# Patient Record
Sex: Female | Born: 1940 | Race: White | Hispanic: No | Marital: Married | Smoking: Former smoker
Health system: Southern US, Community
[De-identification: ages and names within clinical notes are randomized; demographics above are authoritative.]

## PROBLEM LIST (undated history)

## (undated) DIAGNOSIS — I10 Essential (primary) hypertension: Secondary | ICD-10-CM

## (undated) DIAGNOSIS — E119 Type 2 diabetes mellitus without complications: Secondary | ICD-10-CM

## (undated) DIAGNOSIS — Z8619 Personal history of other infectious and parasitic diseases: Secondary | ICD-10-CM

## (undated) DIAGNOSIS — D649 Anemia, unspecified: Secondary | ICD-10-CM

## (undated) DIAGNOSIS — K635 Polyp of colon: Secondary | ICD-10-CM

## (undated) DIAGNOSIS — F329 Major depressive disorder, single episode, unspecified: Secondary | ICD-10-CM

## (undated) DIAGNOSIS — R911 Solitary pulmonary nodule: Secondary | ICD-10-CM

## (undated) DIAGNOSIS — L570 Actinic keratosis: Secondary | ICD-10-CM

## (undated) DIAGNOSIS — C44621 Squamous cell carcinoma of skin of unspecified upper limb, including shoulder: Secondary | ICD-10-CM

## (undated) DIAGNOSIS — K219 Gastro-esophageal reflux disease without esophagitis: Secondary | ICD-10-CM

## (undated) DIAGNOSIS — K649 Unspecified hemorrhoids: Secondary | ICD-10-CM

## (undated) DIAGNOSIS — J449 Chronic obstructive pulmonary disease, unspecified: Secondary | ICD-10-CM

## (undated) DIAGNOSIS — C44629 Squamous cell carcinoma of skin of left upper limb, including shoulder: Secondary | ICD-10-CM

## (undated) HISTORY — DX: Anemia, unspecified: D64.9

## (undated) HISTORY — DX: Polyp of colon: K63.5

## (undated) HISTORY — DX: Actinic keratosis: L57.0

## (undated) HISTORY — DX: Unspecified hemorrhoids: K64.9

## (undated) HISTORY — DX: Chronic obstructive pulmonary disease, unspecified: J44.9

## (undated) HISTORY — PX: OTHER SURGICAL HISTORY: SHX169

## (undated) HISTORY — DX: Major depressive disorder, single episode, unspecified: F32.9

## (undated) HISTORY — DX: Gastro-esophageal reflux disease without esophagitis: K21.9

## (undated) HISTORY — DX: Personal history of other infectious and parasitic diseases: Z86.19

## (undated) HISTORY — DX: Squamous cell carcinoma of skin of unspecified upper limb, including shoulder: C44.621

## (undated) HISTORY — DX: Squamous cell carcinoma of skin of left upper limb, including shoulder: C44.629

## (undated) HISTORY — DX: Solitary pulmonary nodule: R91.1

---

## 1969-05-18 HISTORY — PX: TUBAL LIGATION: SHX77

## 1983-05-19 HISTORY — PX: ABDOMINAL HYSTERECTOMY: SHX81

## 1992-05-18 HISTORY — PX: GALLBLADDER SURGERY: SHX652

## 1996-05-18 HISTORY — PX: NECK SURGERY: SHX720

## 2007-07-08 DIAGNOSIS — F329 Major depressive disorder, single episode, unspecified: Secondary | ICD-10-CM | POA: Insufficient documentation

## 2007-07-08 DIAGNOSIS — F32A Depression, unspecified: Secondary | ICD-10-CM

## 2007-07-08 DIAGNOSIS — I10 Essential (primary) hypertension: Secondary | ICD-10-CM | POA: Insufficient documentation

## 2007-07-08 DIAGNOSIS — M519 Unspecified thoracic, thoracolumbar and lumbosacral intervertebral disc disorder: Secondary | ICD-10-CM | POA: Insufficient documentation

## 2007-07-08 DIAGNOSIS — F17210 Nicotine dependence, cigarettes, uncomplicated: Secondary | ICD-10-CM | POA: Insufficient documentation

## 2007-07-08 HISTORY — DX: Depression, unspecified: F32.A

## 2007-07-08 HISTORY — DX: Major depressive disorder, single episode, unspecified: F32.9

## 2007-07-14 ENCOUNTER — Ambulatory Visit: Payer: Self-pay | Admitting: Family Medicine

## 2007-11-10 ENCOUNTER — Ambulatory Visit: Payer: Self-pay | Admitting: Family Medicine

## 2008-02-16 HISTORY — PX: CARDIOVASCULAR STRESS TEST: SHX262

## 2008-03-15 ENCOUNTER — Ambulatory Visit: Payer: Self-pay | Admitting: Family Medicine

## 2008-05-18 HISTORY — PX: HAND SURGERY: SHX662

## 2008-07-05 ENCOUNTER — Ambulatory Visit: Payer: Self-pay | Admitting: Family Medicine

## 2008-07-19 ENCOUNTER — Ambulatory Visit: Payer: Self-pay | Admitting: Specialist

## 2009-07-16 HISTORY — PX: ESOPHAGOGASTRODUODENOSCOPY: SHX1529

## 2009-07-18 LAB — HM COLONOSCOPY

## 2009-08-15 ENCOUNTER — Ambulatory Visit: Payer: Self-pay | Admitting: Gastroenterology

## 2010-01-16 HISTORY — PX: DOPPLER ECHOCARDIOGRAPHY: SHX263

## 2010-02-07 ENCOUNTER — Ambulatory Visit: Payer: Self-pay | Admitting: Cardiovascular Disease

## 2010-02-07 ENCOUNTER — Ambulatory Visit: Payer: Self-pay

## 2010-06-16 ENCOUNTER — Ambulatory Visit: Payer: Self-pay | Admitting: Family Medicine

## 2010-07-30 ENCOUNTER — Ambulatory Visit: Payer: Self-pay | Admitting: Family Medicine

## 2010-08-14 ENCOUNTER — Ambulatory Visit: Payer: Self-pay | Admitting: Family Medicine

## 2010-08-17 ENCOUNTER — Ambulatory Visit: Payer: Self-pay | Admitting: Oncology

## 2010-09-09 LAB — CANCER ANTIGEN 19-9: CA 19-9: <1 U/mL (ref 0–35)

## 2010-09-16 ENCOUNTER — Ambulatory Visit: Payer: Self-pay | Admitting: Oncology

## 2010-12-02 ENCOUNTER — Ambulatory Visit: Payer: Self-pay | Admitting: Oncology

## 2010-12-04 ENCOUNTER — Ambulatory Visit: Payer: Self-pay | Admitting: Oncology

## 2010-12-17 ENCOUNTER — Ambulatory Visit: Payer: Self-pay | Admitting: Oncology

## 2011-02-10 ENCOUNTER — Ambulatory Visit: Payer: Self-pay | Admitting: Family Medicine

## 2011-02-10 LAB — HM DEXA SCAN

## 2012-01-01 ENCOUNTER — Ambulatory Visit: Payer: Self-pay | Admitting: Family Medicine

## 2012-01-06 ENCOUNTER — Ambulatory Visit: Payer: Self-pay | Admitting: Family Medicine

## 2012-10-02 ENCOUNTER — Other Ambulatory Visit: Payer: Self-pay | Admitting: Family Medicine

## 2012-10-10 ENCOUNTER — Other Ambulatory Visit: Payer: Self-pay | Admitting: Family Medicine

## 2013-10-27 DIAGNOSIS — E559 Vitamin D deficiency, unspecified: Secondary | ICD-10-CM | POA: Insufficient documentation

## 2014-04-28 ENCOUNTER — Emergency Department (HOSPITAL_COMMUNITY)
Admission: EM | Admit: 2014-04-28 | Discharge: 2014-04-28 | Disposition: A | Discharge status: Home / Self Care | Payer: Medicare Other | Attending: Emergency Medicine | Admitting: Emergency Medicine

## 2014-04-28 ENCOUNTER — Encounter (HOSPITAL_COMMUNITY): Payer: Self-pay

## 2014-04-28 ENCOUNTER — Emergency Department (HOSPITAL_COMMUNITY): Payer: Medicare Other

## 2014-04-28 DIAGNOSIS — M545 Low back pain, unspecified: Secondary | ICD-10-CM

## 2014-04-28 DIAGNOSIS — Y998 Other external cause status: Secondary | ICD-10-CM | POA: Insufficient documentation

## 2014-04-28 DIAGNOSIS — E785 Hyperlipidemia, unspecified: Secondary | ICD-10-CM | POA: Insufficient documentation

## 2014-04-28 DIAGNOSIS — Z720 Tobacco use: Secondary | ICD-10-CM | POA: Diagnosis not present

## 2014-04-28 DIAGNOSIS — W19XXXA Unspecified fall, initial encounter: Secondary | ICD-10-CM

## 2014-04-28 DIAGNOSIS — S3992XA Unspecified injury of lower back, initial encounter: Secondary | ICD-10-CM | POA: Insufficient documentation

## 2014-04-28 DIAGNOSIS — Z79899 Other long term (current) drug therapy: Secondary | ICD-10-CM | POA: Diagnosis not present

## 2014-04-28 DIAGNOSIS — Z7982 Long term (current) use of aspirin: Secondary | ICD-10-CM | POA: Diagnosis not present

## 2014-04-28 DIAGNOSIS — I10 Essential (primary) hypertension: Secondary | ICD-10-CM | POA: Diagnosis not present

## 2014-04-28 DIAGNOSIS — Y9289 Other specified places as the place of occurrence of the external cause: Secondary | ICD-10-CM | POA: Insufficient documentation

## 2014-04-28 DIAGNOSIS — W01198A Fall on same level from slipping, tripping and stumbling with subsequent striking against other object, initial encounter: Secondary | ICD-10-CM | POA: Insufficient documentation

## 2014-04-28 DIAGNOSIS — E119 Type 2 diabetes mellitus without complications: Secondary | ICD-10-CM | POA: Insufficient documentation

## 2014-04-28 DIAGNOSIS — Y9389 Activity, other specified: Secondary | ICD-10-CM | POA: Diagnosis not present

## 2014-04-28 HISTORY — DX: Essential (primary) hypertension: I10

## 2014-04-28 HISTORY — DX: Type 2 diabetes mellitus without complications: E11.9

## 2014-04-28 LAB — URINALYSIS, ROUTINE W REFLEX MICROSCOPIC
BILIRUBIN URINE: NEGATIVE
Glucose, UA: NEGATIVE mg/dL
Hgb urine dipstick: NEGATIVE
Ketones, ur: NEGATIVE mg/dL
LEUKOCYTES UA: NEGATIVE
NITRITE: NEGATIVE
PH: 5.5 (ref 5.0–8.0)
Protein, ur: NEGATIVE mg/dL
SPECIFIC GRAVITY, URINE: 1.02 (ref 1.005–1.030)
UROBILINOGEN UA: 0.2 mg/dL (ref 0.0–1.0)

## 2014-04-28 MED ORDER — HYDROCODONE-ACETAMINOPHEN 5-325 MG PO TABS
1.0000 | ORAL_TABLET | Freq: Once | ORAL | Status: AC
  Administered 2014-04-28: 1 via ORAL
  Filled 2014-04-28: qty 1

## 2014-04-28 MED ORDER — HYDROCODONE-ACETAMINOPHEN 5-325 MG PO TABS: 2.0000 | ORAL_TABLET | ORAL | Status: DC | PRN

## 2014-04-28 NOTE — ED Notes (Signed)
Ambulated patient. Tolerated well. Stated that she felt fine while walking.

## 2014-04-28 NOTE — ED Notes (Signed)
Pt ambulated 20-30 feet. Asymptomatic. Marland Kitchen

## 2014-04-28 NOTE — Discharge Instructions (Signed)
Fall Prevention and Home Safety Your Xray shows a small compression in your back.  Follow up with your doctor. Return to the ED if you develop new or worsening symptoms. Falls cause injuries and can affect all age groups. It is possible to use preventive measures to significantly decrease the likelihood of falls. There are many simple measures which can make your home safer and prevent falls. OUTDOORS  Repair cracks and edges of walkways and driveways.  Remove high doorway thresholds.  Trim shrubbery on the main path into your home.  Have good outside lighting.  Clear walkways of tools, rocks, debris, and clutter.  Check that handrails are not broken and are securely fastened. Both sides of steps should have handrails.  Have leaves, snow, and ice cleared regularly.  Use sand or salt on walkways during winter months.  In the garage, clean up grease or oil spills. BATHROOM  Install night lights.  Install grab bars by the toilet and in the tub and shower.  Use non-skid mats or decals in the tub or shower.  Place a plastic non-slip stool in the shower to sit on, if needed.  Keep floors dry and clean up all water on the floor immediately.  Remove soap buildup in the tub or shower on a regular basis.  Secure bath mats with non-slip, double-sided rug tape.  Remove throw rugs and tripping hazards from the floors. BEDROOMS  Install night lights.  Make sure a bedside light is easy to reach.  Do not use oversized bedding.  Keep a telephone by your bedside.  Have a firm chair with side arms to use for getting dressed.  Remove throw rugs and tripping hazards from the floor. KITCHEN  Keep handles on pots and pans turned toward the center of the stove. Use back burners when possible.  Clean up spills quickly and allow time for drying.  Avoid walking on wet floors.  Avoid hot utensils and knives.  Position shelves so they are not too high or low.  Place commonly used  objects within easy reach.  If necessary, use a sturdy step stool with a grab bar when reaching.  Keep electrical cables out of the way.  Do not use floor polish or wax that makes floors slippery. If you must use wax, use non-skid floor wax.  Remove throw rugs and tripping hazards from the floor. STAIRWAYS  Never leave objects on stairs.  Place handrails on both sides of stairways and use them. Fix any loose handrails. Make sure handrails on both sides of the stairways are as long as the stairs.  Check carpeting to make sure it is firmly attached along stairs. Make repairs to worn or loose carpet promptly.  Avoid placing throw rugs at the top or bottom of stairways, or properly secure the rug with carpet tape to prevent slippage. Get rid of throw rugs, if possible.  Have an electrician put in a light switch at the top and bottom of the stairs. OTHER FALL PREVENTION TIPS  Wear low-heel or rubber-soled shoes that are supportive and fit well. Wear closed toe shoes.  When using a stepladder, make sure it is fully opened and both spreaders are firmly locked. Do not climb a closed stepladder.  Add color or contrast paint or tape to grab bars and handrails in your home. Place contrasting color strips on first and last steps.  Learn and use mobility aids as needed. Install an electrical emergency response system.  Turn on lights to avoid dark  areas. Replace light bulbs that burn out immediately. Get light switches that glow.  Arrange furniture to create clear pathways. Keep furniture in the same place.  Firmly attach carpet with non-skid or double-sided tape.  Eliminate uneven floor surfaces.  Select a carpet pattern that does not visually hide the edge of steps.  Be aware of all pets. OTHER HOME SAFETY TIPS  Set the water temperature for 120 F (48.8 C).  Keep emergency numbers on or near the telephone.  Keep smoke detectors on every level of the home and near sleeping  areas. Document Released: 04/24/2002 Document Revised: 11/03/2011 Document Reviewed: 07/24/2011 [facility] Patient Information 2015 [place], [place]. This information is not intended to replace advice given to you by your health care provider. Make sure you discuss any questions you have with your health care provider.

## 2014-04-28 NOTE — ED Notes (Signed)
Pt was at an [employer], had taken her things out to her car and when she returned, she turned around quick and lost her balance and fell.  Pt c/o pain to left hip and under left posterior ribcage.  Pt denies hitting her head or having loc.

## 2014-04-28 NOTE — ED Provider Notes (Signed)
CSN: [id number]     Arrival date & time 04/28/14  2040 History   First MD Initiated Contact with Patient 04/28/14 2100     Chief Complaint  Patient presents with  . Fall     (Consider location/radiation/quality/duration/timing/severity/associated sxs/prior Treatment) HPI Comments: Patient complains of left sided hip pain after fall. She states she lost her balance and fell as she was trying to pick up a piece of trash. She hit her left hip onto a table and then onto the ground. Denies hitting her head or losing consciousness. Denies any dizziness or syncope. Denies any chest pain or shortness of breath. Complains of pain to left low back and left iliac crest. Pain does not radiate down her leg. No incontinence. No weakness numbness or tingling. No chest pain or abdominal pain. No head or neck pain. She is not taking anything for pain.   The history is provided by the patient and the EMS personnel.    Past Medical History  Diagnosis Date  . Hypertension   . Diabetes mellitus without complication   . Hyperlipidemia    History reviewed. No pertinent past surgical history. No family history on file. History  Substance Use Topics  . Smoking status: Current Every Day Smoker  . Smokeless tobacco: Not on file  . Alcohol Use: No   OB History    No data available     Review of Systems  Constitutional: Negative for fever, activity change and appetite change.  Respiratory: Negative for cough, chest tightness and shortness of breath.   Gastrointestinal: Negative for nausea, vomiting and abdominal pain.  Genitourinary: Negative for dysuria, hematuria, vaginal bleeding and vaginal discharge.  Musculoskeletal: Positive for myalgias, back pain and arthralgias. Negative for neck pain and neck stiffness.  Skin: Negative for wound.  Neurological: Negative for dizziness, weakness and headaches.  A complete 10 system review of systems was obtained and all systems are negative except as noted in  the HPI and PMH.      Allergies  Review of patient's allergies indicates no known allergies.  Home Medications   Prior to Admission medications   Medication Sig Start Date End Date Taking? Authorizing Provider  aspirin EC 81 MG tablet Take 81 mg by mouth daily.   Yes Historical Provider, MD  atenolol (TENORMIN) 50 MG tablet Take 50 mg by mouth 2 (two) times daily. 02/13/14  Yes Historical Provider, MD  atorvastatin (LIPITOR) 10 MG tablet Take 10 mg by mouth at bedtime. 02/26/14  Yes Historical Provider, MD  Cholecalciferol (VITAMIN D) 2000 UNITS tablet Take 2,000 Units by mouth daily.   Yes Historical Provider, MD  Enalapril-Hydrochlorothiazide 5-12.5 MG per tablet Take 1 tablet by mouth daily. 04/20/14  Yes Historical Provider, MD  glipiZIDE (GLUCOTROL XL) 10 MG 24 hr tablet Take 10 mg by mouth daily. 02/26/14  Yes Historical Provider, MD  metFORMIN (GLUCOPHAGE) 1000 MG tablet Take 1,000 mg by mouth 2 (two) times daily. 02/19/14  Yes Historical Provider, MD  omeprazole (PRILOSEC) 20 MG capsule Take 20 mg by mouth 2 (two) times daily. 02/12/14  Yes Historical Provider, MD  oxyCODONE-acetaminophen (PERCOCET/ROXICET) 5-325 MG per tablet Take 1 tablet by mouth 2 (two) times daily as needed. For pain 02/17/14  Yes Historical Provider, MD  PARoxetine (PAXIL) 10 MG tablet Take 10 mg by mouth daily. Takes a total of 30mg  daily 03/22/14  Yes Historical Provider, MD  PARoxetine (PAXIL) 20 MG tablet Take 20 mg by mouth daily. 03/22/14  Yes Historical Provider, MD  PROAIR HFA 108 (90 BASE) MCG/ACT inhaler Inhale 2 puffs into the lungs every 6 (six) hours as needed for wheezing or shortness of breath.  04/04/14  Yes Historical Provider, MD  SYMBICORT 160-4.5 MCG/ACT inhaler Inhale 2 puffs into the lungs 2 (two) times daily. 04/04/14  Yes Historical Provider, MD  HYDROcodone-acetaminophen (NORCO/VICODIN) 5-325 MG per tablet Take 2 tablets by mouth every 4 (four) hours as needed. 04/28/14   Ezequiel Essex, MD    BP 155/79 mmHg  Pulse 97  Temp(Src) 98.7 F (37.1 C) (Oral)  Resp 18  Ht 5\' 7"  (1.702 m)  Wt 183 lb (83.008 kg)  BMI 28.66 kg/m2  SpO2 95% Physical Exam  Constitutional: She is oriented to person, place, and time. She appears well-developed and well-nourished. No distress.  HENT:  Head: Normocephalic and atraumatic.  Mouth/Throat: Oropharynx is clear and moist. No oropharyngeal exudate.  Eyes: Conjunctivae and EOM are normal. Pupils are equal, round, and reactive to light.  Neck: Normal range of motion. Neck supple.  No C spine tenderness  Cardiovascular: Normal rate, regular rhythm, normal heart sounds and intact distal pulses.   No murmur heard. Pulmonary/Chest: Effort normal and breath sounds normal. No respiratory distress.  Abdominal: Soft. There is no tenderness. There is no rebound and no guarding.  Musculoskeletal: Normal range of motion. She exhibits tenderness. She exhibits no edema.  TTP L iliac crest, lumbar spine. No stepoff, Intact distal pulses 5/5 strength in bilateral lower extremities. Ankle plantar and dorsiflexion intact. Great toe extension intact bilaterally. +2 DP and PT pulses. +2 patellar reflexes bilaterally. Normal gait.   Neurological: She is alert and oriented to person, place, and time. No cranial nerve deficit. She exhibits normal muscle tone. Coordination normal.  No ataxia on finger to nose bilaterally. No pronator drift. 5/5 strength throughout. CN 2-12 intact. Negative Romberg. Equal grip strength. Sensation intact. Gait is normal.   Skin: Skin is warm.  Psychiatric: She has a normal mood and affect. Her behavior is normal.  Nursing note and vitals reviewed.   ED Course  Procedures (including critical care time) Labs Review Labs Reviewed  URINALYSIS, ROUTINE W REFLEX MICROSCOPIC - Abnormal; Notable for the following:    APPearance CLOUDY (*)    All other components within normal limits    Imaging Review Dg Chest 2 View  04/28/2014    CLINICAL DATA:  [place] tonight around 8 p.m. while retrieving things from patient's car, low back, LEFT hip and chest pain.  EXAM: CHEST  2 VIEW  COMPARISON:  None.  FINDINGS: The cardiac silhouette is upper limits of normal in size, mediastinal silhouette is unremarkable. No pleural effusions or focal consolidations no pneumothorax. Soft tissue planes and included osseous structures are nonsuspicious. Chronic appearing RIGHT fourth and fifth lateral rib fractures.  IMPRESSION: Borderline cardiomegaly, no acute pulmonary process.   Electronically Signed   By: Elon Alas   On: 04/28/2014 21:54   Dg Lumbar Spine Complete  04/28/2014   CLINICAL DATA:  [place] tonight around 8 p.m. while retrieving things from patient's car, low back, LEFT hip and chest pain.  EXAM: LUMBAR SPINE - COMPLETE 4+ VIEW  COMPARISON:  None.  FINDINGS: Mild wedging of vertebral body L1. The remaining lumbar vertebral bodies appear intact. Scattered Schmorl's nodes. At least moderate L4-5 and L5-S1 degenerative discs. Mild lower lumbar facet arthropathy. No pars interarticularis defects. No destructive bony lesions. Surgical clips in the included right abdomen likely reflect cholecystectomy. Moderate aortoiliac atherosclerosis calcifications.  IMPRESSION: Mild wedging  of vertebral body L1 could reflect an acute injury, no retropulsed bony fragments or malalignment.   Electronically Signed   By: Elon Alas   On: 04/28/2014 21:53   Dg Hip Complete Left  04/28/2014   CLINICAL DATA:  [place] tonight with left hip pain, initial encounter  EXAM: LEFT HIP - COMPLETE 2+ VIEW  COMPARISON:  None.  FINDINGS: Mild degenerative changes of the hip joint are noted. No acute fracture or dislocation is seen. No gross soft tissue abnormality is noted.  IMPRESSION: No acute abnormality seen.   Electronically Signed   By: Inez Catalina M.D.   On: 04/28/2014 21:45     EKG Interpretation   Date/Time:  Saturday April 28 2014 21:15:49  EST Ventricular Rate:  84 PR Interval:  200 QRS Duration: 86 QT Interval:  377 QTC Calculation: 446 R Axis:   52 Text Interpretation:  Sinus rhythm No previous ECGs available Confirmed by  Jonea Bukowski  MD, Yalissa Fink [phone]) on 04/28/2014 9:51:06 PM      MDM   Final diagnoses:  Fall  Midline low back pain without sciatica   Mechanical fall with L sided back and hip pain. Did not hit head or lose consciousness. No chest pain or shortness of breath. No syncope.  Neurologically intact.   Xrays negative for fracture. Except mild L1 wedge deformity, uncertain chroncity. Patient ambulatory.  Tolerating PO.  No dizziness or lightheadedness with standing.   Patient states she has been told she had compression in her back previously. Pain controlled in the ED. Treat pain. Follow-up with PCP. Return precautions discussed.  I personally performed the services described in this documentation, which was scribed in my presence. The recorded information has been reviewed and is accurate.   Ezequiel Essex, MD 04/28/14 [phone]

## 2014-06-28 DIAGNOSIS — E1140 Type 2 diabetes mellitus with diabetic neuropathy, unspecified: Secondary | ICD-10-CM | POA: Diagnosis not present

## 2014-06-28 DIAGNOSIS — Z720 Tobacco use: Secondary | ICD-10-CM | POA: Diagnosis not present

## 2014-06-28 DIAGNOSIS — M519 Unspecified thoracic, thoracolumbar and lumbosacral intervertebral disc disorder: Secondary | ICD-10-CM | POA: Diagnosis not present

## 2014-06-28 DIAGNOSIS — J449 Chronic obstructive pulmonary disease, unspecified: Secondary | ICD-10-CM | POA: Diagnosis not present

## 2014-06-28 DIAGNOSIS — I10 Essential (primary) hypertension: Secondary | ICD-10-CM | POA: Diagnosis not present

## 2014-06-28 LAB — HEMOGLOBIN A1C: HEMOGLOBIN A1C: 6.5 % — AB (ref 4.0–6.0)

## 2014-07-26 ENCOUNTER — Ambulatory Visit: Payer: Self-pay | Admitting: Family Medicine

## 2014-07-26 DIAGNOSIS — R0602 Shortness of breath: Secondary | ICD-10-CM | POA: Diagnosis not present

## 2014-07-26 DIAGNOSIS — R05 Cough: Secondary | ICD-10-CM | POA: Diagnosis not present

## 2014-07-26 DIAGNOSIS — J440 Chronic obstructive pulmonary disease with acute lower respiratory infection: Secondary | ICD-10-CM | POA: Diagnosis not present

## 2014-07-26 DIAGNOSIS — J449 Chronic obstructive pulmonary disease, unspecified: Secondary | ICD-10-CM | POA: Diagnosis not present

## 2014-07-26 DIAGNOSIS — H6591 Unspecified nonsuppurative otitis media, right ear: Secondary | ICD-10-CM | POA: Diagnosis not present

## 2014-08-02 DIAGNOSIS — H6591 Unspecified nonsuppurative otitis media, right ear: Secondary | ICD-10-CM | POA: Diagnosis not present

## 2014-08-02 DIAGNOSIS — D72828 Other elevated white blood cell count: Secondary | ICD-10-CM | POA: Diagnosis not present

## 2014-08-02 DIAGNOSIS — J440 Chronic obstructive pulmonary disease with acute lower respiratory infection: Secondary | ICD-10-CM | POA: Diagnosis not present

## 2014-08-02 DIAGNOSIS — J449 Chronic obstructive pulmonary disease, unspecified: Secondary | ICD-10-CM | POA: Diagnosis not present

## 2014-08-10 DIAGNOSIS — J449 Chronic obstructive pulmonary disease, unspecified: Secondary | ICD-10-CM | POA: Diagnosis not present

## 2014-08-10 DIAGNOSIS — J440 Chronic obstructive pulmonary disease with acute lower respiratory infection: Secondary | ICD-10-CM | POA: Diagnosis not present

## 2014-08-10 DIAGNOSIS — D649 Anemia, unspecified: Secondary | ICD-10-CM | POA: Diagnosis not present

## 2014-08-10 DIAGNOSIS — R634 Abnormal weight loss: Secondary | ICD-10-CM | POA: Diagnosis not present

## 2014-08-10 DIAGNOSIS — D72828 Other elevated white blood cell count: Secondary | ICD-10-CM | POA: Diagnosis not present

## 2014-08-31 DIAGNOSIS — D72828 Other elevated white blood cell count: Secondary | ICD-10-CM | POA: Diagnosis not present

## 2014-08-31 LAB — BASIC METABOLIC PANEL
BUN: 20 mg/dL (ref 4–21)
CREATININE: 1.4 mg/dL — AB (ref 0.5–1.1)
Glucose: 148 mg/dL
POTASSIUM: 4.6 mmol/L (ref 3.4–5.3)
SODIUM: 139 mmol/L (ref 137–147)

## 2014-08-31 LAB — HEPATIC FUNCTION PANEL
ALT: 14 U/L (ref 7–35)
AST: 18 U/L (ref 13–35)

## 2014-10-08 DIAGNOSIS — E1149 Type 2 diabetes mellitus with other diabetic neurological complication: Secondary | ICD-10-CM | POA: Diagnosis not present

## 2014-10-08 DIAGNOSIS — D649 Anemia, unspecified: Secondary | ICD-10-CM | POA: Diagnosis not present

## 2014-10-08 DIAGNOSIS — R634 Abnormal weight loss: Secondary | ICD-10-CM | POA: Diagnosis not present

## 2014-10-08 DIAGNOSIS — M519 Unspecified thoracic, thoracolumbar and lumbosacral intervertebral disc disorder: Secondary | ICD-10-CM | POA: Diagnosis not present

## 2014-10-08 DIAGNOSIS — G4700 Insomnia, unspecified: Secondary | ICD-10-CM | POA: Diagnosis not present

## 2014-10-08 DIAGNOSIS — J449 Chronic obstructive pulmonary disease, unspecified: Secondary | ICD-10-CM | POA: Diagnosis not present

## 2014-10-08 LAB — CBC AND DIFFERENTIAL
HCT: 30 % — AB (ref 36–46)
HEMOGLOBIN: 9.7 g/dL — AB (ref 12.0–16.0)
Platelets: 325 10*3/uL (ref 150–399)
WBC: 13.5 10^3/mL

## 2014-10-25 ENCOUNTER — Other Ambulatory Visit: Payer: Self-pay | Admitting: *Deleted

## 2014-10-26 ENCOUNTER — Other Ambulatory Visit: Payer: Self-pay | Admitting: Family Medicine

## 2014-10-26 DIAGNOSIS — Z1231 Encounter for screening mammogram for malignant neoplasm of breast: Secondary | ICD-10-CM

## 2014-10-30 ENCOUNTER — Ambulatory Visit
Admission: RE | Admit: 2014-10-30 | Discharge: 2014-10-30 | Disposition: A | Discharge status: Home / Self Care | Payer: Medicare Other | Source: Ambulatory Visit | Attending: Family Medicine | Admitting: Family Medicine

## 2014-10-30 ENCOUNTER — Other Ambulatory Visit: Payer: Self-pay | Admitting: Family Medicine

## 2014-10-30 DIAGNOSIS — R921 Mammographic calcification found on diagnostic imaging of breast: Secondary | ICD-10-CM

## 2014-10-30 DIAGNOSIS — R928 Other abnormal and inconclusive findings on diagnostic imaging of breast: Secondary | ICD-10-CM

## 2014-10-30 DIAGNOSIS — Z1231 Encounter for screening mammogram for malignant neoplasm of breast: Secondary | ICD-10-CM | POA: Diagnosis present

## 2014-10-30 DIAGNOSIS — N63 Unspecified lump in unspecified breast: Secondary | ICD-10-CM

## 2014-11-04 ENCOUNTER — Telehealth: Payer: Self-pay | Admitting: Family Medicine

## 2014-11-04 DIAGNOSIS — D519 Vitamin B12 deficiency anemia, unspecified: Secondary | ICD-10-CM | POA: Insufficient documentation

## 2014-11-04 NOTE — Telephone Encounter (Signed)
Please advise patient it is time to recheck CBC and vitamin B12 level. Please advise and print order to pick up at front desk.

## 2014-11-05 ENCOUNTER — Other Ambulatory Visit: Payer: Self-pay | Admitting: Family Medicine

## 2014-11-05 ENCOUNTER — Ambulatory Visit: Admission: RE | Admit: 2014-11-05 | Payer: Medicare Other | Source: Ambulatory Visit

## 2014-11-05 ENCOUNTER — Ambulatory Visit
Admission: RE | Admit: 2014-11-05 | Discharge: 2014-11-05 | Disposition: A | Discharge status: Home / Self Care | Payer: Medicare Other | Source: Ambulatory Visit | Attending: Family Medicine | Admitting: Family Medicine

## 2014-11-05 DIAGNOSIS — R921 Mammographic calcification found on diagnostic imaging of breast: Secondary | ICD-10-CM

## 2014-11-05 DIAGNOSIS — N63 Unspecified lump in unspecified breast: Secondary | ICD-10-CM

## 2014-11-05 DIAGNOSIS — R928 Other abnormal and inconclusive findings on diagnostic imaging of breast: Secondary | ICD-10-CM

## 2014-11-06 ENCOUNTER — Other Ambulatory Visit: Payer: Self-pay | Admitting: Family Medicine

## 2014-11-06 DIAGNOSIS — R921 Mammographic calcification found on diagnostic imaging of breast: Secondary | ICD-10-CM

## 2014-11-06 DIAGNOSIS — R928 Other abnormal and inconclusive findings on diagnostic imaging of breast: Secondary | ICD-10-CM

## 2014-11-06 NOTE — Telephone Encounter (Signed)
Pt advised, and lab sheet is at the front desk.  Thanks,   -Mickel Baas

## 2014-11-08 LAB — CBC WITH DIFFERENTIAL/PLATELET
BASOS: 0 %
Basophils Absolute: 0 10*3/uL (ref 0.0–0.2)
EOS (ABSOLUTE): 0.4 10*3/uL (ref 0.0–0.4)
Eos: 4 %
HEMATOCRIT: 31.2 % — AB (ref 34.0–46.6)
HEMOGLOBIN: 10.5 g/dL — AB (ref 11.1–15.9)
Immature Grans (Abs): 0 10*3/uL (ref 0.0–0.1)
Immature Granulocytes: 0 %
LYMPHS ABS: 4.7 10*3/uL — AB (ref 0.7–3.1)
LYMPHS: 37 %
MCH: 27.4 pg (ref 26.6–33.0)
MCHC: 33.7 g/dL (ref 31.5–35.7)
MCV: 82 fL (ref 79–97)
Monocytes Absolute: 0.8 10*3/uL (ref 0.1–0.9)
Monocytes: 6 %
NEUTROS ABS: 6.5 10*3/uL (ref 1.4–7.0)
Neutrophils: 53 %
Platelets: 295 10*3/uL (ref 150–379)
RBC: 3.83 x10E6/uL (ref 3.77–5.28)
RDW: 14.4 % (ref 12.3–15.4)
WBC: 12.4 10*3/uL — ABNORMAL HIGH (ref 3.4–10.8)

## 2014-11-08 LAB — VITAMIN B12: Vitamin B-12: 1368 pg/mL — ABNORMAL HIGH (ref 211–946)

## 2014-11-12 ENCOUNTER — Ambulatory Visit
Admission: RE | Admit: 2014-11-12 | Discharge: 2014-11-12 | Disposition: A | Discharge status: Home / Self Care | Payer: Medicare Other | Source: Ambulatory Visit | Attending: Family Medicine | Admitting: Family Medicine

## 2014-11-12 ENCOUNTER — Other Ambulatory Visit: Payer: Self-pay | Admitting: Family Medicine

## 2014-11-12 DIAGNOSIS — R928 Other abnormal and inconclusive findings on diagnostic imaging of breast: Secondary | ICD-10-CM

## 2014-11-12 DIAGNOSIS — R921 Mammographic calcification found on diagnostic imaging of breast: Secondary | ICD-10-CM | POA: Insufficient documentation

## 2014-11-12 HISTORY — PX: BREAST BIOPSY: SHX20

## 2014-11-13 LAB — SURGICAL PATHOLOGY

## 2014-11-15 ENCOUNTER — Telehealth: Payer: Self-pay | Admitting: Family Medicine

## 2014-11-15 NOTE — Telephone Encounter (Signed)
Lab results in chart waiting to be reviewed by Dr. Caryn Section.

## 2014-11-15 NOTE — Telephone Encounter (Signed)
Patient advised and verbalized understanding 

## 2014-11-15 NOTE — Telephone Encounter (Signed)
Is slightly anemia. B12 level is very good. Continue current medications.

## 2014-11-15 NOTE — Telephone Encounter (Signed)
Pt called wanting lab results from last week.  Please call [phone]  thanks tp

## 2014-12-14 ENCOUNTER — Other Ambulatory Visit: Payer: Self-pay | Admitting: Family Medicine

## 2014-12-16 ENCOUNTER — Other Ambulatory Visit: Payer: Self-pay | Admitting: Family Medicine

## 2014-12-18 ENCOUNTER — Other Ambulatory Visit: Payer: Self-pay | Admitting: Family Medicine

## 2015-02-07 ENCOUNTER — Other Ambulatory Visit: Payer: Self-pay | Admitting: *Deleted

## 2015-02-07 MED ORDER — METFORMIN HCL 1000 MG PO TABS: 1000.0000 mg | ORAL_TABLET | Freq: Two times a day (BID) | ORAL | Status: DC

## 2015-02-07 MED ORDER — GLIPIZIDE ER 10 MG PO TB24: 10.0000 mg | ORAL_TABLET | Freq: Every day | ORAL | Status: DC

## 2015-02-07 MED ORDER — ATENOLOL 50 MG PO TABS: 50.0000 mg | ORAL_TABLET | Freq: Two times a day (BID) | ORAL | Status: DC

## 2015-02-08 ENCOUNTER — Other Ambulatory Visit: Payer: Self-pay | Admitting: *Deleted

## 2015-02-08 MED ORDER — GLIPIZIDE ER 10 MG PO TB24: 10.0000 mg | ORAL_TABLET | Freq: Every day | ORAL | Status: DC

## 2015-02-08 MED ORDER — ATENOLOL 50 MG PO TABS: 50.0000 mg | ORAL_TABLET | Freq: Two times a day (BID) | ORAL | Status: DC

## 2015-02-08 MED ORDER — METFORMIN HCL 1000 MG PO TABS: 1000.0000 mg | ORAL_TABLET | Freq: Two times a day (BID) | ORAL | Status: DC

## 2015-02-11 ENCOUNTER — Other Ambulatory Visit: Payer: Self-pay | Admitting: Family Medicine

## 2015-02-11 NOTE — Telephone Encounter (Signed)
[facility] faxed refill request for Enalapril-Hydrochlorothiazide 5-12.5 MG per tablet. Thanks TNP

## 2015-02-11 NOTE — Telephone Encounter (Signed)
[facility] also request refill for Paroxetine. Patient has 2 different doses in her chart and both are inactive in [employer]. [facility] did not specify what dose on their request.

## 2015-02-18 ENCOUNTER — Other Ambulatory Visit: Payer: Self-pay | Admitting: Family Medicine

## 2015-02-18 ENCOUNTER — Ambulatory Visit (INDEPENDENT_AMBULATORY_CARE_PROVIDER_SITE_OTHER): Payer: Medicare Other | Admitting: Family Medicine

## 2015-02-18 ENCOUNTER — Encounter: Payer: Self-pay | Admitting: Family Medicine

## 2015-02-18 VITALS — BP 146/66 | HR 74 | Temp 98.5°F | Resp 16 | Ht 67.0 in | Wt 150.0 lb

## 2015-02-18 DIAGNOSIS — R911 Solitary pulmonary nodule: Secondary | ICD-10-CM

## 2015-02-18 DIAGNOSIS — F419 Anxiety disorder, unspecified: Secondary | ICD-10-CM | POA: Insufficient documentation

## 2015-02-18 DIAGNOSIS — K8689 Other specified diseases of pancreas: Secondary | ICD-10-CM | POA: Insufficient documentation

## 2015-02-18 DIAGNOSIS — F32A Depression, unspecified: Secondary | ICD-10-CM

## 2015-02-18 DIAGNOSIS — D519 Vitamin B12 deficiency anemia, unspecified: Secondary | ICD-10-CM

## 2015-02-18 DIAGNOSIS — F329 Major depressive disorder, single episode, unspecified: Secondary | ICD-10-CM

## 2015-02-18 DIAGNOSIS — E1149 Type 2 diabetes mellitus with other diabetic neurological complication: Secondary | ICD-10-CM

## 2015-02-18 DIAGNOSIS — N183 Chronic kidney disease, stage 3 unspecified: Secondary | ICD-10-CM

## 2015-02-18 DIAGNOSIS — E559 Vitamin D deficiency, unspecified: Secondary | ICD-10-CM | POA: Diagnosis not present

## 2015-02-18 DIAGNOSIS — G4700 Insomnia, unspecified: Secondary | ICD-10-CM | POA: Insufficient documentation

## 2015-02-18 DIAGNOSIS — M8580 Other specified disorders of bone density and structure, unspecified site: Secondary | ICD-10-CM

## 2015-02-18 DIAGNOSIS — J309 Allergic rhinitis, unspecified: Secondary | ICD-10-CM | POA: Insufficient documentation

## 2015-02-18 DIAGNOSIS — Z23 Encounter for immunization: Secondary | ICD-10-CM

## 2015-02-18 DIAGNOSIS — Z720 Tobacco use: Secondary | ICD-10-CM

## 2015-02-18 DIAGNOSIS — I77810 Thoracic aortic ectasia: Secondary | ICD-10-CM | POA: Insufficient documentation

## 2015-02-18 DIAGNOSIS — I5189 Other ill-defined heart diseases: Secondary | ICD-10-CM | POA: Insufficient documentation

## 2015-02-18 DIAGNOSIS — D72829 Elevated white blood cell count, unspecified: Secondary | ICD-10-CM | POA: Insufficient documentation

## 2015-02-18 DIAGNOSIS — E2839 Other primary ovarian failure: Secondary | ICD-10-CM

## 2015-02-18 DIAGNOSIS — N1832 Chronic kidney disease, stage 3b: Secondary | ICD-10-CM | POA: Insufficient documentation

## 2015-02-18 DIAGNOSIS — J440 Chronic obstructive pulmonary disease with acute lower respiratory infection: Secondary | ICD-10-CM | POA: Insufficient documentation

## 2015-02-18 DIAGNOSIS — R634 Abnormal weight loss: Secondary | ICD-10-CM | POA: Insufficient documentation

## 2015-02-18 DIAGNOSIS — R609 Edema, unspecified: Secondary | ICD-10-CM | POA: Insufficient documentation

## 2015-02-18 HISTORY — DX: Solitary pulmonary nodule: R91.1

## 2015-02-18 LAB — POCT GLYCOSYLATED HEMOGLOBIN (HGB A1C)
Est. average glucose Bld gHb Est-mCnc: 120
Hemoglobin A1C: 5.8

## 2015-02-18 LAB — POCT UA - MICROALBUMIN: Microalbumin Ur, POC: 20 mg/L

## 2015-02-18 MED ORDER — OXYCODONE-ACETAMINOPHEN 5-325 MG PO TABS: 1.0000 | ORAL_TABLET | Freq: Two times a day (BID) | ORAL | Status: DC | PRN

## 2015-02-18 MED ORDER — METFORMIN HCL 1000 MG PO TABS: 1000.0000 mg | ORAL_TABLET | Freq: Every day | ORAL | Status: DC

## 2015-02-18 NOTE — Telephone Encounter (Signed)
Pt contacted office for refill request on the following medications:  Enalapril-Hydrochlorothiazide 5-12.5 MG.  [facility] mail order. CB#[phone]/MW

## 2015-02-18 NOTE — Patient Instructions (Signed)
Reduce metformin to '1000mg'$  ONCE a day (instead of twice a day)

## 2015-02-18 NOTE — Progress Notes (Signed)
Patient: Erica Rivas Female    DOB: 1941-02-26   74 y.o.   MRN: [id number] Visit Date: 02/18/2015  Today's Provider: Lelon Huh, MD   Chief Complaint  Patient presents with  . Diabetes  . Hypertension  . Anemia    Vitamin B12defiency Anemia  . Hyperlipidemia   Subjective:    HPI  Diabetes Mellitus Type II, Follow-up:   Lab Results  Component Value Date   HGBA1C 6.5* 06/28/2014   Last seen for diabetes 5 months ago.  Management since then includes  none. She reports good compliance with treatment. She is not having side effects.  Current symptoms include none and have been stable. Home blood sugar records: not being checked  Episodes of hypoglycemia? no   Current Insulin Regimen:  none Most Recent Eye Exam:  >1 year Weight trend: decreasing steadily Prior visit with dietician: no Current diet: in general, a "healthy" diet   Current exercise: no regular exercise  ------------------------------------------------------------------------   Hypertension, follow-up:  BP Readings from Last 3 Encounters:  02/18/15 146/66  04/28/14 155/79    She was last seen for hypertension 8 months ago.  BP at that visit was  124/100. Management since that visit includes increasing Enalapril to 2 tablets daily .She reports good compliance with treatment. She is not having side effects.  She is not exercising. She is adherent to low salt diet.   Outside blood pressures are  262'M systolic reading. She is experiencing none.  Patient denies chest pain, chest pressure/discomfort, claudication, dyspnea, exertional chest pressure/discomfort, fatigue, irregular heart beat, near-syncope, orthopnea and palpitations.   Cardiovascular risk factors include advanced age (older than 7 for men, 32 for women), diabetes mellitus, dyslipidemia, hypertension and sedentary lifestyle.  Use of agents associated with hypertension: NSAIDS.    ------------------------------------------------------------------------    Lipid/Cholesterol, Follow-up:   Last seen for this 8 months ago.  Management since that visit includes  none.  Last Lipid Panel: No results found for: CHOL, TRIG, HDL, CHOLHDL, VLDL, LDLCALC, LDLDIRECT  She reports good compliance with treatment. She is not having side effects.   Wt Readings from Last 3 Encounters:  02/18/15 150 lb (68.04 kg)  04/28/14 183 lb (83.008 kg)    ------------------------------------------------------------------------   Vitamin B12 Deficeincy:  Last office visit was 5 months ago and patient was advised to start OTC Vitamin B12 1,043mg daily. Patient reports food compliance with treatment and good tolerance.     No Known Allergies Previous Medications   AMITRIPTYLINE (ELAVIL) 25 MG TABLET    TAKE ONE TABLET BY MOUTH AT BEDTIME   ASPIRIN EC 81 MG TABLET    Take 81 mg by mouth daily.   ASPIRIN-ACETAMINOPHEN-CAFFEINE (EXCEDRIN MIGRAINE) 250-250-65 MG TABLET    Take 1 tablet by mouth daily as needed.   ATENOLOL (TENORMIN) 50 MG TABLET    Take 1 tablet (50 mg total) by mouth 2 (two) times daily.   ATORVASTATIN (LIPITOR) 10 MG TABLET    TAKE ONE TABLET BY MOUTH AT BEDTIME FOR CHOLESTEROL   CHOLECALCIFEROL (VITAMIN D) 2000 UNITS TABLET    Take 1,000 Units by mouth daily.    ENALAPRIL-HYDROCHLOROTHIAZIDE 5-12.5 MG PER TABLET    Take 1 tablet by mouth daily.   FUROSEMIDE (LASIX) 20 MG TABLET    Take 1 tablet by mouth daily as needed. For swelling   GLIPIZIDE (GLUCOTROL XL) 10 MG 24 HR TABLET    Take 1 tablet (10 mg total) by mouth daily.   LORAZEPAM (  ATIVAN) 0.5 MG TABLET    Take 0.5 tablets by mouth at bedtime as needed.   METFORMIN (GLUCOPHAGE) 1000 MG TABLET    Take 1 tablet (1,000 mg total) by mouth 2 (two) times daily.   OMEPRAZOLE (PRILOSEC) 20 MG CAPSULE    Take 20 mg by mouth 2 (two) times daily.   OXYCODONE-ACETAMINOPHEN (PERCOCET/ROXICET) 5-325 MG PER TABLET    Take 1  tablet by mouth 2 (two) times daily as needed. For pain   PROAIR HFA 108 (90 BASE) MCG/ACT INHALER    Inhale 2 puffs into the lungs every 6 (six) hours as needed for wheezing or shortness of breath.    SYMBICORT 160-4.5 MCG/ACT INHALER    INHALE ONE TO TWO PUFFS BY MOUTH TWICE DAILY FOR  COPD   VITAMIN B-12 (CYANOCOBALAMIN) 1000 MCG TABLET    Take 1,000 mcg by mouth daily.    Review of Systems  Constitutional: Negative for fever, chills, appetite change and fatigue.  Respiratory: Positive for shortness of breath. Negative for chest tightness.   Cardiovascular: Positive for leg swelling (occasinally in ankles). Negative for chest pain and palpitations.  Gastrointestinal: Negative for nausea, vomiting and abdominal pain.  Neurological: Negative for dizziness and weakness.    Social History  Substance Use Topics  . Smoking status: Current Every Day Smoker -- 1.00 packs/day for 30 years    Types: Cigarettes  . Smokeless tobacco: Not on file  . Alcohol Use: No   Objective:   BP 146/66 mmHg  Pulse 74  Temp(Src) 98.5 F (36.9 C) (Oral)  Resp 16  Ht '5\' 7"'$  (1.702 m)  Wt 150 lb (68.04 kg)  BMI 23.49 kg/m2  SpO2 100%  Physical Exam  General Appearance:    Alert, cooperative, no distress, obese  Eyes:    PERRL, conjunctiva/corneas clear, EOM's intact       Lungs:     Clear to auscultation bilaterally, respirations unlabored  Heart:    Regular rate and rhythm  Neurologic:   Awake, alert, oriented x 3. No apparent focal neurological           defect.        Results for orders placed or performed in visit on 02/18/15  POCT HgB A1C  Result Value Ref Range   Hemoglobin A1C 5.8    Est. average glucose Bld gHb Est-mCnc 120   POCT UA - Microalbumin  Result Value Ref Range   Microalbumin Ur, POC 20 mg/L   Creatinine, POC n/a mg/dL   Albumin/Creatinine Ratio, Urine, POC n/a        Assessment & Plan:     1. Diabetes mellitus type 2 with neurological manifestations ([place]) Well  controlled.   - POCT HgB A1C - POCT UA - Microalbumin - metFORMIN (GLUCOPHAGE) 1000 MG tablet; Take 1 tablet (1,000 mg total) by mouth daily.  Dispense: 1 tablet; Refill: 1   2. Osteopenia Due for follow up BMD  3. Vitamin B12 deficiency anemia  - CBC - Vitamin B12  4. Vitamin D deficiency  - Vit D  25 hydroxy (rtn osteoporosis monitoring)  5. Current tobacco use Has cut back and working on stopping.   6. Depressive disorder Doing well off of antidepressants.   7. Need for influenza vaccination  - Flu vaccine HIGH DOSE PF  8. Estrogen deficiency  - DG Bone Density; Future  9. Chronic kidney disease (CKD), stage III (moderate) - Renal function panel       Lelon Huh, MD  [facility]  [facility]

## 2015-02-19 ENCOUNTER — Telehealth: Payer: Self-pay | Admitting: Family Medicine

## 2015-02-19 ENCOUNTER — Encounter: Payer: Self-pay | Admitting: Family Medicine

## 2015-02-19 MED ORDER — ENALAPRIL-HYDROCHLOROTHIAZIDE 5-12.5 MG PO TABS: 2.0000 | ORAL_TABLET | Freq: Every day | ORAL | Status: DC

## 2015-02-19 NOTE — Telephone Encounter (Signed)
Pt concern for medication instructions for Enalapril-Hydrochlorothiazide 5-12.5 mg per tablet. Pt stated that her bottle says take two tablets daily and the Medication list from [facility] office visit from yesterday says take one tablet per day.  Pt also stated that she does not recall for any changes on her medication at the ov yesterday. She said that she requested a refill of this medication yesterday, and that she wants to make sure the dose instructions are correct.  Please advise.  Thanks,

## 2015-02-19 NOTE — Telephone Encounter (Signed)
Pt would like a nurse to return her call to make sure she understand the directions for Enalapril-Hydrochlorothiazide 5-12.5 MG per tablet. Thanks TNP

## 2015-02-19 NOTE — Telephone Encounter (Signed)
The correct dose is 2 tablets daily. The computer system had not been corrected yet when the AVS was printed yesterday.   rf has already been sent to her pharmacy.

## 2015-02-19 NOTE — Telephone Encounter (Signed)
Patient notified. Patient expressed understanding.  

## 2015-02-22 ENCOUNTER — Other Ambulatory Visit: Payer: Self-pay | Admitting: Family Medicine

## 2015-02-22 ENCOUNTER — Telehealth: Payer: Self-pay

## 2015-02-22 DIAGNOSIS — D519 Vitamin B12 deficiency anemia, unspecified: Secondary | ICD-10-CM | POA: Diagnosis not present

## 2015-02-22 DIAGNOSIS — E1149 Type 2 diabetes mellitus with other diabetic neurological complication: Secondary | ICD-10-CM | POA: Diagnosis not present

## 2015-02-22 MED ORDER — ENALAPRIL-HYDROCHLOROTHIAZIDE 10-25 MG PO TABS: 1.0000 | ORAL_TABLET | Freq: Every day | ORAL | Status: DC

## 2015-02-22 NOTE — Telephone Encounter (Signed)
Opened by error.

## 2015-02-22 NOTE — Progress Notes (Signed)
LMOVM for pt to return call 

## 2015-02-22 NOTE — Progress Notes (Signed)
Advised pt as directed. Pt verbalized fully understanding.  Thanks,   

## 2015-02-22 NOTE — Progress Notes (Signed)
Please advise patient that insurance would not cover 2 a day of her enalapril 5-12.5, so we changed to dose to 10-25 and she should only take ONE a day when the new rx arrives in the mail   Thanks

## 2015-02-23 LAB — RENAL FUNCTION PANEL
Albumin: 4.3 g/dL (ref 3.5–4.8)
BUN/Creatinine Ratio: 22 (ref 11–26)
BUN: 27 mg/dL (ref 8–27)
CALCIUM: 10.2 mg/dL (ref 8.7–10.3)
CO2: 25 mmol/L (ref 18–29)
CREATININE: 1.21 mg/dL — AB (ref 0.57–1.00)
Chloride: 95 mmol/L — ABNORMAL LOW (ref 97–108)
GFR calc Af Amer: 52 mL/min/{1.73_m2} — ABNORMAL LOW (ref >59)
GFR calc non Af Amer: 45 mL/min/{1.73_m2} — ABNORMAL LOW (ref >59)
Glucose: 108 mg/dL — ABNORMAL HIGH (ref 65–99)
PHOSPHORUS: 5.2 mg/dL — AB (ref 2.5–4.5)
Potassium: 4.5 mmol/L (ref 3.5–5.2)
Sodium: 137 mmol/L (ref 134–144)

## 2015-02-23 LAB — CBC
HEMATOCRIT: 32.2 % — AB (ref 34.0–46.6)
Hemoglobin: 10.5 g/dL — ABNORMAL LOW (ref 11.1–15.9)
MCH: 27.4 pg (ref 26.6–33.0)
MCHC: 32.6 g/dL (ref 31.5–35.7)
MCV: 84 fL (ref 79–97)
Platelets: 269 10*3/uL (ref 150–379)
RBC: 3.83 x10E6/uL (ref 3.77–5.28)
RDW: 15.1 % (ref 12.3–15.4)
WBC: 10.7 10*3/uL (ref 3.4–10.8)

## 2015-02-23 LAB — VITAMIN B12: VITAMIN B 12: 1626 pg/mL — AB (ref 211–946)

## 2015-02-23 LAB — VITAMIN D 25 HYDROXY (VIT D DEFICIENCY, FRACTURES): VIT D 25 HYDROXY: 41 ng/mL (ref 30.0–100.0)

## 2015-02-26 ENCOUNTER — Other Ambulatory Visit: Payer: Self-pay | Admitting: Family Medicine

## 2015-02-26 DIAGNOSIS — I712 Thoracic aortic aneurysm, without rupture, unspecified: Secondary | ICD-10-CM

## 2015-02-26 DIAGNOSIS — I77810 Thoracic aortic ectasia: Secondary | ICD-10-CM | POA: Insufficient documentation

## 2015-02-26 DIAGNOSIS — R911 Solitary pulmonary nodule: Secondary | ICD-10-CM

## 2015-02-26 NOTE — Progress Notes (Unsigned)
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 Thanks.

## 2015-02-27 ENCOUNTER — Telehealth: Payer: Self-pay | Admitting: Family Medicine

## 2015-03-05 ENCOUNTER — Ambulatory Visit
Admission: RE | Admit: 2015-03-05 | Discharge: 2015-03-05 | Disposition: A | Discharge status: Home / Self Care | Payer: Medicare Other | Source: Ambulatory Visit | Attending: Family Medicine | Admitting: Family Medicine

## 2015-03-05 DIAGNOSIS — E2839 Other primary ovarian failure: Secondary | ICD-10-CM | POA: Diagnosis not present

## 2015-03-05 DIAGNOSIS — Z780 Asymptomatic menopausal state: Secondary | ICD-10-CM | POA: Diagnosis not present

## 2015-03-05 DIAGNOSIS — M85851 Other specified disorders of bone density and structure, right thigh: Secondary | ICD-10-CM | POA: Insufficient documentation

## 2015-03-06 ENCOUNTER — Ambulatory Visit
Admission: RE | Admit: 2015-03-06 | Discharge: 2015-03-06 | Disposition: A | Discharge status: Home / Self Care | Payer: Medicare Other | Source: Ambulatory Visit | Attending: Family Medicine | Admitting: Family Medicine

## 2015-03-06 DIAGNOSIS — I712 Thoracic aortic aneurysm, without rupture, unspecified: Secondary | ICD-10-CM

## 2015-03-06 DIAGNOSIS — R911 Solitary pulmonary nodule: Secondary | ICD-10-CM | POA: Insufficient documentation

## 2015-03-06 DIAGNOSIS — I2510 Atherosclerotic heart disease of native coronary artery without angina pectoris: Secondary | ICD-10-CM | POA: Insufficient documentation

## 2015-03-06 MED ORDER — IOHEXOL 300 MG/ML  SOLN
75.0000 mL | Freq: Once | INTRAMUSCULAR | Status: AC | PRN
  Administered 2015-03-06: 75 mL via INTRAVENOUS

## 2015-03-06 NOTE — Telephone Encounter (Signed)
Pt stated that she was returning a call to Dr. Maralyn Sago nurse and she thinks it is about the results from the test she had done yesterday 03/05/15. Pt would like a call back. Thanks TNP

## 2015-03-07 NOTE — Telephone Encounter (Signed)
Patient was notified of results.  

## 2015-03-08 ENCOUNTER — Other Ambulatory Visit: Payer: Self-pay | Admitting: Family Medicine

## 2015-03-08 MED ORDER — ENALAPRIL-HYDROCHLOROTHIAZIDE 10-25 MG PO TABS: 1.0000 | ORAL_TABLET | Freq: Every day | ORAL | Status: DC

## 2015-03-14 ENCOUNTER — Other Ambulatory Visit: Payer: Self-pay | Admitting: *Deleted

## 2015-03-14 MED ORDER — AMITRIPTYLINE HCL 25 MG PO TABS: 25.0000 mg | ORAL_TABLET | Freq: Every day | ORAL | Status: DC

## 2015-03-14 MED ORDER — ATORVASTATIN CALCIUM 10 MG PO TABS: ORAL_TABLET | ORAL | Status: DC

## 2015-03-14 NOTE — Telephone Encounter (Signed)
Patient is switching pharmacies to [facility].

## 2015-03-15 ENCOUNTER — Encounter: Payer: Self-pay | Admitting: Family Medicine

## 2015-03-15 ENCOUNTER — Ambulatory Visit (INDEPENDENT_AMBULATORY_CARE_PROVIDER_SITE_OTHER): Payer: Medicare Other | Admitting: Family Medicine

## 2015-03-15 VITALS — BP 122/70 | HR 66 | Temp 98.2°F | Resp 16 | Ht 65.0 in | Wt 146.6 lb

## 2015-03-15 DIAGNOSIS — J40 Bronchitis, not specified as acute or chronic: Secondary | ICD-10-CM | POA: Diagnosis not present

## 2015-03-15 MED ORDER — AZITHROMYCIN 250 MG PO TABS: ORAL_TABLET | ORAL | Status: AC

## 2015-03-15 MED ORDER — MONTELUKAST SODIUM 10 MG PO TABS: 10.0000 mg | ORAL_TABLET | Freq: Every day | ORAL | Status: DC

## 2015-03-15 NOTE — Progress Notes (Signed)
Patient: Erica Rivas Female    DOB: 12/30/1940   74 y.o.   MRN: [id number] Visit Date: 03/15/2015  Today's Provider: Lelon Huh, MD   Chief Complaint  Patient presents with  . Cough   Subjective:    HPI Patient comes in office today with concerns of cold like symptoms since Tuesday 10/25, patient reports that she has had a productive cough with clear mucous. Associated symptoms include: runny/watery eyes, sneezing, ear pain and congestion. Patient reports taking otc Tylenol Cold and Mucinex.     No Known Allergies Previous Medications   AMITRIPTYLINE (ELAVIL) 25 MG TABLET    Take 1 tablet (25 mg total) by mouth at bedtime.   ASPIRIN EC 81 MG TABLET    Take 81 mg by mouth daily.   ASPIRIN-ACETAMINOPHEN-CAFFEINE (EXCEDRIN MIGRAINE) 250-250-65 MG TABLET    Take 1 tablet by mouth daily as needed.   ATENOLOL (TENORMIN) 50 MG TABLET    Take 1 tablet (50 mg total) by mouth 2 (two) times daily.   ATORVASTATIN (LIPITOR) 10 MG TABLET    TAKE ONE TABLET BY MOUTH AT BEDTIME FOR CHOLESTEROL   CHOLECALCIFEROL (VITAMIN D) 2000 UNITS TABLET    Take 1,000 Units by mouth daily.    ENALAPRIL-HYDROCHLOROTHIAZIDE (VASERETIC) 10-25 MG TABLET    Take 1 tablet by mouth daily.   FUROSEMIDE (LASIX) 20 MG TABLET    Take 1 tablet by mouth daily as needed. For swelling   GLIPIZIDE (GLUCOTROL XL) 10 MG 24 HR TABLET    Take 1 tablet (10 mg total) by mouth daily.   LORAZEPAM (ATIVAN) 0.5 MG TABLET    Take 0.5 tablets by mouth at bedtime as needed.   METFORMIN (GLUCOPHAGE) 1000 MG TABLET    Take 1 tablet (1,000 mg total) by mouth daily.   OMEPRAZOLE (PRILOSEC) 20 MG CAPSULE    Take 20 mg by mouth 2 (two) times daily.   OXYCODONE-ACETAMINOPHEN (PERCOCET/ROXICET) 5-325 MG TABLET    Take 1 tablet by mouth 2 (two) times daily as needed. For pain   PROAIR HFA 108 (90 BASE) MCG/ACT INHALER    Inhale 2 puffs into the lungs every 6 (six) hours as needed for wheezing or shortness of breath.    SYMBICORT 160-4.5  MCG/ACT INHALER    INHALE ONE TO TWO PUFFS BY MOUTH TWICE DAILY FOR  COPD   VITAMIN B-12 (CYANOCOBALAMIN) 1000 MCG TABLET    Take 1,000 mcg by mouth daily.    Review of Systems  Constitutional: Positive for appetite change. Negative for fever, chills, diaphoresis, activity change, fatigue and unexpected weight change.  HENT: Positive for ear pain, postnasal drip, rhinorrhea, sinus pressure and sneezing. Negative for congestion, dental problem, ear discharge, facial swelling, hearing loss, mouth sores, nosebleeds, sore throat, trouble swallowing and voice change.   Eyes: Positive for discharge. Negative for photophobia, pain, redness, itching and visual disturbance.  Respiratory: Positive for cough, shortness of breath and wheezing. Negative for apnea and chest tightness.   Gastrointestinal: Negative.   Endocrine: Negative.   Genitourinary: Negative.   Musculoskeletal: Negative.   Skin: Negative.   Allergic/Immunologic: Negative.   Neurological: Positive for headaches. Negative for dizziness, tremors, seizures, syncope, facial asymmetry, speech difficulty, weakness, light-headedness and numbness.  Psychiatric/Behavioral: Negative.    Patient Active Problem List   Diagnosis Date Noted  . Thoracic aortic aneurysm ([place]) 02/26/2015  . Allergic rhinitis 02/18/2015  . Anxiety 02/18/2015  . Aortic root dilation ([place]) 02/18/2015  . Chronic kidney disease (  CKD), stage III (moderate) 02/18/2015  . Edema 02/18/2015  . Insomnia 02/18/2015  . Lung nodule 02/18/2015  . Mass of pancreas 02/18/2015  . Osteopenia 02/18/2015  . Elevated WBC count 02/18/2015  . Weight loss 02/18/2015  . Diastolic dysfunction 24/01/7352  . Vitamin B12 deficiency anemia 11/04/2014  . Vitamin D deficiency 10/27/2013  . Internal hemorrhoids without complication 29/92/4268  . Chronic airway obstruction ([place]) 07/08/2007  . Current tobacco use 07/08/2007  . 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 Disc disorder of lumbar region 07/08/2007  . Esophageal reflux 07/08/2007  . Family history of breast cancer 07/08/2007  . History of adenomatous polyp of colon 07/08/2007  . History of other malignant neoplasm of skin 07/08/2007  . Benign essential HTN 07/08/2007  . Pure hypercholesterolemia 07/08/2007     Social History  Substance Use Topics  . Smoking status: Current Every Day Smoker -- 1.00 packs/day for 30 years    Types: Cigarettes  . Smokeless tobacco: Not on file  . Alcohol Use: No   Objective:   BP 122/70 mmHg  Pulse 66  Temp(Src) 98.2 F (36.8 C) (Oral)  Resp 16  Ht '5\' 5"'$  (1.651 m)  Wt 146 lb 9.6 oz (66.497 kg)  BMI 24.40 kg/m2  SpO2 97%  Physical Exam  General Appearance:    Alert, cooperative, no distress  HENT:   neck without nodes, pharynx erythematous without exudate, sinuses nontender and nasal mucosa pale and congested  Eyes:    PERRL, conjunctiva/corneas clear, EOM's intact       Lungs:     Diffuse intermittent expiratory wheezes. respirations unlabored  Heart:    Regular rate and rhythm  Neurologic:   Awake, alert, oriented x 3. No apparent focal neurological           defect.           Assessment & Plan:     1. Bronchitis  - montelukast (SINGULAIR) 10 MG tablet; Take 1 tablet (10 mg total) by mouth daily.  Dispense: 30 tablet; Refill: 0 - azithromycin (ZITHROMAX) 250 MG tablet; 2 by mouth today, then 1 daily for 4 days  Dispense: 6 tablet; Refill: 0        Lelon Huh, MD  [facility]

## 2015-03-19 ENCOUNTER — Telehealth: Payer: Self-pay | Admitting: Family Medicine

## 2015-03-19 NOTE — Telephone Encounter (Signed)
Pt states she was in last week and has completely taken all medication that was given.  Pt is still coughing and still has congestion.  [place].  CB#[phone]/MW

## 2015-03-20 MED ORDER — LEVOFLOXACIN 500 MG PO TABS: 500.0000 mg | ORAL_TABLET | Freq: Every day | ORAL | Status: DC

## 2015-03-20 NOTE — Telephone Encounter (Signed)
Rx sent to pharmacy. Pt notified.

## 2015-03-20 NOTE — Telephone Encounter (Signed)
Ok. Change antibiotic to Levaquin '500mg'$  one tablet daily for 7 days.

## 2015-04-15 ENCOUNTER — Other Ambulatory Visit: Payer: Self-pay | Admitting: *Deleted

## 2015-04-15 MED ORDER — OMEPRAZOLE 20 MG PO CPDR: 20.0000 mg | DELAYED_RELEASE_CAPSULE | Freq: Two times a day (BID) | ORAL | Status: DC

## 2015-06-21 ENCOUNTER — Ambulatory Visit (INDEPENDENT_AMBULATORY_CARE_PROVIDER_SITE_OTHER): Payer: Medicare Other | Admitting: Family Medicine

## 2015-06-21 ENCOUNTER — Encounter: Payer: Self-pay | Admitting: Family Medicine

## 2015-06-21 VITALS — BP 122/62 | HR 65 | Temp 97.8°F | Resp 16 | Ht 67.0 in | Wt 149.0 lb

## 2015-06-21 DIAGNOSIS — J309 Allergic rhinitis, unspecified: Secondary | ICD-10-CM | POA: Diagnosis not present

## 2015-06-21 DIAGNOSIS — M519 Unspecified thoracic, thoracolumbar and lumbosacral intervertebral disc disorder: Secondary | ICD-10-CM

## 2015-06-21 DIAGNOSIS — E118 Type 2 diabetes mellitus with unspecified complications: Secondary | ICD-10-CM | POA: Diagnosis not present

## 2015-06-21 DIAGNOSIS — J449 Chronic obstructive pulmonary disease, unspecified: Secondary | ICD-10-CM

## 2015-06-21 LAB — POCT GLYCOSYLATED HEMOGLOBIN (HGB A1C)
Est. average glucose Bld gHb Est-mCnc: 137
HEMOGLOBIN A1C: 6.4

## 2015-06-21 MED ORDER — MONTELUKAST SODIUM 10 MG PO TABS: 10.0000 mg | ORAL_TABLET | Freq: Every day | ORAL | Status: DC

## 2015-06-21 MED ORDER — OXYCODONE-ACETAMINOPHEN 5-325 MG PO TABS: 1.0000 | ORAL_TABLET | Freq: Two times a day (BID) | ORAL | Status: DC | PRN

## 2015-06-21 NOTE — Progress Notes (Signed)
Patient: Erica Rivas Female    DOB: 02-Jan-1941   75 y.o.   MRN: [id number] Visit Date: 06/21/2015  Today's Provider: Lelon Huh, MD   Chief Complaint  Patient presents with  . Diabetes    follow up  . Hypertension    follow up  . Chronic Kidney Disease    follow up   Subjective:    HPI   Diabetes Mellitus Type II, Follow-up:   Lab Results  Component Value Date   HGBA1C 5.8 02/18/2015   HGBA1C 6.5* 06/28/2014    Last seen for diabetes 4 months ago.  Management since then includes no changes. She reports good compliance with treatment. She is not having side effects.  Current symptoms include paresthesia of the feet and have been stable. Home blood sugar records: fasting range: 120's  Episodes of hypoglycemia? yes - rare   Current Insulin Regimen: none Most Recent Eye Exam: 1 year Weight trend: stable Prior visit with dietician: no Current diet: in general, a "healthy" diet   Current exercise: none  Pertinent Labs:    Component Value Date/Time   CREATININE 1.21* 02/22/2015 0835   CREATININE 1.4* 08/31/2014    Wt Readings from Last 3 Encounters:  03/15/15 146 lb 9.6 oz (66.497 kg)  02/18/15 150 lb (68.04 kg)  04/28/14 183 lb (83.008 kg)    ------------------------------------------------------------------------   Hypertension, follow-up:  BP Readings from Last 3 Encounters:  03/15/15 122/70  02/18/15 146/66  04/28/14 155/79    She was last seen for hypertension 4 months ago.  BP at that visit was 146/66. Management since that visit includes changing dose of Enalapril/ HCTZ to 10-'25mg'$  daily due to insurance not covering the prior prescribed dose of 5-12.'5mg'$  two pills daily. She reports good compliance with treatment. She is not having side effects.  She is not exercising. She is adherent to low salt diet.   Outside blood pressures are not being checked. She is experiencing swelling in ankles and feet. Patient denies chest pain, chest  pressure/discomfort, claudication, dyspnea, exertional chest pressure/discomfort, fatigue, irregular heart beat, near-syncope and orthopnea.   Cardiovascular risk factors include advanced age (older than 30 for men, 85 for women), diabetes mellitus and hypertension.  Use of agents associated with hypertension: NSAIDS.     Weight trend: stable Wt Readings from Last 3 Encounters:  03/15/15 146 lb 9.6 oz (66.497 kg)  02/18/15 150 lb (68.04 kg)  04/28/14 183 lb (83.008 kg)    Current diet: in general, a "healthy" diet    ------------------------------------------------------------------------  Follow up CKD:  Last office visit was 4 months ago and no changes were made.    Follow up Vitamin D Deficiency:  Last office visit was 4 months ago and no changes were made. Patient reports good compliance with treatment.    Follow up Vitamin D-12 Deficiency Anemia:  Last office visit was 4 months ago and no changes were made.  Patient reports good compliance with treatment.     No Known Allergies Previous Medications   AMITRIPTYLINE (ELAVIL) 25 MG TABLET    Take 1 tablet (25 mg total) by mouth at bedtime.   ASPIRIN EC 81 MG TABLET    Take 81 mg by mouth daily.   ATENOLOL (TENORMIN) 50 MG TABLET    Take 1 tablet (50 mg total) by mouth 2 (two) times daily.   ATORVASTATIN (LIPITOR) 10 MG TABLET    TAKE ONE TABLET BY MOUTH AT BEDTIME FOR CHOLESTEROL   CHOLECALCIFEROL (  VITAMIN D) 2000 UNITS TABLET    Take 1,000 Units by mouth daily.    ENALAPRIL-HYDROCHLOROTHIAZIDE (VASERETIC) 10-25 MG TABLET    Take 1 tablet by mouth daily.   FUROSEMIDE (LASIX) 20 MG TABLET    Take 1 tablet by mouth daily as needed. For swelling   GLIPIZIDE (GLUCOTROL XL) 10 MG 24 HR TABLET    Take 1 tablet (10 mg total) by mouth daily.   LORAZEPAM (ATIVAN) 0.5 MG TABLET    Take 0.5 tablets by mouth at bedtime as needed.   METFORMIN (GLUCOPHAGE) 1000 MG TABLET    Take 1 tablet (1,000 mg total) by mouth daily.   MONTELUKAST  (SINGULAIR) 10 MG TABLET    Take 1 tablet (10 mg total) by mouth daily.   OMEPRAZOLE (PRILOSEC) 20 MG CAPSULE    Take 1 capsule (20 mg total) by mouth 2 (two) times daily.   OXYCODONE-ACETAMINOPHEN (PERCOCET/ROXICET) 5-325 MG TABLET    Take 1 tablet by mouth 2 (two) times daily as needed. For pain   PROAIR HFA 108 (90 BASE) MCG/ACT INHALER    Inhale 2 puffs into the lungs every 6 (six) hours as needed for wheezing or shortness of breath.    SYMBICORT 160-4.5 MCG/ACT INHALER    INHALE ONE TO TWO PUFFS BY MOUTH TWICE DAILY FOR  COPD   VITAMIN B-12 (CYANOCOBALAMIN) 1000 MCG TABLET    Take 1,000 mcg by mouth daily.    Review of Systems  Constitutional: Negative for fever, chills, diaphoresis, appetite change and fatigue.  Respiratory: Positive for shortness of breath. Negative for chest tightness.   Cardiovascular: Positive for leg swelling. Negative for chest pain and palpitations.  Gastrointestinal: Negative for nausea, vomiting and abdominal pain.  Endocrine: Negative for polydipsia, polyphagia and polyuria.  Musculoskeletal: Positive for arthralgias (unchanged from baseline).  Neurological: Positive for light-headedness (occasionally) and numbness (in feet). Negative for dizziness, weakness and headaches.    Social History  Substance Use Topics  . Smoking status: Current Every Day Smoker -- 0.75 packs/day for 30 years    Types: Cigarettes  . Smokeless tobacco: Not on file  . Alcohol Use: No   Objective:   BP 122/62 mmHg  Pulse 65  Temp(Src) 97.8 F (36.6 C) (Oral)  Resp 16  Ht '5\' 7"'$  (1.702 m)  Wt 149 lb (67.586 kg)  BMI 23.33 kg/m2  SpO2 97%  Physical Exam  General Appearance:    Alert, cooperative, no distress  HENT:   ENT exam normal, no neck nodes or sinus tenderness  Eyes:    PERRL, conjunctiva/corneas clear, EOM's intact       Lungs:     Mild expiratory wheeze, respirations unlabored  Heart:    Regular rate and rhythm  Neurologic:   Awake, alert, oriented x 3. No  apparent focal neurological           defect.         Results for orders placed or performed in visit on 06/21/15  POCT HgB A1C  Result Value Ref Range   Hemoglobin A1C 6.4    Est. average glucose Bld gHb Est-mCnc 137         Assessment & Plan:     1. Controlled type 2 diabetes mellitus with complication, without long-term current use of insulin ([place]) Well controlled.  Continue current medications.   - POCT HgB A1C  2. Chronic obstructive pulmonary disease, unspecified COPD type ([place]) Continue current inhalers  3. Disc disorder of lumbar region Stable on current  pain medications.  - oxyCODONE-acetaminophen (PERCOCET/ROXICET) 5-325 MG tablet; Take 1 tablet by mouth 2 (two) times daily as needed. For pain  Dispense: 60 tablet; Refill: 0  4. Allergic rhinitis, unspecified allergic rhinitis type Singulair working well.  - montelukast (SINGULAIR) 10 MG tablet; Take 1 tablet (10 mg total) by mouth daily.  Dispense: 30 tablet; Refill: [place], MD  [facility]

## 2015-10-28 ENCOUNTER — Telehealth: Payer: Self-pay

## 2015-10-28 NOTE — Telephone Encounter (Signed)
Patient has an appointment scheduled tomorrow to be seen for coughing, fever, weakness, and no energy. I called patient to Triage the Call and patient states since Friday she has been feeling, weak and fatigued. She has had shortness of breath but she states she has COPD and it is no different than what she normally has. Patient has also had fever up to 100.7 along with cough. Cough is sometimes productive with white phlegm. I advised patient that she should come in today to be seen to make sure she doesn't have or has not developed pneumonia. I offered her an appointment today, but she declined the appointment. Patient states she would rather wait until tomorrow, when her husband is able to come in with her to the appointment. Appointment scheduled 10/29/2015 at 10am.

## 2015-10-29 ENCOUNTER — Encounter: Payer: Self-pay | Admitting: Family Medicine

## 2015-10-29 ENCOUNTER — Ambulatory Visit (INDEPENDENT_AMBULATORY_CARE_PROVIDER_SITE_OTHER): Payer: Medicare Other | Admitting: Family Medicine

## 2015-10-29 VITALS — BP 92/54 | HR 76 | Temp 97.4°F | Resp 16 | Ht 67.0 in | Wt 152.0 lb

## 2015-10-29 DIAGNOSIS — J069 Acute upper respiratory infection, unspecified: Secondary | ICD-10-CM | POA: Diagnosis not present

## 2015-10-29 DIAGNOSIS — R059 Cough, unspecified: Secondary | ICD-10-CM

## 2015-10-29 DIAGNOSIS — R05 Cough: Secondary | ICD-10-CM

## 2015-10-29 MED ORDER — HYDROCODONE-HOMATROPINE 5-1.5 MG/5ML PO SYRP: 5.0000 mL | ORAL_SOLUTION | Freq: Three times a day (TID) | ORAL | Status: DC | PRN

## 2015-10-29 MED ORDER — CEFDINIR 300 MG PO CAPS: 600.0000 mg | ORAL_CAPSULE | Freq: Every day | ORAL | Status: AC

## 2015-10-29 NOTE — Progress Notes (Signed)
Patient: Erica Rivas Female    DOB: 04-29-1941   75 y.o.   MRN: [id number] Visit Date: 10/29/2015  Today's Provider: Lelon Huh, MD   Chief Complaint  Patient presents with  . Cough  . Fever   Subjective:    Fever  This is a new problem. The current episode started in the past 7 days (3 days ago). The problem occurs intermittently. The problem has been gradually improving. The maximum temperature noted was 102 to 102.9 F. The temperature was taken using an oral thermometer. Associated symptoms include congestion, coughing, muscle aches, nausea, a sore throat and wheezing. Pertinent negatives include no abdominal pain, chest pain, diarrhea, ear pain, headaches, rash, sleepiness, urinary pain or vomiting. She has tried acetaminophen (tylenol and otc cough syrup) for the symptoms. The treatment provided mild relief.    Patient stated that Friday 10/25/2015 she began feeling weak and tired. On Saturday 10/26/2015 she began to have low-grade fever. Fever continued until yesterday, and got as high as 102.0. Patient also has yeast on her tongue. Patient does slight sore throat and some chest congestion. Has taken otc tylenol and cough syrup with mild relief. Cough is improved today. No more short of breath than usual.    No Known Allergies Current Meds  Medication Sig  . amitriptyline (ELAVIL) 25 MG tablet Take 1 tablet (25 mg total) by mouth at bedtime.  Marland Kitchen aspirin EC 81 MG tablet Take 81 mg by mouth daily.  Marland Kitchen atenolol (TENORMIN) 50 MG tablet Take 1 tablet (50 mg total) by mouth 2 (two) times daily.  Marland Kitchen atorvastatin (LIPITOR) 10 MG tablet TAKE ONE TABLET BY MOUTH AT BEDTIME FOR CHOLESTEROL  . Cholecalciferol (VITAMIN D) 2000 UNITS tablet Take 1,000 Units by mouth daily.   . enalapril-hydrochlorothiazide (VASERETIC) 10-25 MG tablet Take 1 tablet by mouth daily.  . furosemide (LASIX) 20 MG tablet Take 1 tablet by mouth daily as needed. For swelling  . glipiZIDE (GLUCOTROL XL) 10 MG 24 hr  tablet Take 1 tablet (10 mg total) by mouth daily.  Marland Kitchen LORazepam (ATIVAN) 0.5 MG tablet Take 0.5 tablets by mouth at bedtime as needed.  . metFORMIN (GLUCOPHAGE) 1000 MG tablet Take 1 tablet (1,000 mg total) by mouth daily.  Marland Kitchen omeprazole (PRILOSEC) 20 MG capsule Take 1 capsule (20 mg total) by mouth 2 (two) times daily.  Marland Kitchen oxyCODONE-acetaminophen (PERCOCET/ROXICET) 5-325 MG tablet Take 1 tablet by mouth 2 (two) times daily as needed. For pain  . PROAIR HFA 108 (90 BASE) MCG/ACT inhaler Inhale 2 puffs into the lungs every 6 (six) hours as needed for wheezing or shortness of breath.   . SYMBICORT 160-4.5 MCG/ACT inhaler INHALE ONE TO TWO PUFFS BY MOUTH TWICE DAILY FOR  COPD  . vitamin B-12 (CYANOCOBALAMIN) 1000 MCG tablet Take 1,000 mcg by mouth daily.    Review of Systems  Constitutional: Positive for fever. Negative for appetite change and fatigue.  HENT: Positive for congestion and sore throat. Negative for ear pain.   Respiratory: Positive for cough and wheezing. Negative for chest tightness.   Cardiovascular: Negative for chest pain and palpitations.  Gastrointestinal: Positive for nausea. Negative for vomiting, abdominal pain and diarrhea.  Genitourinary: Negative for dysuria.  Skin: Negative for rash.  Neurological: Positive for weakness. Negative for dizziness and headaches.    Social History  Substance Use Topics  . Smoking status: Current Every Day Smoker -- 0.75 packs/day for 30 years    Types: Cigarettes  . Smokeless  tobacco: Not on file  . Alcohol Use: No   Objective:   BP 92/54 mmHg  Pulse 76  Temp(Src) 97.4 F (36.3 C) (Oral)  Resp 16  Ht '5\' 7"'$  (1.702 m)  Wt 152 lb (68.947 kg)  BMI 23.80 kg/m2  SpO2 96%  Physical Exam  General Appearance:    Alert, cooperative, no distress  HENT:   ENT exam normal, no neck nodes or sinus tenderness  Eyes:    PERRL, conjunctiva/corneas clear, EOM's intact       Lungs:     Clear to auscultation bilaterally, respirations unlabored   Heart:    Regular rate and rhythm  Neurologic:   Awake, alert, oriented x 3. No apparent focal neurological           defect.           Assessment & Plan:     1. Upper respiratory infection Much improved today from yesterday. Counseled regarding signs and symptoms of viral and bacterial respiratory infections. Advised to call or fill antibiotic prescription if she develops any sign of bacterial infection, or if current symptoms last longer than 10 days.   - cefdinir (OMNICEF) 300 MG capsule; Take 2 capsules (600 mg total) by mouth daily.  Dispense: 20 capsule; Refill: 0  2. Cough  - HYDROcodone-homatropine (HYCODAN) 5-1.5 MG/5ML syrup; Take 5 mLs by mouth every 8 (eight) hours as needed for cough.  Dispense: 120 mL; Refill: 0     The entirety of the information documented in the History of Present Illness, Review of Systems and Physical Exam were personally obtained by me. Portions of this information were initially documented by April M. Sabra Heck, CMA and reviewed by me for thoroughness and accuracy.    Lelon Huh, MD  [facility]

## 2015-12-04 ENCOUNTER — Other Ambulatory Visit: Payer: Self-pay | Admitting: Family Medicine

## 2015-12-04 DIAGNOSIS — Z1231 Encounter for screening mammogram for malignant neoplasm of breast: Secondary | ICD-10-CM

## 2015-12-11 ENCOUNTER — Other Ambulatory Visit: Payer: Self-pay | Admitting: Family Medicine

## 2015-12-20 ENCOUNTER — Ambulatory Visit (INDEPENDENT_AMBULATORY_CARE_PROVIDER_SITE_OTHER): Payer: Medicare Other | Admitting: Family Medicine

## 2015-12-20 ENCOUNTER — Encounter: Payer: Self-pay | Admitting: Family Medicine

## 2015-12-20 VITALS — BP 110/70 | HR 67 | Temp 98.0°F | Resp 16 | Ht 67.0 in | Wt 159.0 lb

## 2015-12-20 DIAGNOSIS — D519 Vitamin B12 deficiency anemia, unspecified: Secondary | ICD-10-CM

## 2015-12-20 DIAGNOSIS — E7800 Pure hypercholesterolemia, unspecified: Secondary | ICD-10-CM

## 2015-12-20 DIAGNOSIS — E1140 Type 2 diabetes mellitus with diabetic neuropathy, unspecified: Secondary | ICD-10-CM

## 2015-12-20 DIAGNOSIS — N183 Chronic kidney disease, stage 3 unspecified: Secondary | ICD-10-CM

## 2015-12-20 DIAGNOSIS — J449 Chronic obstructive pulmonary disease, unspecified: Secondary | ICD-10-CM

## 2015-12-20 DIAGNOSIS — M519 Unspecified thoracic, thoracolumbar and lumbosacral intervertebral disc disorder: Secondary | ICD-10-CM

## 2015-12-20 DIAGNOSIS — I10 Essential (primary) hypertension: Secondary | ICD-10-CM | POA: Diagnosis not present

## 2015-12-20 DIAGNOSIS — E559 Vitamin D deficiency, unspecified: Secondary | ICD-10-CM

## 2015-12-20 LAB — POCT GLYCOSYLATED HEMOGLOBIN (HGB A1C)
ESTIMATED AVERAGE GLUCOSE: 146
Hemoglobin A1C: 6.7

## 2015-12-20 MED ORDER — OXYCODONE-ACETAMINOPHEN 5-325 MG PO TABS: 1.0000 | ORAL_TABLET | Freq: Two times a day (BID) | ORAL | 0 refills | Status: DC | PRN

## 2015-12-20 MED ORDER — ONETOUCH DELICA LANCETS 33G MISC: 3 refills | Status: DC

## 2015-12-20 NOTE — Progress Notes (Signed)
Patient: Erica Rivas Female    DOB: [birth year]   75 y.o.   MRN: [id number] Visit Date: 12/20/2015  Today's Provider: Lelon Huh, MD   Chief Complaint  Patient presents with  . Follow-up  . Diabetes  . COPD  . Allergic Rhinitis    Subjective:    HPI  Chronic obstructive pulmonary disease, unspecified COPD type: From 06/21/2015- no changes were made. Continues Sybmicort every day which has helped her breathing quite a bit. Working on smoking cessation  Disc disorder of lumbar region: Continues on hydrocodone/apap which she feels is controlling pain adequately and having no adverse effects.      Diabetes Mellitus Type II, Follow-up:   Lab Results  Component Value Date   HGBA1C 6.4 06/21/2015   HGBA1C 5.8 02/18/2015   HGBA1C 6.5 (A) 06/28/2014   Last seen for diabetes 6 months ago.  Management since then includes; no changes. She reports good compliance with treatment. She is not having side effects. none Current symptoms include none and have been unchanged. Home blood sugar records: fasting range: 120-170  Episodes of hypoglycemia? no   Current Insulin Regimen: n/a Most Recent Eye Exam: due in September 2017 Weight trend: stable Prior visit with dietician: no Current diet: in general, a "healthy" diet   Current exercise: none  ----------------------------------------------------------------      No Known Allergies Current Meds  Medication Sig  . amitriptyline (ELAVIL) 25 MG tablet Take 1 tablet (25 mg total) by mouth at bedtime.  Marland Kitchen aspirin EC 81 MG tablet Take 81 mg by mouth daily.  Marland Kitchen atenolol (TENORMIN) 50 MG tablet Take 1 tablet (50 mg total) by mouth 2 (two) times daily.  Marland Kitchen atorvastatin (LIPITOR) 10 MG tablet TAKE ONE TABLET BY MOUTH AT BEDTIME FOR CHOLESTEROL  . Cholecalciferol (VITAMIN D) 2000 UNITS tablet Take 1,000 Units by mouth daily.   . enalapril-hydrochlorothiazide (VASERETIC) 10-25 MG tablet Take 1 tablet by mouth daily.  .  furosemide (LASIX) 20 MG tablet Take 1 tablet by mouth daily as needed. For swelling  . glipiZIDE (GLUCOTROL XL) 10 MG 24 hr tablet Take 1 tablet (10 mg total) by mouth daily.  Marland Kitchen HYDROcodone-homatropine (HYCODAN) 5-1.5 MG/5ML syrup Take 5 mLs by mouth every 8 (eight) hours as needed for cough.  Marland Kitchen LORazepam (ATIVAN) 0.5 MG tablet Take 0.5 tablets by mouth at bedtime as needed.  . metFORMIN (GLUCOPHAGE) 1000 MG tablet Take 1 tablet (1,000 mg total) by mouth daily.  Marland Kitchen omeprazole (PRILOSEC) 20 MG capsule Take 1 capsule (20 mg total) by mouth 2 (two) times daily.  Marland Kitchen oxyCODONE-acetaminophen (PERCOCET/ROXICET) 5-325 MG tablet Take 1 tablet by mouth 2 (two) times daily as needed. For pain  . PROAIR HFA 108 (90 BASE) MCG/ACT inhaler Inhale 2 puffs into the lungs every 6 (six) hours as needed for wheezing or shortness of breath.   . SYMBICORT 160-4.5 MCG/ACT inhaler INHALE ONE TO TWO PUFFS BY MOUTH TWICE DAILY FOR  COPD  . vitamin B-12 (CYANOCOBALAMIN) 1000 MCG tablet Take 1,000 mcg by mouth daily.    Review of Systems  Constitutional: Negative for appetite change, chills, fatigue and fever.  Respiratory: Negative for chest tightness and shortness of breath.   Cardiovascular: Negative for chest pain and palpitations.  Gastrointestinal: Negative for abdominal pain, nausea and vomiting.  Neurological: Negative for dizziness and weakness.    Social History  Substance Use Topics  . Smoking status: Current Every Day Smoker    Packs/day: 0.75  Years: 30.00    Types: Cigarettes  . Smokeless tobacco: Not on file  . Alcohol use No   Objective:   BP 110/70 (BP Location: Left Arm, Patient Position: Sitting, Cuff Size: Normal)   Pulse 67   Temp 98 F (36.7 C) (Oral)   Resp 16   Ht '5\' 7"'$  (1.702 m)   Wt 159 lb (72.1 kg)   SpO2 99%   BMI 24.90 kg/m   Physical Exam   General Appearance:    Alert, cooperative, no distress  Eyes:    PERRL, conjunctiva/corneas clear, EOM's intact       Lungs:      Occasional expiratory wheeze, respirations unlabored  Heart:    Regular rate and rhythm  Neurologic:   Awake, alert, oriented x 3. No apparent focal neurological           defect.       Results for orders placed or performed in visit on 12/20/15  POCT glycosylated hemoglobin (Hb A1C)  Result Value Ref Range   Hemoglobin A1C 6.7    Est. average glucose Bld gHb Est-mCnc 146         Assessment & Plan:      Fall Risk  12/20/2015  Falls in the past year? No   Depression screen PHQ 2/9 12/20/2015  Decreased Interest 0  Down, Depressed, Hopeless 0  PHQ - 2 Score 0  Altered sleeping 0  Tired, decreased energy 0  Change in appetite 0  Feeling bad or failure about yourself  0  Trouble concentrating 1  Moving slowly or fidgety/restless 0  Suicidal thoughts 0  PHQ-9 Score 1  Difficult doing work/chores Not difficult at all     1. Type 2 diabetes mellitus with diabetic neuropathy, without long-term current use of insulin (HCC) Well controlled.  Continue current medications.   - POCT glycosylated hemoglobin (Hb A1C)  2. Benign essential HTN Well controlled.  Continue current medications.   - Renal function panel  3. Chronic obstructive pulmonary disease, unspecified COPD type ([place]) Continue Symbicort and discussed medications to help with smoking cessation. She is going to continue to try quitting on her own.   4. Chronic kidney disease (CKD), stage III (moderate) - Renal function panel  5. Pure hypercholesterolemia She is tolerating atorvastatin well with no adverse effects.   - Lipid panel  6. Vitamin B12 deficiency anemia  - Vitamin B12 - CBC  7. Vitamin D deficiency  - VITAMIN D 25 Hydroxy (Vit-D Deficiency, Fractures)  8. Disc disorder of lumbar region Pain adequately controlled Continue current medications.   - oxyCODONE-acetaminophen (PERCOCET/ROXICET) 5-325 MG tablet; Take 1 tablet by mouth 2 (two) times daily as needed. For pain  Dispense: 60 tablet; Refill:  0   Lelon Huh, MD  [facility]

## 2015-12-20 NOTE — Patient Instructions (Signed)
Consider trial of Zyban to help stop smoking

## 2015-12-23 ENCOUNTER — Other Ambulatory Visit: Payer: Self-pay | Admitting: Family Medicine

## 2015-12-23 ENCOUNTER — Ambulatory Visit
Admission: RE | Admit: 2015-12-23 | Discharge: 2015-12-23 | Disposition: A | Discharge status: Home / Self Care | Payer: Medicare Other | Source: Ambulatory Visit | Attending: Family Medicine | Admitting: Family Medicine

## 2015-12-23 DIAGNOSIS — Z1231 Encounter for screening mammogram for malignant neoplasm of breast: Secondary | ICD-10-CM | POA: Insufficient documentation

## 2015-12-23 LAB — HM MAMMOGRAPHY: HM MAMMO: NORMAL (ref 0–4)

## 2015-12-24 DIAGNOSIS — E559 Vitamin D deficiency, unspecified: Secondary | ICD-10-CM | POA: Diagnosis not present

## 2015-12-24 DIAGNOSIS — I10 Essential (primary) hypertension: Secondary | ICD-10-CM | POA: Diagnosis not present

## 2015-12-24 DIAGNOSIS — D519 Vitamin B12 deficiency anemia, unspecified: Secondary | ICD-10-CM | POA: Diagnosis not present

## 2015-12-24 DIAGNOSIS — E7800 Pure hypercholesterolemia, unspecified: Secondary | ICD-10-CM | POA: Diagnosis not present

## 2015-12-25 LAB — RENAL FUNCTION PANEL
ALBUMIN: 4.1 g/dL (ref 3.5–4.8)
BUN/Creatinine Ratio: 10 — ABNORMAL LOW (ref 12–28)
BUN: 21 mg/dL (ref 8–27)
CALCIUM: 9.9 mg/dL (ref 8.7–10.3)
CHLORIDE: 96 mmol/L (ref 96–106)
CO2: 26 mmol/L (ref 18–29)
Creatinine, Ser: 2.19 mg/dL — ABNORMAL HIGH (ref 0.57–1.00)
GFR calc non Af Amer: 22 mL/min/{1.73_m2} — ABNORMAL LOW (ref >59)
GFR, EST AFRICAN AMERICAN: 25 mL/min/{1.73_m2} — AB (ref >59)
GLUCOSE: 126 mg/dL — AB (ref 65–99)
POTASSIUM: 4.7 mmol/L (ref 3.5–5.2)
Phosphorus: 4.9 mg/dL — ABNORMAL HIGH (ref 2.5–4.5)
Sodium: 139 mmol/L (ref 134–144)

## 2015-12-25 LAB — CBC
Hematocrit: 33.6 % — ABNORMAL LOW (ref 34.0–46.6)
Hemoglobin: 10.9 g/dL — ABNORMAL LOW (ref 11.1–15.9)
MCH: 27.6 pg (ref 26.6–33.0)
MCHC: 32.4 g/dL (ref 31.5–35.7)
MCV: 85 fL (ref 79–97)
PLATELETS: 324 10*3/uL (ref 150–379)
RBC: 3.95 x10E6/uL (ref 3.77–5.28)
RDW: 14.6 % (ref 12.3–15.4)
WBC: 12.3 10*3/uL — AB (ref 3.4–10.8)

## 2015-12-25 LAB — LIPID PANEL
CHOLESTEROL TOTAL: 177 mg/dL (ref 100–199)
Chol/HDL Ratio: 4.4 ratio units (ref 0.0–4.4)
HDL: 40 mg/dL (ref >39)
LDL Calculated: 104 mg/dL — ABNORMAL HIGH (ref 0–99)
Triglycerides: 166 mg/dL — ABNORMAL HIGH (ref 0–149)
VLDL CHOLESTEROL CAL: 33 mg/dL (ref 5–40)

## 2015-12-25 LAB — VITAMIN B12: Vitamin B-12: >2000 pg/mL — ABNORMAL HIGH (ref 211–946)

## 2015-12-25 LAB — VITAMIN D 25 HYDROXY (VIT D DEFICIENCY, FRACTURES): VIT D 25 HYDROXY: 36 ng/mL (ref 30.0–100.0)

## 2016-01-08 ENCOUNTER — Ambulatory Visit (INDEPENDENT_AMBULATORY_CARE_PROVIDER_SITE_OTHER): Payer: Medicare Other | Admitting: Family Medicine

## 2016-01-08 ENCOUNTER — Encounter: Payer: Self-pay | Admitting: Family Medicine

## 2016-01-08 VITALS — BP 150/70 | HR 69 | Temp 97.6°F | Resp 16 | Ht 67.0 in | Wt 158.0 lb

## 2016-01-08 DIAGNOSIS — I10 Essential (primary) hypertension: Secondary | ICD-10-CM | POA: Diagnosis not present

## 2016-01-08 DIAGNOSIS — N289 Disorder of kidney and ureter, unspecified: Secondary | ICD-10-CM | POA: Diagnosis not present

## 2016-01-08 NOTE — Progress Notes (Signed)
Patient: Erica Rivas Female    DOB: 1941/05/18   75 y.o.   MRN: [id number] Visit Date: 01/08/2016  Today's Provider: Lelon Huh, MD   Chief Complaint  Patient presents with  . Follow-up  . Hypertension   Subjective:    HPI     Hypertension, follow-up:  BP Readings from Last 3 Encounters:  01/08/16 (!) 150/70  12/20/15 110/70  10/29/15 (!) 92/54    She was last seen for hypertension 2 weeks ago.  BP at that visit was 110/70. Labs showed her creatinine had increased to 2.19 so she was advised to stop enalapril-hctz and increase water consumption. She also has rx for furosemide which she only takes occasionally for swelling.  Home SBP have been running in the 140s-170s since stopping medication.  ----------------------------------------------------------------    No Known Allergies Current Meds  Medication Sig  . amitriptyline (ELAVIL) 25 MG tablet Take 1 tablet (25 mg total) by mouth at bedtime.  Marland Kitchen aspirin EC 81 MG tablet Take 81 mg by mouth daily.  Marland Kitchen atenolol (TENORMIN) 50 MG tablet Take 1 tablet (50 mg total) by mouth 2 (two) times daily.  Marland Kitchen atorvastatin (LIPITOR) 10 MG tablet TAKE ONE TABLET BY MOUTH AT BEDTIME FOR CHOLESTEROL  . Cholecalciferol (VITAMIN D) 2000 UNITS tablet Take 1,000 Units by mouth daily.   . furosemide (LASIX) 20 MG tablet Take 1 tablet by mouth daily as needed. For swelling  . glipiZIDE (GLUCOTROL XL) 10 MG 24 hr tablet Take 1 tablet (10 mg total) by mouth daily.  Marland Kitchen HYDROcodone-homatropine (HYCODAN) 5-1.5 MG/5ML syrup Take 5 mLs by mouth every 8 (eight) hours as needed for cough.  Marland Kitchen LORazepam (ATIVAN) 0.5 MG tablet Take 0.5 tablets by mouth at bedtime as needed.  . metFORMIN (GLUCOPHAGE) 1000 MG tablet Take 1 tablet (1,000 mg total) by mouth daily.  Marland Kitchen omeprazole (PRILOSEC) 20 MG capsule Take 1 capsule (20 mg total) by mouth 2 (two) times daily.  Glory Rosebush DELICA LANCETS 32G MISC Use to check blood sugar daily  .  oxyCODONE-acetaminophen (PERCOCET/ROXICET) 5-325 MG tablet Take 1 tablet by mouth 2 (two) times daily as needed. For pain  . PROAIR HFA 108 (90 BASE) MCG/ACT inhaler Inhale 2 puffs into the lungs every 6 (six) hours as needed for wheezing or shortness of breath.   . SYMBICORT 160-4.5 MCG/ACT inhaler INHALE ONE TO TWO PUFFS BY MOUTH TWICE DAILY FOR  COPD  . vitamin B-12 (CYANOCOBALAMIN) 1000 MCG tablet Take 1,000 mcg by mouth daily.    Review of Systems  Constitutional: Negative for appetite change, chills, fatigue and fever.  Respiratory: Negative for chest tightness and shortness of breath.   Cardiovascular: Negative for chest pain and palpitations.  Gastrointestinal: Negative for abdominal pain, nausea and vomiting.  Neurological: Negative for dizziness and weakness.    Social History  Substance Use Topics  . Smoking status: Current Every Day Smoker    Packs/day: 0.75    Years: 30.00    Types: Cigarettes  . Smokeless tobacco: Not on file  . Alcohol use No   Objective:   BP (!) 150/70 (BP Location: Right Arm, Patient Position: Sitting, Cuff Size: Normal)   Pulse 69   Temp 97.6 F (36.4 C) (Oral)   Resp 16   Ht '5\' 7"'$  (1.702 m)   Wt 158 lb (71.7 kg)   SpO2 98%   BMI 24.75 kg/m   Physical Exam  General appearance: alert, well developed, well nourished, cooperative and in  no distress Head: Normocephalic, without obvious abnormality, atraumatic Respiratory: Respirations even and unlabored, normal respiratory rate Extremities: No gross deformities Skin: Skin color, texture, turgor normal. No rashes seen  Psych: Appropriate mood and affect. Neurologic: Mental status: Alert, oriented to person, place, and time, thought content appropriate.     Assessment & Plan:     .1. Renal insufficiency Consider referral to nephrology if not back to baseline - Renal function panel  2. Benign essential HTN Uncontrolled since stopping enalapril-hctz. See how renal functions are before  starting new medication.        Lelon Huh, MD  [facility]

## 2016-01-09 ENCOUNTER — Other Ambulatory Visit: Payer: Self-pay

## 2016-01-09 LAB — RENAL FUNCTION PANEL
ALBUMIN: 4.5 g/dL (ref 3.5–4.8)
BUN/Creatinine Ratio: 10 — ABNORMAL LOW (ref 12–28)
BUN: 13 mg/dL (ref 8–27)
CHLORIDE: 99 mmol/L (ref 96–106)
CO2: 24 mmol/L (ref 18–29)
Calcium: 10.1 mg/dL (ref 8.7–10.3)
Creatinine, Ser: 1.26 mg/dL — ABNORMAL HIGH (ref 0.57–1.00)
GFR calc non Af Amer: 42 mL/min/{1.73_m2} — ABNORMAL LOW (ref >59)
GFR, EST AFRICAN AMERICAN: 49 mL/min/{1.73_m2} — AB (ref >59)
GLUCOSE: 88 mg/dL (ref 65–99)
PHOSPHORUS: 3.2 mg/dL (ref 2.5–4.5)
POTASSIUM: 4.1 mmol/L (ref 3.5–5.2)
SODIUM: 139 mmol/L (ref 134–144)

## 2016-01-09 MED ORDER — LISINOPRIL 10 MG PO TABS: 10.0000 mg | ORAL_TABLET | Freq: Every day | ORAL | 0 refills | Status: DC

## 2016-02-10 DIAGNOSIS — E119 Type 2 diabetes mellitus without complications: Secondary | ICD-10-CM | POA: Diagnosis not present

## 2016-02-11 ENCOUNTER — Other Ambulatory Visit: Payer: Self-pay | Admitting: Family Medicine

## 2016-02-11 DIAGNOSIS — J309 Allergic rhinitis, unspecified: Secondary | ICD-10-CM

## 2016-02-16 ENCOUNTER — Other Ambulatory Visit: Payer: Self-pay | Admitting: Family Medicine

## 2016-02-21 ENCOUNTER — Encounter (INDEPENDENT_AMBULATORY_CARE_PROVIDER_SITE_OTHER): Payer: Self-pay | Admitting: Ophthalmology

## 2016-02-21 DIAGNOSIS — H43813 Vitreous degeneration, bilateral: Secondary | ICD-10-CM | POA: Diagnosis not present

## 2016-02-21 DIAGNOSIS — I10 Essential (primary) hypertension: Secondary | ICD-10-CM

## 2016-02-21 DIAGNOSIS — H35341 Macular cyst, hole, or pseudohole, right eye: Secondary | ICD-10-CM | POA: Diagnosis not present

## 2016-02-21 DIAGNOSIS — H35033 Hypertensive retinopathy, bilateral: Secondary | ICD-10-CM

## 2016-02-21 DIAGNOSIS — H35373 Puckering of macula, bilateral: Secondary | ICD-10-CM | POA: Diagnosis not present

## 2016-02-23 ENCOUNTER — Other Ambulatory Visit: Payer: Self-pay | Admitting: Family Medicine

## 2016-02-25 DIAGNOSIS — D0461 Carcinoma in situ of skin of right upper limb, including shoulder: Secondary | ICD-10-CM | POA: Diagnosis not present

## 2016-02-25 DIAGNOSIS — C44629 Squamous cell carcinoma of skin of left upper limb, including shoulder: Secondary | ICD-10-CM | POA: Diagnosis not present

## 2016-02-25 DIAGNOSIS — C44729 Squamous cell carcinoma of skin of left lower limb, including hip: Secondary | ICD-10-CM | POA: Diagnosis not present

## 2016-02-25 DIAGNOSIS — Z85828 Personal history of other malignant neoplasm of skin: Secondary | ICD-10-CM | POA: Diagnosis not present

## 2016-02-25 DIAGNOSIS — L570 Actinic keratosis: Secondary | ICD-10-CM | POA: Diagnosis not present

## 2016-02-25 DIAGNOSIS — D0462 Carcinoma in situ of skin of left upper limb, including shoulder: Secondary | ICD-10-CM | POA: Diagnosis not present

## 2016-02-25 DIAGNOSIS — D485 Neoplasm of uncertain behavior of skin: Secondary | ICD-10-CM | POA: Diagnosis not present

## 2016-03-02 ENCOUNTER — Ambulatory Visit (INDEPENDENT_AMBULATORY_CARE_PROVIDER_SITE_OTHER): Payer: Medicare Other | Admitting: Family Medicine

## 2016-03-02 ENCOUNTER — Encounter: Payer: Self-pay | Admitting: Family Medicine

## 2016-03-02 VITALS — BP 140/80 | HR 67 | Temp 98.0°F | Resp 16 | Wt 159.0 lb

## 2016-03-02 DIAGNOSIS — N289 Disorder of kidney and ureter, unspecified: Secondary | ICD-10-CM

## 2016-03-02 DIAGNOSIS — Z23 Encounter for immunization: Secondary | ICD-10-CM

## 2016-03-02 DIAGNOSIS — I10 Essential (primary) hypertension: Secondary | ICD-10-CM | POA: Diagnosis not present

## 2016-03-02 NOTE — Progress Notes (Signed)
Patient: Erica Rivas Female    DOB: 01-Jun-1940   75 y.o.   MRN: [id number] Visit Date: 03/02/2016  Today's Provider: Lelon Huh, MD   Chief Complaint  Patient presents with  . Follow-up  . Hypertension   Subjective:    HPI    Hypertension, follow-up:  BP Readings from Last 3 Encounters:  03/02/16 140/80  01/08/16 (!) 150/70  12/20/15 110/70    She was last seen for hypertension 1 months ago.  BP at that visit was 150/70. Management since that visit includes; started lisinopril 10 mg qd.She reports good compliance with treatment. She is not having side effects. none She is exercising. She is adherent to low salt diet.   Outside blood pressures are normal. She is experiencing none.  Patient denies none.   Cardiovascular risk factors include diabetes mellitus.  Use of agents associated with hypertension: none.   ----------------------------------------------------------------    No Known Allergies   Current Outpatient Prescriptions:  .  amitriptyline (ELAVIL) 25 MG tablet, TAKE 1 TABLET BY MOUTH AT  BEDTIME, Disp: 90 tablet, Rfl: 4 .  aspirin EC 81 MG tablet, Take 81 mg by mouth daily., Disp: , Rfl:  .  atenolol (TENORMIN) 50 MG tablet, TAKE 1 TABLET BY MOUTH TWO  TIMES DAILY, Disp: 180 tablet, Rfl: 4 .  atorvastatin (LIPITOR) 10 MG tablet, TAKE 1 TABLET BY MOUTH AT  BEDTIME FOR CHOLESTEROL, Disp: 90 tablet, Rfl: 4 .  Cholecalciferol (VITAMIN D) 2000 UNITS tablet, Take 1,000 Units by mouth daily. , Disp: , Rfl:  .  glipiZIDE (GLUCOTROL XL) 10 MG 24 hr tablet, TAKE 1 TABLET BY MOUTH  DAILY, Disp: 90 tablet, Rfl: 4 .  HYDROcodone-homatropine (HYCODAN) 5-1.5 MG/5ML syrup, Take 5 mLs by mouth every 8 (eight) hours as needed for cough., Disp: 120 mL, Rfl: 0 .  lisinopril (PRINIVIL,ZESTRIL) 10 MG tablet, TAKE 1 TABLET BY MOUTH  DAILY, Disp: 90 tablet, Rfl: 4 .  metFORMIN (GLUCOPHAGE) 1000 MG tablet, TAKE 1 TABLET BY MOUTH TWO  TIMES DAILY, Disp: 180 tablet,  Rfl: 5 .  montelukast (SINGULAIR) 10 MG tablet, TAKE ONE TABLET BY MOUTH ONCE DAILY, Disp: 30 tablet, Rfl: 12 .  omeprazole (PRILOSEC) 20 MG capsule, Take 1 capsule (20 mg total) by mouth 2 (two) times daily., Disp: 180 capsule, Rfl: 4 .  ONETOUCH DELICA LANCETS 75Z MISC, Use to check blood sugar daily, Disp: 100 each, Rfl: 3 .  oxyCODONE-acetaminophen (PERCOCET/ROXICET) 5-325 MG tablet, Take 1 tablet by mouth 2 (two) times daily as needed. For pain, Disp: 60 tablet, Rfl: 0 .  PROAIR HFA 108 (90 BASE) MCG/ACT inhaler, Inhale 2 puffs into the lungs every 6 (six) hours as needed for wheezing or shortness of breath. , Disp: , Rfl:  .  SYMBICORT 160-4.5 MCG/ACT inhaler, INHALE ONE TO TWO PUFFS BY MOUTH TWICE DAILY FOR  COPD, Disp: 1 Inhaler, Rfl: 11 .  vitamin B-12 (CYANOCOBALAMIN) 1000 MCG tablet, Take 1,000 mcg by mouth daily., Disp: , Rfl:   Review of Systems  Constitutional: Negative for appetite change, chills, fatigue and fever.  Respiratory: Negative for chest tightness and shortness of breath.   Cardiovascular: Negative for chest pain and palpitations.  Gastrointestinal: Negative for abdominal pain, nausea and vomiting.  Neurological: Negative for dizziness and weakness.    Social History  Substance Use Topics  . Smoking status: Current Every Day Smoker    Packs/day: 0.75    Years: 30.00    Types: Cigarettes  .  Smokeless tobacco: Not on file  . Alcohol use No   Objective:   BP 140/80 (BP Location: Right Arm, Patient Position: Sitting, Cuff Size: Normal)   Pulse 67   Temp 98 F (36.7 C) (Oral)   Resp 16   Wt 159 lb (72.1 kg)   SpO2 99%   BMI 24.90 kg/m   Physical Exam   General Appearance:    Alert, cooperative, no distress  Eyes:    PERRL, conjunctiva/corneas clear, EOM's intact       Lungs:     Clear to auscultation bilaterally, respirations unlabored  Heart:    Regular rate and rhythm  Neurologic:   Awake, alert, oriented x 3. No apparent focal neurological            defect.           Assessment & Plan:     1. Renal insufficiency  - Renal function panel  2. Benign essential HTN  - Renal function panel  3. Need for influenza vaccination  - Flu vaccine HIGH DOSE PF   Return in about 3 months (around 06/02/2016) for diabetes.       Lelon Huh, MD  [facility]

## 2016-03-02 NOTE — Patient Instructions (Signed)
Please stop smoking 

## 2016-03-03 LAB — RENAL FUNCTION PANEL
ALBUMIN: 4.3 g/dL (ref 3.5–4.8)
BUN/Creatinine Ratio: 9 — ABNORMAL LOW (ref 12–28)
BUN: 11 mg/dL (ref 8–27)
CO2: 27 mmol/L (ref 18–29)
CREATININE: 1.21 mg/dL — AB (ref 0.57–1.00)
Calcium: 9.8 mg/dL (ref 8.7–10.3)
Chloride: 97 mmol/L (ref 96–106)
GFR, EST AFRICAN AMERICAN: 51 mL/min/{1.73_m2} — AB (ref >59)
GFR, EST NON AFRICAN AMERICAN: 44 mL/min/{1.73_m2} — AB (ref >59)
Glucose: 77 mg/dL (ref 65–99)
Phosphorus: 3.7 mg/dL (ref 2.5–4.5)
Potassium: 4.6 mmol/L (ref 3.5–5.2)
Sodium: 140 mmol/L (ref 134–144)

## 2016-03-27 ENCOUNTER — Telehealth: Payer: Self-pay | Admitting: Family Medicine

## 2016-03-27 DIAGNOSIS — R059 Cough, unspecified: Secondary | ICD-10-CM

## 2016-03-27 DIAGNOSIS — R05 Cough: Secondary | ICD-10-CM

## 2016-03-27 MED ORDER — ALBUTEROL SULFATE HFA 108 (90 BASE) MCG/ACT IN AERS: 2.0000 | INHALATION_SPRAY | Freq: Four times a day (QID) | RESPIRATORY_TRACT | 2 refills | Status: DC | PRN

## 2016-03-27 MED ORDER — HYDROCODONE-HOMATROPINE 5-1.5 MG/5ML PO SYRP: 5.0000 mL | ORAL_SOLUTION | Freq: Three times a day (TID) | ORAL | 0 refills | Status: DC | PRN

## 2016-03-27 NOTE — Telephone Encounter (Signed)
Pt contacted office for refill request on the following medications: 1. PROAIR HFA 108 (90 BASE) MCG/ACT inhaler  Last written: 09/24/14 Would like it sent to [facility] (HYCODAN) 5-1.5 MG/5ML syrup Last written: 10/29/15 Pt was advised this Rx can't be sent to pharmacy. Please advise. Thanks TNP

## 2016-03-27 NOTE — Telephone Encounter (Signed)
Please review-aa 

## 2016-03-31 DIAGNOSIS — C44629 Squamous cell carcinoma of skin of left upper limb, including shoulder: Secondary | ICD-10-CM | POA: Diagnosis not present

## 2016-03-31 DIAGNOSIS — L908 Other atrophic disorders of skin: Secondary | ICD-10-CM | POA: Diagnosis not present

## 2016-03-31 DIAGNOSIS — L814 Other melanin hyperpigmentation: Secondary | ICD-10-CM | POA: Diagnosis not present

## 2016-03-31 DIAGNOSIS — L578 Other skin changes due to chronic exposure to nonionizing radiation: Secondary | ICD-10-CM | POA: Diagnosis not present

## 2016-04-10 ENCOUNTER — Telehealth: Payer: Self-pay | Admitting: Family Medicine

## 2016-04-10 MED ORDER — FUROSEMIDE 20 MG PO TABS: 20.0000 mg | ORAL_TABLET | Freq: Every day | ORAL | 1 refills | Status: DC | PRN

## 2016-04-10 NOTE — Telephone Encounter (Signed)
Pt called saying her feet are swelling and she needs to start back on the lasix again.  Please advise.  Pt call back is [phone]  Thanks, Con Memos

## 2016-04-10 NOTE — Telephone Encounter (Signed)
have sent rx to [address]. She should only take as needed, not every day. She needs o.v. If she has any shortness of breath or dizziness.

## 2016-04-10 NOTE — Telephone Encounter (Signed)
Patient was advised. KW 

## 2016-04-10 NOTE — Telephone Encounter (Signed)
Please review patient was discontinued off medication in 2016, wasn't sure if office visit was needed to address. KW

## 2016-04-16 ENCOUNTER — Ambulatory Visit (INDEPENDENT_AMBULATORY_CARE_PROVIDER_SITE_OTHER): Payer: Medicare Other | Admitting: Family Medicine

## 2016-04-16 VITALS — BP 180/88 | HR 64 | Temp 97.8°F | Ht 66.0 in | Wt 162.4 lb

## 2016-04-16 DIAGNOSIS — Z0000 Encounter for general adult medical examination without abnormal findings: Secondary | ICD-10-CM | POA: Diagnosis not present

## 2016-04-16 NOTE — Progress Notes (Signed)
Subjective:   Erica Rivas is a 75 y.o. female who presents for Medicare Annual (Subsequent) preventive examination.  Review of Systems:  N/A  Cardiac Risk Factors include: advanced age (>31mn, >>16women);diabetes mellitus;dyslipidemia;hypertension     Objective:     Vitals: BP (!) 180/88 (BP Location: Right Arm)   Pulse 64   Temp 97.8 F (36.6 C) (Oral)   Ht '5\' 6"'$  (1.676 m)   Wt 162 lb 6 oz (73.7 kg)   BMI 26.21 kg/m   Body mass index is 26.21 kg/m.   Tobacco History  Smoking Status  . Current Every Day Smoker  . Packs/day: 0.75  . Years: 30.00  . Types: Cigarettes  Smokeless Tobacco  . Never Used     Ready to quit: Not Answered Counseling given: Not Answered   Past Medical History:  Diagnosis Date  . Anemia   . Cancer (HCC)    skin cancer - SCC  . Chronic kidney disease   . Colon polyps   . COPD (chronic obstructive pulmonary disease) ([place]   . Depression   . 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 Diabetes mellitus without complication ([place]   . GERD (gastroesophageal reflux disease)   . Hemorrhoids   . History of chicken pox   . History of measles   . History of mumps   . Hyperlipidemia   . Hypertension   . Lung nodule 02/18/2015   No additional follow up needed per CT report 03/05/2015   . Osteopenia    Past Surgical History:  Procedure Laterality Date  . ABDOMINAL HYSTERECTOMY  1985   DUB; ovaries resected/ removed  . BREAST BIOPSY Right 11/12/2014   fibroadenomatous  . CARDIOVASCULAR STRESS TEST  02/2008   low risk  . DOPPLER ECHOCARDIOGRAPHY  01/2010   EF>55%, LV relaxation impairment consistent with diastolic dysfunction, mild TR and MR  . ESOPHAGOGASTRODUODENOSCOPY  07/2009   +diffuse gastritis. Iftikhar  . GALLBLADDER SURGERY  1994  . HAND SURGERY Right 2010   surgery x 2. Dr. MSabra Heck . NECK SURGERY  1998   Disc surgey on neck  . Sinus Surgery    . TUBAL LIGATION  1971   Family History  Problem Relation Age of Onset  .  Hypertension Sister   . COPD Sister   . Thyroid disease Sister   . Breast cancer Sister 559 . Thyroid disease Sister   . Heart attack Mother   . Hypertension Mother   . Thyroid disease Mother     HYPERTHYROIDISM  . GI Bleed Father   . Lung cancer Brother   . Breast cancer Sister   . Hypertension Sister   . Aneurysm Sister     in the back of her neck  . Heart attack Brother     CABG   History  Sexual Activity  . Sexual activity: Not on file    Outpatient Encounter Prescriptions as of 04/16/2016  Medication Sig  . albuterol (PROAIR HFA) 108 (90 Base) MCG/ACT inhaler Inhale 2 puffs into the lungs every 6 (six) hours as needed for wheezing or shortness of breath.  .Marland Kitchenamitriptyline (ELAVIL) 25 MG tablet TAKE 1 TABLET BY MOUTH AT  BEDTIME  . aspirin EC 81 MG tablet Take 81 mg by mouth daily.  .Marland Kitchenatenolol (TENORMIN) 50 MG tablet TAKE 1 TABLET BY MOUTH TWO  TIMES DAILY  . atorvastatin (LIPITOR) 10 MG tablet TAKE 1 TABLET BY MOUTH AT  BEDTIME FOR CHOLESTEROL  . Cholecalciferol (VITAMIN D) 2000 UNITS  tablet Take 1,000 Units by mouth daily.   . furosemide (LASIX) 20 MG tablet Take 1 tablet (20 mg total) by mouth daily as needed (swelling). For swelling  . glipiZIDE (GLUCOTROL XL) 10 MG 24 hr tablet TAKE 1 TABLET BY MOUTH  DAILY  . HYDROcodone-homatropine (HYCODAN) 5-1.5 MG/5ML syrup Take 5 mLs by mouth every 8 (eight) hours as needed for cough.  Marland Kitchen lisinopril (PRINIVIL,ZESTRIL) 10 MG tablet TAKE 1 TABLET BY MOUTH  DAILY  . metFORMIN (GLUCOPHAGE) 1000 MG tablet TAKE 1 TABLET BY MOUTH TWO  TIMES DAILY  . montelukast (SINGULAIR) 10 MG tablet TAKE ONE TABLET BY MOUTH ONCE DAILY  . omeprazole (PRILOSEC) 20 MG capsule Take 1 capsule (20 mg total) by mouth 2 (two) times daily.  Glory Rosebush DELICA LANCETS 33A MISC Use to check blood sugar daily  . oxyCODONE-acetaminophen (PERCOCET/ROXICET) 5-325 MG tablet Take 1 tablet by mouth 2 (two) times daily as needed. For pain  . SYMBICORT 160-4.5 MCG/ACT  inhaler INHALE ONE TO TWO PUFFS BY MOUTH TWICE DAILY FOR  COPD  . vitamin B-12 (CYANOCOBALAMIN) 1000 MCG tablet Take 1,000 mcg by mouth daily.   No facility-administered encounter medications on file as of 04/16/2016.     Activities of Daily Living In your present state of health, do you have any difficulty performing the following activities: 04/16/2016  Hearing? N  Vision? Y  Difficulty concentrating or making decisions? N  Walking or climbing stairs? N  Dressing or bathing? N  Doing errands, shopping? N  Preparing Food and eating ? N  Using the Toilet? N  In the past six months, have you accidently leaked urine? Y  Do you have problems with loss of bowel control? Y  Managing your Medications? N  Managing your Finances? N  Housekeeping or managing your Housekeeping? N  Some recent data might be hidden    Patient Care Team: Birdie Sons, MD as PCP - General (Family Medicine) Kennieth Francois, MD as Consulting Physician (Dermatology) Agapito Games as Consulting Physician (Optometry) Hayden Pedro, MD as Consulting Physician (Ophthalmology)    Assessment:     Exercise Activities and Dietary recommendations Current Exercise Habits: The patient does not participate in regular exercise at present, Exercise limited by: respiratory conditions(s) (COPD)  Goals    . Increase water intake          Starting 04/16/16, I will continue to drink 6 glasses of water a day.      Fall Risk Fall Risk  04/16/2016 12/20/2015  Falls in the past year? No No   Depression Screen PHQ 2/9 Scores 04/16/2016 12/20/2015  PHQ - 2 Score 0 0  PHQ- 9 Score - 1     Cognitive Function     6CIT Screen 04/16/2016  What Year? 0 points  What month? 0 points  What time? 0 points  Count back from 20 0 points  Months in reverse 0 points  Repeat phrase 4 points  Total Score 4    Immunization History  Administered Date(s) Administered  . Influenza, High Dose Seasonal PF 02/18/2015,  03/02/2016  . Pneumococcal Conjugate-13 10/24/2013  . Pneumococcal Polysaccharide-23 05/18/2005, 04/26/2012  . Tdap 01/01/2012  . Zoster 01/01/2012   Screening Tests Health Maintenance  Topic Date Due  . HEMOGLOBIN A1C  06/21/2016  . FOOT EXAM  12/19/2016  . OPHTHALMOLOGY EXAM  02/15/2017  . MAMMOGRAM  12/22/2017  . DEXA SCAN  03/04/2018  . COLONOSCOPY  07/19/2019  . TETANUS/TDAP  12/31/2021  .  INFLUENZA VACCINE  Completed  . ZOSTAVAX  Completed  . PNA vac Low Risk Adult  Completed      Plan:  I have personally reviewed and addressed the Medicare Annual Wellness questionnaire and have noted the following in the patient's chart:  A. Medical and social history B. Use of alcohol, tobacco or illicit drugs  C. Current medications and supplements D. Functional ability and status E.  Nutritional status F.  Physical activity G. Advance directives H. List of other physicians I.  Hospitalizations, surgeries, and ER visits in previous 12 months J.  [place] such as hearing and vision if needed, cognitive and depression L. Referrals and appointments - none  In addition, I have reviewed and discussed with patient certain preventive protocols, quality metrics, and best practice recommendations. A written personalized care plan for preventive services as well as general preventive health recommendations were provided to patient.  See attached scanned questionnaire for additional information.   Signed,  Fabio Neighbors, LPN Nurse Health Advisor  MD Recommendations: none.  I have reviewed the health advisor's note, was available for consultation, and agree with documentation and plan  Lelon Huh, MD

## 2016-04-16 NOTE — Patient Instructions (Signed)
Ms. Sachdeva , Thank you for taking time to come for your Medicare Wellness Visit. I appreciate your ongoing commitment to your health goals. Please review the following plan we discussed and let me know if I can assist you in the future.   These are the goals we discussed: Goals    . Increase water intake          Starting 04/16/16, I will continue to drink 6 glasses of water a day.       This is a list of the screening recommended for you and due dates:  Health Maintenance  Topic Date Due  . Hemoglobin A1C  06/21/2016  . Complete foot exam   12/19/2016  . Eye exam for diabetics  02/15/2017  . Mammogram  12/22/2017  . DEXA scan (bone density measurement)  03/04/2018  . Colon Cancer Screening  07/19/2019  . Tetanus Vaccine  12/31/2021  . Flu Shot  Completed  . Shingles Vaccine  Completed  . Pneumonia vaccines  Completed   Preventive Care for Adults  A healthy lifestyle and preventive care can promote health and wellness. Preventive health guidelines for adults include the following key practices.  . A routine yearly physical is a good way to check with your health care provider about your health and preventive screening. It is a chance to share any concerns and updates on your health and to receive a thorough exam.  . Visit your dentist for a routine exam and preventive care every 6 months. Brush your teeth twice a day and floss once a day. Good oral hygiene prevents tooth decay and gum disease.  . The frequency of eye exams is based on your age, health, family medical history, use  of contact lenses, and other factors. Follow your health care provider's ecommendations for frequency of eye exams.  . Eat a healthy diet. Foods like vegetables, fruits, whole grains, low-fat dairy products, and lean protein foods contain the nutrients you need without too many calories. Decrease your intake of foods high in solid fats, added sugars, and salt. Eat the right amount of calories for you.  Get information about a proper diet from your health care provider, if necessary.  . Regular physical exercise is one of the most important things you can do for your health. Most adults should get at least 150 minutes of moderate-intensity exercise (any activity that increases your heart rate and causes you to sweat) each week. In addition, most adults need muscle-strengthening exercises on 2 or more days a week.  Silver Sneakers may be a benefit available to you. To determine eligibility, you may visit the website: www.silversneakers.com or contact program at [phone] Mon-Fri between 8AM-8PM.   . Maintain a healthy weight. The body mass index (BMI) is a screening tool to identify possible weight problems. It provides an estimate of body fat based on height and weight. Your health care provider can find your BMI and can help you achieve or maintain a healthy weight.   For adults 20 years and older: ? A BMI below 18.5 is considered underweight. ? A BMI of 18.5 to 24.9 is normal. ? A BMI of 25 to 29.9 is considered overweight. ? A BMI of 30 and above is considered obese.   . Maintain normal blood lipids and cholesterol levels by exercising and minimizing your intake of saturated fat. Eat a balanced diet with plenty of fruit and vegetables. Blood tests for lipids and cholesterol should begin at age 31 and be repeated  every 5 years. If your lipid or cholesterol levels are high, you are over 50, or you are at high risk for heart disease, you may need your cholesterol levels checked more frequently. Ongoing high lipid and cholesterol levels should be treated with medicines if diet and exercise are not working.  . If you smoke, find out from your health care provider how to quit. If you do not use tobacco, please do not start.  . If you choose to drink alcohol, please do not consume more than 2 drinks per day. One drink is considered to be 12 ounces (355 mL) of beer, 5 ounces (148 mL) of wine, or  1.5 ounces (44 mL) of liquor.  . If you are 38-26 years old, ask your health care provider if you should take aspirin to prevent strokes.  . Use sunscreen. Apply sunscreen liberally and repeatedly throughout the day. You should seek shade when your shadow is shorter than you. Protect yourself by wearing long sleeves, pants, a wide-brimmed hat, and sunglasses year round, whenever you are outdoors.  . Once a month, do a whole body skin exam, using a mirror to look at the skin on your back. Tell your health care provider of new moles, moles that have irregular borders, moles that are larger than a pencil eraser, or moles that have changed in shape or color.

## 2016-04-24 DIAGNOSIS — L905 Scar conditions and fibrosis of skin: Secondary | ICD-10-CM | POA: Diagnosis not present

## 2016-04-24 DIAGNOSIS — C44729 Squamous cell carcinoma of skin of left lower limb, including hip: Secondary | ICD-10-CM | POA: Diagnosis not present

## 2016-05-08 DIAGNOSIS — L905 Scar conditions and fibrosis of skin: Secondary | ICD-10-CM | POA: Diagnosis not present

## 2016-05-08 DIAGNOSIS — C44622 Squamous cell carcinoma of skin of right upper limb, including shoulder: Secondary | ICD-10-CM | POA: Diagnosis not present

## 2016-05-22 DIAGNOSIS — D0462 Carcinoma in situ of skin of left upper limb, including shoulder: Secondary | ICD-10-CM | POA: Diagnosis not present

## 2016-05-26 ENCOUNTER — Encounter (INDEPENDENT_AMBULATORY_CARE_PROVIDER_SITE_OTHER): Payer: Medicare Other | Admitting: Ophthalmology

## 2016-06-22 ENCOUNTER — Ambulatory Visit: Payer: Medicare Other | Admitting: Family Medicine

## 2016-06-30 ENCOUNTER — Encounter (INDEPENDENT_AMBULATORY_CARE_PROVIDER_SITE_OTHER): Payer: Medicare Other | Admitting: Ophthalmology

## 2016-06-30 DIAGNOSIS — H35033 Hypertensive retinopathy, bilateral: Secondary | ICD-10-CM | POA: Diagnosis not present

## 2016-06-30 DIAGNOSIS — I10 Essential (primary) hypertension: Secondary | ICD-10-CM

## 2016-06-30 DIAGNOSIS — H35372 Puckering of macula, left eye: Secondary | ICD-10-CM | POA: Diagnosis not present

## 2016-06-30 DIAGNOSIS — H43813 Vitreous degeneration, bilateral: Secondary | ICD-10-CM

## 2016-07-13 ENCOUNTER — Encounter: Payer: Self-pay | Admitting: Family Medicine

## 2016-07-13 ENCOUNTER — Ambulatory Visit (INDEPENDENT_AMBULATORY_CARE_PROVIDER_SITE_OTHER): Payer: Medicare Other | Admitting: Family Medicine

## 2016-07-13 VITALS — BP 172/90 | HR 63 | Temp 97.8°F | Resp 18 | Wt 162.0 lb

## 2016-07-13 DIAGNOSIS — Z720 Tobacco use: Secondary | ICD-10-CM

## 2016-07-13 DIAGNOSIS — E785 Hyperlipidemia, unspecified: Secondary | ICD-10-CM

## 2016-07-13 DIAGNOSIS — M519 Unspecified thoracic, thoracolumbar and lumbosacral intervertebral disc disorder: Secondary | ICD-10-CM

## 2016-07-13 DIAGNOSIS — N289 Disorder of kidney and ureter, unspecified: Secondary | ICD-10-CM | POA: Diagnosis not present

## 2016-07-13 DIAGNOSIS — E1140 Type 2 diabetes mellitus with diabetic neuropathy, unspecified: Secondary | ICD-10-CM | POA: Diagnosis not present

## 2016-07-13 DIAGNOSIS — I10 Essential (primary) hypertension: Secondary | ICD-10-CM | POA: Diagnosis not present

## 2016-07-13 LAB — POCT UA - MICROALBUMIN: Microalbumin Ur, POC: 20 mg/L

## 2016-07-13 LAB — POCT GLYCOSYLATED HEMOGLOBIN (HGB A1C)
Est. average glucose Bld gHb Est-mCnc: 148
Hemoglobin A1C: 6.8

## 2016-07-13 MED ORDER — OXYCODONE-ACETAMINOPHEN 5-325 MG PO TABS: 1.0000 | ORAL_TABLET | Freq: Two times a day (BID) | ORAL | 0 refills | Status: DC | PRN

## 2016-07-13 NOTE — Progress Notes (Signed)
Patient: Erica Rivas Female    DOB: Jan 05, 1941   76 y.o.   MRN: [id number] Visit Date: 07/13/2016  Today's Provider: Lelon Huh, MD   Chief Complaint  Patient presents with  . Hypertension    follow up  . Diabetes    follow up  . COPD    follow up  . Chronic Kidney Disease    follow up   Subjective:    HPI  Hypertension, follow-up:  BP Readings from Last 3 Encounters:  04/16/16 (!) 180/88  03/02/16 140/80  01/08/16 (!) 150/70    She was last seen for hypertension 4 months ago.  BP at that visit was 140/80. Management since that visit includes no changes. She reports good compliance with treatment. She is not having side effects.  She is not exercising. She is adherent to low salt diet.   Outside blood pressures are checked occasionally. She is experiencing dyspnea and lower extremity edema.  Patient denies chest pain, chest pressure/discomfort, claudication, exertional chest pressure/discomfort, fatigue, irregular heart beat, near-syncope, orthopnea, palpitations, paroxysmal nocturnal dyspnea, syncope and tachypnea.   Cardiovascular risk factors include advanced age (older than 1 for men, 28 for women), diabetes mellitus and hypertension.  Use of agents associated with hypertension: NSAIDS.     Weight trend: stable Wt Readings from Last 3 Encounters:  04/16/16 162 lb 6 oz (73.7 kg)  03/02/16 159 lb (72.1 kg)  01/08/16 158 lb (71.7 kg)    Current diet: well balanced  ------------------------------------------------------------------------  Diabetes Mellitus Type II, Follow-up:   Lab Results  Component Value Date   HGBA1C 6.7 12/20/2015   HGBA1C 6.4 06/21/2015   HGBA1C 5.8 02/18/2015    Last seen for diabetes 6 months ago.  Management since then includes no changes. She reports good compliance with treatment. She is not having side effects.  Current symptoms include visual disturbances and have been stable. Home blood sugar records:  checked occasionally. Patient unsure of the readings  Episodes of hypoglycemia? no   Current Insulin Regimen: none Most Recent Eye Exam: <1 year ago Weight trend: stable Prior visit with dietician: no Current diet: well balanced Current exercise: none  Pertinent Labs:    Component Value Date/Time   CHOL 177 12/24/2015 0751   TRIG 166 (H) 12/24/2015 0751   HDL 40 12/24/2015 0751   LDLCALC 104 (H) 12/24/2015 0751   CREATININE 1.21 (H) 03/02/2016 1203    Wt Readings from Last 3 Encounters:  04/16/16 162 lb 6 oz (73.7 kg)  03/02/16 159 lb (72.1 kg)  01/08/16 158 lb (71.7 kg)    ------------------------------------------------------------------------ Follow up of COPD:  Patient was last seen for this problem 6 months ago. No changes were made. Patient was advised to try to quit smoking. Patient reports this condition is stable on current medications.    Follow up CKD:  Patient was last seen for this problem 6 months ago. Changes made include stopping Enalapril /HCTZ and advising patient to increase water intake.       No Known Allergies   Current Outpatient Prescriptions:  .  albuterol (PROAIR HFA) 108 (90 Base) MCG/ACT inhaler, Inhale 2 puffs into the lungs every 6 (six) hours as needed for wheezing or shortness of breath., Disp: 18 g, Rfl: 2 .  amitriptyline (ELAVIL) 25 MG tablet, TAKE 1 TABLET BY MOUTH AT  BEDTIME, Disp: 90 tablet, Rfl: 4 .  aspirin EC 81 MG tablet, Take 81 mg by mouth daily., Disp: , Rfl:  .  atenolol (TENORMIN) 50 MG tablet, TAKE 1 TABLET BY MOUTH TWO  TIMES DAILY, Disp: 180 tablet, Rfl: 4 .  atorvastatin (LIPITOR) 10 MG tablet, TAKE 1 TABLET BY MOUTH AT  BEDTIME FOR CHOLESTEROL, Disp: 90 tablet, Rfl: 4 .  Cholecalciferol (VITAMIN D) 2000 UNITS tablet, Take 1,000 Units by mouth daily. , Disp: , Rfl:  .  furosemide (LASIX) 20 MG tablet, Take 1 tablet (20 mg total) by mouth daily as needed (swelling). For swelling, Disp: 30 tablet, Rfl: 1 .  glipiZIDE  (GLUCOTROL XL) 10 MG 24 hr tablet, TAKE 1 TABLET BY MOUTH  DAILY, Disp: 90 tablet, Rfl: 4 .  HYDROcodone-homatropine (HYCODAN) 5-1.5 MG/5ML syrup, Take 5 mLs by mouth every 8 (eight) hours as needed for cough., Disp: 120 mL, Rfl: 0 .  lisinopril (PRINIVIL,ZESTRIL) 10 MG tablet, TAKE 1 TABLET BY MOUTH  DAILY, Disp: 90 tablet, Rfl: 4 .  metFORMIN (GLUCOPHAGE) 1000 MG tablet, TAKE 1 TABLET BY MOUTH TWO  TIMES DAILY, Disp: 180 tablet, Rfl: 5 .  montelukast (SINGULAIR) 10 MG tablet, TAKE ONE TABLET BY MOUTH ONCE DAILY, Disp: 30 tablet, Rfl: 12 .  omeprazole (PRILOSEC) 20 MG capsule, Take 1 capsule (20 mg total) by mouth 2 (two) times daily., Disp: 180 capsule, Rfl: 4 .  ONETOUCH DELICA LANCETS 81X MISC, Use to check blood sugar daily, Disp: 100 each, Rfl: 3 .  oxyCODONE-acetaminophen (PERCOCET/ROXICET) 5-325 MG tablet, Take 1 tablet by mouth 2 (two) times daily as needed. For pain, Disp: 60 tablet, Rfl: 0 .  SYMBICORT 160-4.5 MCG/ACT inhaler, INHALE ONE TO TWO PUFFS BY MOUTH TWICE DAILY FOR  COPD, Disp: 1 Inhaler, Rfl: 11 .  vitamin B-12 (CYANOCOBALAMIN) 1000 MCG tablet, Take 1,000 mcg by mouth daily., Disp: , Rfl:   Review of Systems  Constitutional: Negative for appetite change, chills, fatigue and fever.  Eyes: Positive for visual disturbance.  Respiratory: Positive for shortness of breath and wheezing. Negative for chest tightness.   Cardiovascular: Negative for chest pain and palpitations.  Gastrointestinal: Negative for abdominal pain, nausea and vomiting.  Neurological: Negative for dizziness and weakness.    Social History  Substance Use Topics  . Smoking status: Current Every Day Smoker    Packs/day: 0.75    Years: 30.00    Types: Cigarettes  . Smokeless tobacco: Never Used  . Alcohol use No   Objective:   BP (!) 172/90 (BP Location: Left Arm, Patient Position: Sitting, Cuff Size: Normal)   Pulse 63   Temp 97.8 F (36.6 C) (Oral)   Resp 18   Wt 162 lb (73.5 kg)   SpO2 99%  Comment: room air  BMI 26.15 kg/m   Physical Exam   General Appearance:    Alert, cooperative, no distress  Eyes:    PERRL, conjunctiva/corneas clear, EOM's intact       Lungs:     Clear to auscultation bilaterally, respirations unlabored  Heart:    Regular rate and rhythm  Neurologic:   Awake, alert, oriented x 3. No apparent focal neurological           defect.       Results for orders placed or performed in visit on 07/13/16  POCT HgB A1C  Result Value Ref Range   Hemoglobin A1C 6.8    Est. average glucose Bld gHb Est-mCnc 148   POCT UA - Microalbumin  Result Value Ref Range   Microalbumin Ur, POC 20 mg/L   Creatinine, POC n/a mg/dL   Albumin/Creatinine Ratio, Urine, POC  n/a        Assessment & Plan:     1. Type 2 diabetes mellitus with diabetic neuropathy, without long-term current use of insulin (HCC)  - POCT HgB A1C - POCT UA - Microalbumin  2. Renal insufficiency  - Renal function panel  3. Current tobacco use  - CT CHEST LUNG CA SCREEN LOW DOSE W/O CM; Future  4.  Hypertension.  Uncontrolled. Will likely change or increase lisinopril after reviewing labs.   5. Disc disorder of lumbar region  - oxyCODONE-acetaminophen (PERCOCET/ROXICET) 5-325 MG tablet; Take 1 tablet by mouth 2 (two) times daily as needed. For pain  Dispense: 60 tablet; Refill: 0  6. Hyperlipidemia, unspecified hyperlipidemia type  - Lipid panel       Lelon Huh, MD  [facility]

## 2016-07-14 ENCOUNTER — Telehealth: Payer: Self-pay | Admitting: *Deleted

## 2016-07-14 DIAGNOSIS — Z87891 Personal history of nicotine dependence: Secondary | ICD-10-CM

## 2016-07-14 NOTE — Telephone Encounter (Signed)
Received referral for initial lung cancer screening scan. Contacted patient and obtained smoking history,(current, 50 pack year) as well as answering questions related to screening process. Patient denies signs of lung cancer such as weight loss or hemoptysis. Patient denies comorbidity that would prevent curative treatment if lung cancer were found. Patient is tentatively scheduled for shared decision making visit and CT scan on 07/21/16, pending insurance approval from business office.

## 2016-07-14 NOTE — Telephone Encounter (Signed)
Received referral for low dose lung cancer screening CT scan. Voicemail left at phone number listed in EMR for patient to call me back to facilitate scheduling scan.  

## 2016-07-16 DIAGNOSIS — N289 Disorder of kidney and ureter, unspecified: Secondary | ICD-10-CM | POA: Diagnosis not present

## 2016-07-16 DIAGNOSIS — E785 Hyperlipidemia, unspecified: Secondary | ICD-10-CM | POA: Diagnosis not present

## 2016-07-17 LAB — LIPID PANEL
CHOLESTEROL TOTAL: 188 mg/dL (ref 100–199)
Chol/HDL Ratio: 2.9 ratio units (ref 0.0–4.4)
HDL: 64 mg/dL (ref >39)
LDL Calculated: 95 mg/dL (ref 0–99)
TRIGLYCERIDES: 145 mg/dL (ref 0–149)
VLDL Cholesterol Cal: 29 mg/dL (ref 5–40)

## 2016-07-17 LAB — RENAL FUNCTION PANEL
Albumin: 4.4 g/dL (ref 3.5–4.8)
BUN/Creatinine Ratio: 8 — ABNORMAL LOW (ref 12–28)
BUN: 12 mg/dL (ref 8–27)
CALCIUM: 9.8 mg/dL (ref 8.7–10.3)
CHLORIDE: 97 mmol/L (ref 96–106)
CO2: 22 mmol/L (ref 18–29)
CREATININE: 1.49 mg/dL — AB (ref 0.57–1.00)
GFR calc Af Amer: 40 mL/min/{1.73_m2} — ABNORMAL LOW (ref >59)
GFR calc non Af Amer: 34 mL/min/{1.73_m2} — ABNORMAL LOW (ref >59)
GLUCOSE: 113 mg/dL — AB (ref 65–99)
PHOSPHORUS: 4 mg/dL (ref 2.5–4.5)
POTASSIUM: 4.7 mmol/L (ref 3.5–5.2)
SODIUM: 141 mmol/L (ref 134–144)

## 2016-07-20 ENCOUNTER — Other Ambulatory Visit: Payer: Self-pay | Admitting: Emergency Medicine

## 2016-07-20 MED ORDER — ATORVASTATIN CALCIUM 20 MG PO TABS: 20.0000 mg | ORAL_TABLET | Freq: Every day | ORAL | 0 refills | Status: DC

## 2016-07-21 ENCOUNTER — Other Ambulatory Visit: Payer: Self-pay | Admitting: *Deleted

## 2016-07-21 ENCOUNTER — Inpatient Hospital Stay: Payer: Medicare Other | Attending: Oncology | Admitting: Oncology

## 2016-07-21 ENCOUNTER — Encounter: Payer: Self-pay | Admitting: Oncology

## 2016-07-21 ENCOUNTER — Ambulatory Visit: Payer: Medicare Other

## 2016-07-21 ENCOUNTER — Ambulatory Visit
Admission: RE | Admit: 2016-07-21 | Discharge: 2016-07-21 | Disposition: A | Discharge status: Home / Self Care | Payer: Medicare Other | Source: Ambulatory Visit | Attending: Oncology | Admitting: Oncology

## 2016-07-21 DIAGNOSIS — Z87891 Personal history of nicotine dependence: Secondary | ICD-10-CM | POA: Diagnosis not present

## 2016-07-21 DIAGNOSIS — S2243XA Multiple fractures of ribs, bilateral, initial encounter for closed fracture: Secondary | ICD-10-CM | POA: Diagnosis not present

## 2016-07-21 DIAGNOSIS — I358 Other nonrheumatic aortic valve disorders: Secondary | ICD-10-CM | POA: Insufficient documentation

## 2016-07-21 DIAGNOSIS — I2510 Atherosclerotic heart disease of native coronary artery without angina pectoris: Secondary | ICD-10-CM | POA: Insufficient documentation

## 2016-07-21 DIAGNOSIS — Z122 Encounter for screening for malignant neoplasm of respiratory organs: Secondary | ICD-10-CM

## 2016-07-21 DIAGNOSIS — I700 Atherosclerosis of aorta: Secondary | ICD-10-CM | POA: Insufficient documentation

## 2016-07-21 NOTE — Assessment & Plan Note (Signed)
In accordance with CMS guidelines, patient has met eligibility criteria including age, absence of signs or symptoms of lung cancer.  Social History  Substance Use Topics  . Smoking status: Current Every Day Smoker    Packs/day: 1.00    Years: 50.00    Types: Cigarettes  . Smokeless tobacco: Never Used  . Alcohol use No     A shared decision-making session was conducted prior to the performance of CT scan. This includes one or more decision aids, includes benefits and harms of screening, follow-up diagnostic testing, over-diagnosis, false positive rate, and total radiation exposure.  Counseling on the importance of adherence to annual lung cancer LDCT screening, impact of co-morbidities, and ability or willingness to undergo diagnosis and treatment is imperative for compliance of the program.  Counseling on the importance of continued smoking cessation for former smokers; the importance of smoking cessation for current smokers, and information about tobacco cessation interventions have been given to patient including [place] [employer] [facility] and [employer] quit [place] programs.  Written order for lung cancer screening with LDCT has been given to the patient and any and all questions have been answered to the best of my abilities.   Yearly follow up will be coordinated by Shawn Perkins, Thoracic Navigator.  

## 2016-07-21 NOTE — Progress Notes (Signed)
Personal history of tobacco use, presenting hazards to health In accordance with CMS guidelines, patient has met eligibility criteria including age, absence of signs or symptoms of lung cancer.  Social History  Substance Use Topics  . Smoking status: Current Every Day Smoker    Packs/day: 1.00    Years: 50.00    Types: Cigarettes  . Smokeless tobacco: Never Used  . Alcohol use No     A shared decision-making session was conducted prior to the performance of CT scan. This includes one or more decision aids, includes benefits and harms of screening, follow-up diagnostic testing, over-diagnosis, false positive rate, and total radiation exposure.  Counseling on the importance of adherence to annual lung cancer LDCT screening, impact of co-morbidities, and ability or willingness to undergo diagnosis and treatment is imperative for compliance of the program.  Counseling on the importance of continued smoking cessation for former smokers; the importance of smoking cessation for current smokers, and information about tobacco cessation interventions have been given to patient including [place] and [employer] quit [place] programs.  Written order for lung cancer screening with LDCT has been given to the patient and any and all questions have been answered to the best of my abilities.   Yearly follow up will be coordinated by Burgess Estelle, Thoracic Navigator.  Lucendia Herrlich, NP  07/21/16 2:18 PM

## 2016-07-21 NOTE — Progress Notes (Unsigned)
img 

## 2016-07-23 ENCOUNTER — Encounter: Payer: Self-pay | Admitting: Family Medicine

## 2016-07-23 ENCOUNTER — Other Ambulatory Visit: Payer: Self-pay | Admitting: Family Medicine

## 2016-07-23 ENCOUNTER — Telehealth: Payer: Self-pay | Admitting: *Deleted

## 2016-07-23 DIAGNOSIS — I2510 Atherosclerotic heart disease of native coronary artery without angina pectoris: Secondary | ICD-10-CM | POA: Insufficient documentation

## 2016-07-23 DIAGNOSIS — I700 Atherosclerosis of aorta: Secondary | ICD-10-CM | POA: Insufficient documentation

## 2016-07-23 NOTE — Telephone Encounter (Signed)
Notified patient of LDCT lung cancer screening results with recommendation for 12 month follow up imaging. Also notified of incidental finding noted below and is encouraged to discuss further with PCP who will receive this note via Epic. Patient verbalizes understanding.   IMPRESSION: 1. Lung-RADS Category 1S, negative. Continue annual screening with low-dose chest CT without contrast in 12 months. 2. The "S" modifier above refers to potentially clinically significant non lung cancer related findings. 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 Assessment for potential risk factor modification, dietary therapy or pharmacologic therapy may be warranted, if clinically indicated. 3. Multiple rib fractures, bold on the right, and 2 nondisplaced healing rib fractures on the left, as above. 4. There are calcifications of the aortic valve. Echocardiographic correlation for evaluation of potential valvular dysfunction may be warranted if clinically indicated.

## 2016-07-25 ENCOUNTER — Encounter: Payer: Self-pay | Admitting: Family Medicine

## 2016-09-07 ENCOUNTER — Other Ambulatory Visit: Payer: Self-pay | Admitting: Family Medicine

## 2016-09-16 ENCOUNTER — Telehealth: Payer: Self-pay | Admitting: Family Medicine

## 2016-09-16 NOTE — Telephone Encounter (Signed)
Rx was sent to [facility] on 09/07/2016.

## 2016-09-16 NOTE — Telephone Encounter (Signed)
Pt needs refill   atorvastatin (LIPITOR) 20 MG tablet  [facility]   Thanks, Con Memos

## 2016-11-10 ENCOUNTER — Ambulatory Visit: Payer: Medicare Other | Admitting: Family Medicine

## 2016-11-16 ENCOUNTER — Encounter: Payer: Self-pay | Admitting: Family Medicine

## 2016-11-16 ENCOUNTER — Ambulatory Visit (INDEPENDENT_AMBULATORY_CARE_PROVIDER_SITE_OTHER): Payer: Medicare Other | Admitting: Family Medicine

## 2016-11-16 VITALS — BP 180/90 | HR 70 | Temp 98.3°F | Resp 16 | Ht 66.0 in | Wt 157.0 lb

## 2016-11-16 DIAGNOSIS — E7800 Pure hypercholesterolemia, unspecified: Secondary | ICD-10-CM | POA: Diagnosis not present

## 2016-11-16 DIAGNOSIS — Z6825 Body mass index (BMI) 25.0-25.9, adult: Secondary | ICD-10-CM

## 2016-11-16 DIAGNOSIS — N183 Chronic kidney disease, stage 3 unspecified: Secondary | ICD-10-CM

## 2016-11-16 DIAGNOSIS — I10 Essential (primary) hypertension: Secondary | ICD-10-CM | POA: Diagnosis not present

## 2016-11-16 DIAGNOSIS — M519 Unspecified thoracic, thoracolumbar and lumbosacral intervertebral disc disorder: Secondary | ICD-10-CM

## 2016-11-16 DIAGNOSIS — I2510 Atherosclerotic heart disease of native coronary artery without angina pectoris: Secondary | ICD-10-CM | POA: Diagnosis not present

## 2016-11-16 DIAGNOSIS — E1140 Type 2 diabetes mellitus with diabetic neuropathy, unspecified: Secondary | ICD-10-CM

## 2016-11-16 DIAGNOSIS — D519 Vitamin B12 deficiency anemia, unspecified: Secondary | ICD-10-CM | POA: Diagnosis not present

## 2016-11-16 LAB — POCT GLYCOSYLATED HEMOGLOBIN (HGB A1C)
Est. average glucose Bld gHb Est-mCnc: 140
HEMOGLOBIN A1C: 6.5

## 2016-11-16 MED ORDER — ATENOLOL 50 MG PO TABS: 50.0000 mg | ORAL_TABLET | Freq: Two times a day (BID) | ORAL | 4 refills | Status: DC

## 2016-11-16 MED ORDER — BUDESONIDE-FORMOTEROL FUMARATE 160-4.5 MCG/ACT IN AERO: INHALATION_SPRAY | RESPIRATORY_TRACT | 11 refills | Status: DC

## 2016-11-16 MED ORDER — LISINOPRIL-HYDROCHLOROTHIAZIDE 20-12.5 MG PO TABS: 1.0000 | ORAL_TABLET | Freq: Every day | ORAL | 1 refills | Status: DC

## 2016-11-16 MED ORDER — OXYCODONE-ACETAMINOPHEN 5-325 MG PO TABS: 1.0000 | ORAL_TABLET | Freq: Two times a day (BID) | ORAL | 0 refills | Status: DC | PRN

## 2016-11-16 MED ORDER — LISINOPRIL-HYDROCHLOROTHIAZIDE 20-12.5 MG PO TABS
1.0000 | ORAL_TABLET | Freq: Every day | ORAL | 1 refills | Status: DC
Start: 2016-11-16 — End: 2016-11-16

## 2016-11-16 NOTE — Progress Notes (Signed)
Patient: Erica Rivas Female    DOB: 1941/01/02   76 y.o.   MRN: [id number] Visit Date: 11/16/2016  Today's Provider: Lelon Huh, MD   Chief Complaint  Patient presents with  . Follow-up  . Diabetes  . Hypertension  . Hyperlipidemia   Subjective:    HPI   Diabetes Mellitus Type II, Follow-up:   Lab Results  Component Value Date   HGBA1C 6.5 11/16/2016   HGBA1C 6.8 07/13/2016   HGBA1C 6.7 12/20/2015   Last seen for diabetes 5 months ago.  Management since then includes; no changes. She reports good compliance with treatment. She is not having side effects. none Current symptoms include none and have been unchanged. Home blood sugar records: fasting range: not checking  Episodes of hypoglycemia? no   Current Insulin Regimen: n/a Most Recent Eye Exam: ? Weight trend: stable Prior visit with dietician: no Current diet: well balanced Current exercise: house work  ----------------------------------------------------------------   Hypertension, follow-up:  BP Readings from Last 3 Encounters:  11/16/16 (!) 190/100  07/13/16 (!) 172/90  04/16/16 (!) 180/88    She was last seen for hypertension 5 months ago.  BP at that visit was 172/90. Management since that visit includes; no changes. She had been on enalapril/hctz until this was discontinued in August due to elevated creatinine of 2.19 when checked August 8th. .She reports good compliance with treatment. She is not having side effects. none She some exercising. She is adherent to low salt diet.   Outside blood pressures are normal. She is experiencing none.  Patient denies none.   Cardiovascular risk factors include diabetes mellitus.  Use of agents associated with hypertension: none.   BMP Latest Ref Rng & Units 07/16/2016 03/02/2016 01/08/2016  Glucose 65 - 99 mg/dL 113(H) 77 88  BUN 8 - 27 mg/dL 12 11 13   Creatinine 0.57 - 1.00 mg/dL 1.49(H) 1.21(H) 1.26(H)  BUN/Creat Ratio 12 - 28 8(L) 9(L)  10(L)  Sodium 134 - 144 mmol/L 141 140 139  Potassium 3.5 - 5.2 mmol/L 4.7 4.6 4.1  Chloride 96 - 106 mmol/L 97 97 99  CO2 18 - 29 mmol/L 22 27 24   Calcium 8.7 - 10.3 mg/dL 9.8 9.8 10.1    ----------------------------------------------------------------    Lipid/Cholesterol, Follow-up:   Last seen for this 5 months ago.  Management since that visit includes; increased atorvastatin to 20 mg qd.  Last Lipid Panel:    Component Value Date/Time   CHOL 188 07/16/2016 0802   TRIG 145 07/16/2016 0802   HDL 64 07/16/2016 0802   CHOLHDL 2.9 07/16/2016 0802   LDLCALC 95 07/16/2016 0802    She reports good compliance with treatment. She is not having side effects. none  Wt Readings from Last 3 Encounters:  11/16/16 157 lb (71.2 kg)  07/21/16 155 lb (70.3 kg)  07/13/16 162 lb (73.5 kg)    ----------------------------------------------------------------  Renal insufficiency From 07/13/2016-labs checked, showing kidney functions slightly diminished, but improved since taken off enalapril-hctz in august. . Advised to drink more water.  Disc disorder of lumbar region From 07/13/2016-no changes. Refilled oxyCODONE-acetaminophen (PERCOCET/ROXICET) 5-325 MG tablet. She states she takes oxycodone/apap occasionally and remains effective.   No Known Allergies   Current Outpatient Prescriptions:  .  albuterol (PROAIR HFA) 108 (90 Base) MCG/ACT inhaler, Inhale 2 puffs into the lungs every 6 (six) hours as needed for wheezing or shortness of breath., Disp: 18 g, Rfl: 2 .  amitriptyline (ELAVIL) 25 MG tablet, TAKE  1 TABLET BY MOUTH AT  BEDTIME, Disp: 90 tablet, Rfl: 4 .  aspirin EC 81 MG tablet, Take 81 mg by mouth daily., Disp: , Rfl:  .  atenolol (TENORMIN) 50 MG tablet, TAKE 1 TABLET BY MOUTH TWO  TIMES DAILY, Disp: 180 tablet, Rfl: 4 .  atorvastatin (LIPITOR) 20 MG tablet, TAKE 1 TABLET BY MOUTH  DAILY., Disp: 90 tablet, Rfl: 4 .  Cholecalciferol (VITAMIN D) 2000 UNITS tablet, Take  1,000 Units by mouth daily. , Disp: , Rfl:  .  furosemide (LASIX) 20 MG tablet, Take 1 tablet (20 mg total) by mouth daily as needed (swelling). For swelling, Disp: 30 tablet, Rfl: 1 .  glipiZIDE (GLUCOTROL XL) 10 MG 24 hr tablet, TAKE 1 TABLET BY MOUTH  DAILY, Disp: 90 tablet, Rfl: 4 .  lisinopril (PRINIVIL,ZESTRIL) 10 MG tablet, TAKE 1 TABLET BY MOUTH  DAILY, Disp: 90 tablet, Rfl: 4 .  metFORMIN (GLUCOPHAGE) 1000 MG tablet, TAKE 1 TABLET BY MOUTH TWO  TIMES DAILY, Disp: 180 tablet, Rfl: 5 .  montelukast (SINGULAIR) 10 MG tablet, TAKE ONE TABLET BY MOUTH ONCE DAILY, Disp: 30 tablet, Rfl: 12 .  omeprazole (PRILOSEC) 20 MG capsule, Take 1 capsule (20 mg total) by mouth 2 (two) times daily., Disp: 180 capsule, Rfl: 4 .  ONETOUCH DELICA LANCETS 27O MISC, Use to check blood sugar daily, Disp: 100 each, Rfl: 3 .  oxyCODONE-acetaminophen (PERCOCET/ROXICET) 5-325 MG tablet, Take 1 tablet by mouth 2 (two) times daily as needed. For pain, Disp: 60 tablet, Rfl: 0 .  SYMBICORT 160-4.5 MCG/ACT inhaler, INHALE ONE TO TWO PUFFS BY MOUTH TWICE DAILY FOR  COPD, Disp: 1 Inhaler, Rfl: 11 .  vitamin B-12 (CYANOCOBALAMIN) 1000 MCG tablet, Take 1,000 mcg by mouth daily., Disp: , Rfl:   Review of Systems  Constitutional: Negative for appetite change, chills, fatigue and fever.  Respiratory: Negative for chest tightness and shortness of breath.   Cardiovascular: Negative for chest pain and palpitations.  Gastrointestinal: Negative for abdominal pain, nausea and vomiting.  Neurological: Negative for dizziness and weakness.    Social History  Substance Use Topics  . Smoking status: Current Every Day Smoker    Packs/day: 1.00    Years: 50.00    Types: Cigarettes  . Smokeless tobacco: Never Used  . Alcohol use No   Objective:   BP (!) 190/100 (BP Location: Right Arm, Patient Position: Sitting, Cuff Size: Normal)   Pulse 70   Temp 98.3 F (36.8 C) (Oral)   Resp 16   Ht 5\' 6"  (1.676 m)   Wt 157 lb (71.2 kg)    SpO2 98%   BMI 25.34 kg/m  Vitals:   11/16/16 1117 11/16/16 1129  BP: (!) 190/100 (!) 180/90  Pulse: 70   Resp: 16   Temp: 98.3 F (36.8 C)   TempSrc: Oral   SpO2: 98%   Weight: 157 lb (71.2 kg)   Height: 5\' 6"  (1.676 m)      Physical Exam   General Appearance:    Alert, cooperative, no distress  Eyes:    PERRL, conjunctiva/corneas clear, EOM's intact       Lungs:     Clear to auscultation bilaterally, respirations unlabored  Heart:    Regular rate and rhythm  Neurologic:   Awake, alert, oriented x 3. No apparent focal neurological           defect.       Results for orders placed or performed in visit on 11/16/16  POCT  glycosylated hemoglobin (Hb A1C)  Result Value Ref Range   Hemoglobin A1C 6.5    Est. average glucose Bld gHb Est-mCnc 140         Assessment & Plan:     1. Type 2 diabetes mellitus with diabetic neuropathy, without long-term current use of insulin (HCC) Well controlled.  Continue current medications.   - POCT glycosylated hemoglobin (Hb A1C)  2. BMI 25.0-25.9,adult Doing well with diet   3. Atherosclerosis of native coronary artery of native heart without angina pectoris Asymptomatic. Compliant with medication.  Continue aggressive risk factor modification.  Doing well with increased dose of atorvastatin.   4. Benign essential HTN Uncontrolled.  - atenolol (TENORMIN) 50 MG tablet; Take 1 tablet (50 mg total) by mouth 2 (two) times daily.  Dispense: 180 tablet; Refill: 4  5. Chronic kidney disease (CKD), stage III (moderate)  - CBC  6. Disc disorder of lumbar region Doing well with occasional oxycodone/apap which is refilled today.  - oxyCODONE-acetaminophen (PERCOCET/ROXICET) 5-325 MG tablet; Take 1 tablet by mouth 2 (two) times daily as needed. For pain  Dispense: 60 tablet; Refill: 0  7. Anemia due to vitamin B12 deficiency, unspecified B12 deficiency type  - Vitamin B12 - CBC  8. Pure hypercholesterolemia She is tolerating  increased dose of atorvastatin well with no adverse effects.   - Lipid panel - Comprehensive metabolic panel        Lelon Huh, MD  [facility]

## 2016-11-17 ENCOUNTER — Other Ambulatory Visit: Payer: Self-pay | Admitting: Family Medicine

## 2016-11-17 DIAGNOSIS — E7800 Pure hypercholesterolemia, unspecified: Secondary | ICD-10-CM | POA: Diagnosis not present

## 2016-11-17 DIAGNOSIS — N183 Chronic kidney disease, stage 3 (moderate): Secondary | ICD-10-CM | POA: Diagnosis not present

## 2016-11-17 DIAGNOSIS — D519 Vitamin B12 deficiency anemia, unspecified: Secondary | ICD-10-CM | POA: Diagnosis not present

## 2016-11-18 LAB — CBC
Hematocrit: 33.8 % — ABNORMAL LOW (ref 34.0–46.6)
Hemoglobin: 10.5 g/dL — ABNORMAL LOW (ref 11.1–15.9)
MCH: 25.1 pg — ABNORMAL LOW (ref 26.6–33.0)
MCHC: 31.1 g/dL — ABNORMAL LOW (ref 31.5–35.7)
MCV: 81 fL (ref 79–97)
PLATELETS: 293 10*3/uL (ref 150–379)
RBC: 4.18 x10E6/uL (ref 3.77–5.28)
RDW: 17.3 % — ABNORMAL HIGH (ref 12.3–15.4)
WBC: 10.8 10*3/uL (ref 3.4–10.8)

## 2016-11-18 LAB — COMPREHENSIVE METABOLIC PANEL
A/G RATIO: 1.5 (ref 1.2–2.2)
ALK PHOS: 91 IU/L (ref 39–117)
ALT: 13 IU/L (ref 0–32)
AST: 16 IU/L (ref 0–40)
Albumin: 4.3 g/dL (ref 3.5–4.8)
BUN/Creatinine Ratio: 8 — ABNORMAL LOW (ref 12–28)
BUN: 11 mg/dL (ref 8–27)
Bilirubin Total: 0.3 mg/dL (ref 0.0–1.2)
CO2: 25 mmol/L (ref 20–29)
Calcium: 10.2 mg/dL (ref 8.7–10.3)
Chloride: 99 mmol/L (ref 96–106)
Creatinine, Ser: 1.3 mg/dL — ABNORMAL HIGH (ref 0.57–1.00)
GFR calc Af Amer: 47 mL/min/{1.73_m2} — ABNORMAL LOW (ref >59)
GFR calc non Af Amer: 41 mL/min/{1.73_m2} — ABNORMAL LOW (ref >59)
Globulin, Total: 2.8 g/dL (ref 1.5–4.5)
Glucose: 107 mg/dL — ABNORMAL HIGH (ref 65–99)
POTASSIUM: 5.2 mmol/L (ref 3.5–5.2)
Sodium: 140 mmol/L (ref 134–144)
Total Protein: 7.1 g/dL (ref 6.0–8.5)

## 2016-11-18 LAB — LIPID PANEL
CHOL/HDL RATIO: 2.8 ratio (ref 0.0–4.4)
Cholesterol, Total: 150 mg/dL (ref 100–199)
HDL: 53 mg/dL (ref >39)
LDL CALC: 72 mg/dL (ref 0–99)
TRIGLYCERIDES: 126 mg/dL (ref 0–149)
VLDL Cholesterol Cal: 25 mg/dL (ref 5–40)

## 2016-11-18 LAB — VITAMIN B12: Vitamin B-12: >2000 pg/mL — ABNORMAL HIGH (ref 232–1245)

## 2016-11-19 NOTE — Progress Notes (Signed)
ADVISED  ED

## 2016-12-25 ENCOUNTER — Other Ambulatory Visit: Payer: Self-pay | Admitting: Family Medicine

## 2016-12-29 ENCOUNTER — Ambulatory Visit (INDEPENDENT_AMBULATORY_CARE_PROVIDER_SITE_OTHER): Payer: Medicare Other | Admitting: Ophthalmology

## 2016-12-29 DIAGNOSIS — H35033 Hypertensive retinopathy, bilateral: Secondary | ICD-10-CM

## 2016-12-29 DIAGNOSIS — I10 Essential (primary) hypertension: Secondary | ICD-10-CM | POA: Diagnosis not present

## 2016-12-29 DIAGNOSIS — H3509 Other intraretinal microvascular abnormalities: Secondary | ICD-10-CM | POA: Diagnosis not present

## 2016-12-29 DIAGNOSIS — H43813 Vitreous degeneration, bilateral: Secondary | ICD-10-CM

## 2016-12-29 DIAGNOSIS — H35373 Puckering of macula, bilateral: Secondary | ICD-10-CM

## 2017-01-19 ENCOUNTER — Ambulatory Visit (INDEPENDENT_AMBULATORY_CARE_PROVIDER_SITE_OTHER): Payer: Medicare Other

## 2017-01-19 ENCOUNTER — Ambulatory Visit: Payer: Medicare Other | Admitting: Family Medicine

## 2017-01-19 VITALS — BP 148/66 | HR 88 | Temp 99.1°F | Ht 66.0 in | Wt 155.8 lb

## 2017-01-19 DIAGNOSIS — Z23 Encounter for immunization: Secondary | ICD-10-CM | POA: Diagnosis not present

## 2017-01-19 DIAGNOSIS — N289 Disorder of kidney and ureter, unspecified: Secondary | ICD-10-CM

## 2017-01-19 DIAGNOSIS — Z0000 Encounter for general adult medical examination without abnormal findings: Secondary | ICD-10-CM

## 2017-01-19 DIAGNOSIS — I10 Essential (primary) hypertension: Secondary | ICD-10-CM

## 2017-01-19 NOTE — Progress Notes (Signed)
Patient: Erica Rivas Female    DOB: 1941/05/16   76 y.o.   MRN: [id number] Visit Date: 01/19/2017  Today's Provider: Lelon Huh, MD   Chief Complaint  Patient presents with  . Hypertension   Subjective:    HPI  Hypertension, follow-up:  BP Readings from Last 3 Encounters:  01/19/17 (!) 148/66  11/16/16 (!) 180/90  07/13/16 (!) 172/90    She was last seen for hypertension 2 months ago.  BP at that visit was 190/100. At that time lisinopril was changed to lisinopril-hctz which she is tolerating well.  She reports good compliance with treatment. She is not having side effects.  She is not exercising. She is adherent to low salt diet.   Outside blood pressures are checked occasionally, usually 140s.60s.  She is experiencing none.  Patient denies exertional chest pressure/discomfort, lower extremity edema and palpitations.   Cardiovascular risk factors include diabetes mellitus and dyslipidemia.    Weight trend: stable Wt Readings from Last 3 Encounters:  01/19/17 155 lb 12.8 oz (70.7 kg)  11/16/16 157 lb (71.2 kg)  07/21/16 155 lb (70.3 kg)    Current diet: well balanced    No Known Allergies   Current Outpatient Prescriptions:  .  albuterol (PROAIR HFA) 108 (90 Base) MCG/ACT inhaler, Inhale 2 puffs into the lungs every 6 (six) hours as needed for wheezing or shortness of breath., Disp: 18 g, Rfl: 2 .  amitriptyline (ELAVIL) 25 MG tablet, TAKE 1 TABLET BY MOUTH AT  BEDTIME, Disp: 90 tablet, Rfl: 4 .  aspirin EC 81 MG tablet, Take 81 mg by mouth daily., Disp: , Rfl:  .  atenolol (TENORMIN) 50 MG tablet, Take 1 tablet (50 mg total) by mouth 2 (two) times daily., Disp: 180 tablet, Rfl: 4 .  atorvastatin (LIPITOR) 20 MG tablet, TAKE 1 TABLET BY MOUTH  DAILY., Disp: 90 tablet, Rfl: 4 .  budesonide-formoterol (SYMBICORT) 160-4.5 MCG/ACT inhaler, INHALE ONE TO TWO PUFFS BY MOUTH TWICE DAILY FOR  COPD, Disp: 1 Inhaler, Rfl: 11 .  Cholecalciferol (VITAMIN D) 2000  UNITS tablet, Take 1,000 Units by mouth daily. , Disp: , Rfl:  .  furosemide (LASIX) 20 MG tablet, Take 1 tablet (20 mg total) by mouth daily as needed (swelling). For swelling, Disp: 30 tablet, Rfl: 1 .  glipiZIDE (GLUCOTROL XL) 10 MG 24 hr tablet, TAKE 1 TABLET BY MOUTH  DAILY, Disp: 90 tablet, Rfl: 4 .  lisinopril-hydrochlorothiazide (ZESTORETIC) 20-12.5 MG tablet, Take 1 tablet by mouth daily., Disp: 90 tablet, Rfl: 1 .  metFORMIN (GLUCOPHAGE) 1000 MG tablet, TAKE 1 TABLET BY MOUTH TWO  TIMES DAILY, Disp: 180 tablet, Rfl: 5 .  montelukast (SINGULAIR) 10 MG tablet, TAKE ONE TABLET BY MOUTH ONCE DAILY, Disp: 30 tablet, Rfl: 12 .  omeprazole (PRILOSEC) 20 MG capsule, TAKE 1 CAPSULE BY MOUTH TWO TIMES DAILY, Disp: 180 capsule, Rfl: 4 .  ONETOUCH DELICA LANCETS 59F MISC, Use to check blood sugar daily, Disp: 100 each, Rfl: 3 .  oxyCODONE-acetaminophen (PERCOCET/ROXICET) 5-325 MG tablet, Take 1 tablet by mouth 2 (two) times daily as needed. For pain, Disp: 60 tablet, Rfl: 0 .  vitamin B-12 (CYANOCOBALAMIN) 1000 MCG tablet, Take 1,000 mcg by mouth daily., Disp: , Rfl:   Review of Systems  Constitutional: Negative.   Respiratory: Negative.   Cardiovascular: Negative.   Endocrine: Negative.   Musculoskeletal: Negative.   Neurological: Negative.     Social History  Substance Use Topics  . Smoking status:  Current Every Day Smoker    Packs/day: 1.00    Years: 50.00    Types: Cigarettes  . Smokeless tobacco: Never Used  . Alcohol use No   Objective:   BP    148/66 (BP Location: Left Arm)     Pulse  88     Temp  99.1 F (37.3 C) (Oral)     Ht  5\' 6"  (1.676 m)     Wt  155 lb 12.8 oz (70.7 kg)      BMI  25.15 kg/m      Physical Exam   General Appearance:    Alert, cooperative, no distress  Eyes:    PERRL, conjunctiva/corneas clear, EOM's intact       Lungs:     Clear to auscultation bilaterally, respirations unlabored  Heart:    Regular rate and rhythm  Neurologic:    Awake, alert, oriented x 3. No apparent focal neurological           defect.           Assessment & Plan:     1. Benign essential HTN BP improved with change from lisinopril to lisinopril-hctz made in July. Tolerating well. Continue current medications.    2. Renal insufficiency Check renal panel before next follow up in November.        Lelon Huh, MD  [facility]

## 2017-01-19 NOTE — Progress Notes (Signed)
Subjective:   Erica Rivas is a 76 y.o. female who presents for Medicare Annual (Subsequent) preventive examination.  Review of Systems:  N/A  Cardiac Risk Factors include: advanced age (>33men, >9 women);diabetes mellitus;dyslipidemia;hypertension     Objective:     Vitals: There were no vitals taken for this visit.  There is no height or weight on file to calculate BMI.   Tobacco History  Smoking Status  . Current Every Day Smoker  . Packs/day: 1.00  . Years: 50.00  . Types: Cigarettes  Smokeless Tobacco  . Never Used     Ready to quit: Not Answered Counseling given: Not Answered   Past Medical History:  Diagnosis Date  . Anemia   . Cancer (HCC)    skin cancer - SCC  . Chronic kidney disease   . Colon polyps   . COPD (chronic obstructive pulmonary disease) ([place])   . Depression   . 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 Diabetes mellitus without complication ([place])   . GERD (gastroesophageal reflux disease)   . Hemorrhoids   . History of chicken pox   . History of measles   . History of mumps   . Hyperlipidemia   . Hypertension   . Lung nodule 02/18/2015   No additional follow up needed per CT report 03/05/2015   . Osteopenia    Past Surgical History:  Procedure Laterality Date  . ABDOMINAL HYSTERECTOMY  1985   DUB; ovaries resected/ removed  . BREAST BIOPSY Right 11/12/2014   fibroadenomatous  . CARDIOVASCULAR STRESS TEST  02/2008   low risk  . DOPPLER ECHOCARDIOGRAPHY  01/2010   EF>55%, LV relaxation impairment consistent with diastolic dysfunction, mild TR and MR  . ESOPHAGOGASTRODUODENOSCOPY  07/2009   +diffuse gastritis. Iftikhar  . GALLBLADDER SURGERY  1994  . HAND SURGERY Right 2010   surgery x 2. Dr. Sabra Heck  . NECK SURGERY  1998   Disc surgey on neck  . Sinus Surgery    . TUBAL LIGATION  1971   Family History  Problem Relation Age of Onset  . Hypertension Sister   . COPD Sister   . Thyroid disease Sister   . Breast cancer Sister 54  .  Thyroid disease Sister   . Heart attack Mother   . Hypertension Mother   . Thyroid disease Mother        HYPERTHYROIDISM  . GI Bleed Father   . Lung cancer Brother   . Breast cancer Sister   . Hypertension Sister   . Aneurysm Sister        in the back of her neck  . Heart attack Brother        CABG   History  Sexual Activity  . Sexual activity: Not on file    Outpatient Encounter Prescriptions as of 01/19/2017  Medication Sig  . albuterol (PROAIR HFA) 108 (90 Base) MCG/ACT inhaler Inhale 2 puffs into the lungs every 6 (six) hours as needed for wheezing or shortness of breath.  Marland Kitchen amitriptyline (ELAVIL) 25 MG tablet TAKE 1 TABLET BY MOUTH AT  BEDTIME  . aspirin EC 81 MG tablet Take 81 mg by mouth daily.  Marland Kitchen atenolol (TENORMIN) 50 MG tablet Take 1 tablet (50 mg total) by mouth 2 (two) times daily.  Marland Kitchen atorvastatin (LIPITOR) 20 MG tablet TAKE 1 TABLET BY MOUTH  DAILY.  . budesonide-formoterol (SYMBICORT) 160-4.5 MCG/ACT inhaler INHALE ONE TO TWO PUFFS BY MOUTH TWICE DAILY FOR  COPD  . Cholecalciferol (VITAMIN D) 2000 UNITS  tablet Take 1,000 Units by mouth daily.   . furosemide (LASIX) 20 MG tablet Take 1 tablet (20 mg total) by mouth daily as needed (swelling). For swelling  . glipiZIDE (GLUCOTROL XL) 10 MG 24 hr tablet TAKE 1 TABLET BY MOUTH  DAILY  . lisinopril-hydrochlorothiazide (ZESTORETIC) 20-12.5 MG tablet Take 1 tablet by mouth daily.  . metFORMIN (GLUCOPHAGE) 1000 MG tablet TAKE 1 TABLET BY MOUTH TWO  TIMES DAILY  . montelukast (SINGULAIR) 10 MG tablet TAKE ONE TABLET BY MOUTH ONCE DAILY  . omeprazole (PRILOSEC) 20 MG capsule TAKE 1 CAPSULE BY MOUTH TWO TIMES DAILY  . ONETOUCH DELICA LANCETS 48N MISC Use to check blood sugar daily  . oxyCODONE-acetaminophen (PERCOCET/ROXICET) 5-325 MG tablet Take 1 tablet by mouth 2 (two) times daily as needed. For pain  . vitamin B-12 (CYANOCOBALAMIN) 1000 MCG tablet Take 1,000 mcg by mouth daily.  . [DISCONTINUED] SYMBICORT 160-4.5 MCG/ACT  inhaler INHALE ONE TO TWO PUFFS BY MOUTH TWICE DAILY FOR  COPD   No facility-administered encounter medications on file as of 01/19/2017.     Activities of Daily Living In your present state of health, do you have any difficulty performing the following activities: 01/19/2017 04/16/2016  Hearing? N N  Vision? Y Y  Comment - f/u with Dr Zigmund Daniel  Difficulty concentrating or making decisions? N N  Walking or climbing stairs? Y N  Dressing or bathing? N N  Doing errands, shopping? N N  Preparing Food and eating ? N N  Using the Toilet? N N  In the past six months, have you accidently leaked urine? N Y  Comment - when coughs  Do you have problems with loss of bowel control? Y Y  Comment occasionally with diarrhea sometimes  Managing your Medications? N N  Managing your Finances? N N  Housekeeping or managing your Housekeeping? N N  Some recent data might be hidden    Patient Care Team: Birdie Sons, MD as PCP - General (Family Medicine) Dasher, Rayvon Char, MD as Consulting Physician (Dermatology) Agapito Games as Consulting Physician (Optometry) Hayden Pedro, MD as Consulting Physician (Ophthalmology)    Assessment:     Exercise Activities and Dietary recommendations Current Exercise Habits: The patient does not participate in regular exercise at present (only house keeping), Exercise limited by: None identified  Goals    . Increase water intake          Starting 04/16/16, I will continue to drink 6 glasses of water a day.      Fall Risk Fall Risk  01/19/2017 04/16/2016 12/20/2015  Falls in the past year? No No No   Depression Screen PHQ 2/9 Scores 01/19/2017 04/16/2016 12/20/2015  PHQ - 2 Score 0 0 0  PHQ- 9 Score - - 1     Cognitive Function- Declined screening today.     6CIT Screen 04/16/2016  What Year? 0 points  What month? 0 points  What time? 0 points  Count back from 20 0 points  Months in reverse 0 points  Repeat phrase 4 points  Total Score 4     Immunization History  Administered Date(s) Administered  . Influenza, High Dose Seasonal PF 02/18/2015, 03/02/2016, 01/19/2017  . Pneumococcal Conjugate-13 10/24/2013  . Pneumococcal Polysaccharide-23 04/01/2005, 05/18/2005, 04/26/2012  . Tdap 01/01/2012  . Zoster 01/01/2012   Screening Tests Health Maintenance  Topic Date Due  . FOOT EXAM  12/19/2016  . OPHTHALMOLOGY EXAM  02/15/2017  . HEMOGLOBIN A1C  05/19/2017  . MAMMOGRAM  12/22/2017  . DEXA SCAN  03/04/2018  . COLONOSCOPY  07/19/2019  . TETANUS/TDAP  12/31/2021  . INFLUENZA VACCINE  Completed  . PNA vac Low Risk Adult  Completed      Plan:  I have personally reviewed and addressed the Medicare Annual Wellness questionnaire and have noted the following in the patient's chart:  A. Medical and social history B. Use of alcohol, tobacco or illicit drugs  C. Current medications and supplements D. Functional ability and status E.  Nutritional status F.  Physical activity G. Advance directives H. List of other physicians I.  Hospitalizations, surgeries, and ER visits in previous 12 months J.  [place] such as hearing and vision if needed, cognitive and depression L. Referrals and appointments - none  In addition, I have reviewed and discussed with patient certain preventive protocols, quality metrics, and best practice recommendations. A written personalized care plan for preventive services as well as general preventive health recommendations were provided to patient.  See attached scanned questionnaire for additional information.   Signed,  Fabio Neighbors, LPN Nurse Health Advisor   MD Recommendations: Needs diabetic foot exam today.

## 2017-01-19 NOTE — Patient Instructions (Signed)
Erica Rivas , Thank you for taking time to come for your Medicare Wellness Visit. I appreciate your ongoing commitment to your health goals. Please review the following plan we discussed and let me know if I can assist you in the future.   Screening recommendations/referrals: Colonoscopy: up to date Mammogram: up to date Bone Density: up to date Recommended yearly ophthalmology/optometry visit for glaucoma screening and checkup Recommended yearly dental visit for hygiene and checkup  Vaccinations: Influenza vaccine: completed today Pneumococcal vaccine: completed series Tdap vaccine: up to date Shingles vaccine: completed 01/01/12  Advanced directives: Advance directive discussed with you today. Even though you declined this today please call our office should you change your mind and we can give you the proper paperwork for you to fill out.  Conditions/risks identified: Smoking cessation; Recommend increasing water intake to 6 glasses a day.  Next appointment: None, needs    Preventive Care 74 Years and Older, Female Preventive care refers to lifestyle choices and visits with your health care provider that can promote health and wellness. What does preventive care include?  A yearly physical exam. This is also called an annual well check.  Dental exams once or twice a year.  Routine eye exams. Ask your health care provider how often you should have your eyes checked.  Personal lifestyle choices, including:  Daily care of your teeth and gums.  Regular physical activity.  Eating a healthy diet.  Avoiding tobacco and drug use.  Limiting alcohol use.  Practicing safe sex.  Taking low-dose aspirin every day.  Taking vitamin and mineral supplements as recommended by your health care provider. What happens during an annual well check? The services and screenings done by your health care provider during your annual well check will depend on your age, overall health,  lifestyle risk factors, and family history of disease. Counseling  Your health care provider may ask you questions about your:  Alcohol use.  Tobacco use.  Drug use.  Emotional well-being.  Home and relationship well-being.  Sexual activity.  Eating habits.  History of falls.  Memory and ability to understand (cognition).  Work and work Statistician.  Reproductive health. Screening  You may have the following tests or measurements:  Height, weight, and BMI.  Blood pressure.  Lipid and cholesterol levels. These may be checked every 5 years, or more frequently if you are over 46 years old.  Skin check.  Lung cancer screening. You may have this screening every year starting at age 81 if you have a 30-pack-year history of smoking and currently smoke or have quit within the past 15 years.  Fecal occult blood test (FOBT) of the stool. You may have this test every year starting at age 87.  Flexible sigmoidoscopy or colonoscopy. You may have a sigmoidoscopy every 5 years or a colonoscopy every 10 years starting at age 72.  Hepatitis C blood test.  Hepatitis B blood test.  Sexually transmitted disease (STD) testing.  Diabetes screening. This is done by checking your blood sugar (glucose) after you have not eaten for a while (fasting). You may have this done every 1-3 years.  Bone density scan. This is done to screen for osteoporosis. You may have this done starting at age 70.  Mammogram. This may be done every 1-2 years. Talk to your health care provider about how often you should have regular mammograms. Talk with your health care provider about your test results, treatment options, and if necessary, the need for more tests. Vaccines  Your health care provider may recommend certain vaccines, such as:  Influenza vaccine. This is recommended every year.  Tetanus, diphtheria, and acellular pertussis (Tdap, Td) vaccine. You may need a Td booster every 10 years.  Zoster  vaccine. You may need this after age 66.  Pneumococcal 13-valent conjugate (PCV13) vaccine. One dose is recommended after age 21.  Pneumococcal polysaccharide (PPSV23) vaccine. One dose is recommended after age 70. Talk to your health care provider about which screenings and vaccines you need and how often you need them. This information is not intended to replace advice given to you by your health care provider. Make sure you discuss any questions you have with your health care provider. Document Released: 05/31/2015 Document Revised: 01/22/2016 Document Reviewed: 03/05/2015 Elsevier Interactive Patient Education  2017 [place] Prevention in the Home Falls can cause injuries. They can happen to people of all ages. There are many things you can do to make your home safe and to help prevent falls. What can I do on the outside of my home?  Regularly fix the edges of walkways and driveways and fix any cracks.  Remove anything that might make you trip as you walk through a door, such as a raised step or threshold.  Trim any bushes or trees on the path to your home.  Use bright outdoor lighting.  Clear any walking paths of anything that might make someone trip, such as rocks or tools.  Regularly check to see if handrails are loose or broken. Make sure that both sides of any steps have handrails.  Any raised decks and porches should have guardrails on the edges.  Have any leaves, snow, or ice cleared regularly.  Use sand or salt on walking paths during winter.  Clean up any spills in your garage right away. This includes oil or grease spills. What can I do in the bathroom?  Use night lights.  Install grab bars by the toilet and in the tub and shower. Do not use towel bars as grab bars.  Use non-skid mats or decals in the tub or shower.  If you need to sit down in the shower, use a plastic, non-slip stool.  Keep the floor dry. Clean up any water that spills on the  floor as soon as it happens.  Remove soap buildup in the tub or shower regularly.  Attach bath mats securely with double-sided non-slip rug tape.  Do not have throw rugs and other things on the floor that can make you trip. What can I do in the bedroom?  Use night lights.  Make sure that you have a light by your bed that is easy to reach.  Do not use any sheets or blankets that are too big for your bed. They should not hang down onto the floor.  Have a firm chair that has side arms. You can use this for support while you get dressed.  Do not have throw rugs and other things on the floor that can make you trip. What can I do in the kitchen?  Clean up any spills right away.  Avoid walking on wet floors.  Keep items that you use a lot in easy-to-reach places.  If you need to reach something above you, use a strong step stool that has a grab bar.  Keep electrical cords out of the way.  Do not use floor polish or wax that makes floors slippery. If you must use wax, use non-skid floor wax.  Do  not have throw rugs and other things on the floor that can make you trip. What can I do with my stairs?  Do not leave any items on the stairs.  Make sure that there are handrails on both sides of the stairs and use them. Fix handrails that are broken or loose. Make sure that handrails are as long as the stairways.  Check any carpeting to make sure that it is firmly attached to the stairs. Fix any carpet that is loose or worn.  Avoid having throw rugs at the top or bottom of the stairs. If you do have throw rugs, attach them to the floor with carpet tape.  Make sure that you have a light switch at the top of the stairs and the bottom of the stairs. If you do not have them, ask someone to add them for you. What else can I do to help prevent falls?  Wear shoes that:  Do not have high heels.  Have rubber bottoms.  Are comfortable and fit you well.  Are closed at the toe. Do not wear  sandals.  If you use a stepladder:  Make sure that it is fully opened. Do not climb a closed stepladder.  Make sure that both sides of the stepladder are locked into place.  Ask someone to hold it for you, if possible.  Clearly mark and make sure that you can see:  Any grab bars or handrails.  First and last steps.  Where the edge of each step is.  Use tools that help you move around (mobility aids) if they are needed. These include:  Canes.  Walkers.  Scooters.  Crutches.  Turn on the lights when you go into a dark area. Replace any light bulbs as soon as they burn out.  Set up your furniture so you have a clear path. Avoid moving your furniture around.  If any of your floors are uneven, fix them.  If there are any pets around you, be aware of where they are.  Review your medicines with your doctor. Some medicines can make you feel dizzy. This can increase your chance of falling. Ask your doctor what other things that you can do to help prevent falls. This information is not intended to replace advice given to you by your health care provider. Make sure you discuss any questions you have with your health care provider. Document Released: 02/28/2009 Document Revised: 10/10/2015 Document Reviewed: 06/08/2014 Elsevier Interactive Patient Education  2017 [employer].

## 2017-02-22 ENCOUNTER — Other Ambulatory Visit: Payer: Self-pay | Admitting: Family Medicine

## 2017-02-22 DIAGNOSIS — Z1231 Encounter for screening mammogram for malignant neoplasm of breast: Secondary | ICD-10-CM

## 2017-03-02 ENCOUNTER — Ambulatory Visit
Admission: RE | Admit: 2017-03-02 | Discharge: 2017-03-02 | Disposition: A | Discharge status: Home / Self Care | Payer: Medicare Other | Source: Ambulatory Visit | Attending: Family Medicine | Admitting: Family Medicine

## 2017-03-02 DIAGNOSIS — Z1231 Encounter for screening mammogram for malignant neoplasm of breast: Secondary | ICD-10-CM | POA: Insufficient documentation

## 2017-03-05 ENCOUNTER — Other Ambulatory Visit: Payer: Self-pay | Admitting: Emergency Medicine

## 2017-03-05 MED ORDER — MONTELUKAST SODIUM 10 MG PO TABS: 10.0000 mg | ORAL_TABLET | Freq: Every day | ORAL | 12 refills | Status: DC

## 2017-03-05 MED ORDER — ONETOUCH DELICA LANCETS 33G MISC: 3 refills | Status: DC

## 2017-03-09 ENCOUNTER — Other Ambulatory Visit: Payer: Self-pay | Admitting: Family Medicine

## 2017-03-09 MED ORDER — GLUCOSE BLOOD VI STRP: 1.0000 | ORAL_STRIP | 4 refills | Status: DC | PRN

## 2017-03-09 NOTE — Telephone Encounter (Signed)
Pt contacted office for refill request on the following medications:  One Touch Ultra test stripes.  [facility]  [id number]#[phone]

## 2017-03-29 ENCOUNTER — Ambulatory Visit: Payer: Self-pay | Admitting: Family Medicine

## 2017-04-06 ENCOUNTER — Other Ambulatory Visit: Payer: Self-pay | Admitting: Family Medicine

## 2017-04-06 DIAGNOSIS — I10 Essential (primary) hypertension: Secondary | ICD-10-CM

## 2017-04-11 ENCOUNTER — Other Ambulatory Visit: Payer: Self-pay | Admitting: Family Medicine

## 2017-05-25 ENCOUNTER — Encounter: Payer: Self-pay | Admitting: Family Medicine

## 2017-05-25 ENCOUNTER — Ambulatory Visit (INDEPENDENT_AMBULATORY_CARE_PROVIDER_SITE_OTHER): Payer: Medicare Other | Admitting: Family Medicine

## 2017-05-25 VITALS — BP 140/62 | HR 78 | Temp 98.5°F | Resp 16 | Wt 158.0 lb

## 2017-05-25 DIAGNOSIS — E1140 Type 2 diabetes mellitus with diabetic neuropathy, unspecified: Secondary | ICD-10-CM

## 2017-05-25 DIAGNOSIS — I700 Atherosclerosis of aorta: Secondary | ICD-10-CM

## 2017-05-25 DIAGNOSIS — N183 Chronic kidney disease, stage 3 unspecified: Secondary | ICD-10-CM

## 2017-05-25 DIAGNOSIS — M519 Unspecified thoracic, thoracolumbar and lumbosacral intervertebral disc disorder: Secondary | ICD-10-CM | POA: Diagnosis not present

## 2017-05-25 DIAGNOSIS — I10 Essential (primary) hypertension: Secondary | ICD-10-CM | POA: Diagnosis not present

## 2017-05-25 DIAGNOSIS — Z720 Tobacco use: Secondary | ICD-10-CM

## 2017-05-25 DIAGNOSIS — I2510 Atherosclerotic heart disease of native coronary artery without angina pectoris: Secondary | ICD-10-CM

## 2017-05-25 LAB — POCT GLYCOSYLATED HEMOGLOBIN (HGB A1C)
ESTIMATED AVERAGE GLUCOSE: 146
Hemoglobin A1C: 6.7

## 2017-05-25 MED ORDER — OXYCODONE-ACETAMINOPHEN 5-325 MG PO TABS: 1.0000 | ORAL_TABLET | Freq: Two times a day (BID) | ORAL | 0 refills | Status: DC | PRN

## 2017-05-25 MED ORDER — FUROSEMIDE 20 MG PO TABS: 20.0000 mg | ORAL_TABLET | Freq: Every day | ORAL | 5 refills | Status: DC | PRN

## 2017-05-25 MED ORDER — AMITRIPTYLINE HCL 25 MG PO TABS: 25.0000 mg | ORAL_TABLET | Freq: Every day | ORAL | 4 refills | Status: DC

## 2017-05-25 NOTE — Progress Notes (Signed)
Patient: Erica Rivas Female    DOB: 08/03/1940   77 y.o.   MRN: [id number] Visit Date: 05/25/2017  Today's Provider: Lelon Huh, MD   Chief Complaint  Patient presents with  . Diabetes    follow up  . Hypertension    follow up   Subjective:    HPI   Diabetes Mellitus Type II, Follow-up:   Lab Results  Component Value Date   HGBA1C 6.5 11/16/2016   HGBA1C 6.8 07/13/2016   HGBA1C 6.7 12/20/2015   Last seen for diabetes 6 months ago.  Management since then includes; no changes. She reports good compliance with treatment. She is not having side effects.  Current symptoms include paresthesia of the feet and polydipsia and have been stable. Home blood sugar records: fasting range: 90-110  Episodes of hypoglycemia? no   Current Insulin Regimen: none Most Recent Eye Exam: 1 year ago Weight trend: stable Prior visit with dietician: no Current diet: well balanced Current exercise: housecleaning  ------------------------------------------------------------------------   Hypertension, follow-up:  BP Readings from Last 3 Encounters:  01/19/17 (!) 148/66  11/16/16 (!) 180/90  07/13/16 (!) 172/90    She was last seen for hypertension 6 months ago.  BP at that visit was 144/66. Management since that visit includes; no changes.She reports good compliance with treatment. She is not having side effects.  She is exercising. She is adherent to low salt diet.   Outside blood pressures are checked occasionally at home. She is experiencing dyspnea.  Patient denies chest pain, chest pressure/discomfort, claudication, exertional chest pressure/discomfort, fatigue, irregular heart beat, near-syncope, orthopnea, palpitations, paroxysmal nocturnal dyspnea, syncope and tachypnea.   Cardiovascular risk factors include advanced age (older than 39 for men, 83 for women), diabetes mellitus, hypertension and smoking/ tobacco exposure.  Use of agents associated with  hypertension: NSAIDS.   ------------------------------------------------------------------------  Renal insufficiency From 01/19/2017-no changes. Check renal panel before next follow up in November.   Disc disorder of lumbar region From 11/16/2016-no changes. Refilled oxyCODONE-acetaminophen (PERCOCET/ROXICET) 5-325 MG tablet. Patient reports good compliance with treatment, good tolerance and fair symptom control. Takes one tablet occasionally, but not on most days.   Follow up COPD She continues on Bid Symbicort which she feels is working well. Continues to smoke but is trying to cut back.     No Known Allergies   Current Outpatient Medications:  .  amitriptyline (ELAVIL) 25 MG tablet, TAKE 1 TABLET BY MOUTH AT  BEDTIME, Disp: 90 tablet, Rfl: 4 .  aspirin EC 81 MG tablet, Take 81 mg by mouth daily., Disp: , Rfl:  .  atenolol (TENORMIN) 50 MG tablet, Take 1 tablet (50 mg total) by mouth 2 (two) times daily., Disp: 180 tablet, Rfl: 4 .  atorvastatin (LIPITOR) 20 MG tablet, TAKE 1 TABLET BY MOUTH  DAILY., Disp: 90 tablet, Rfl: 4 .  budesonide-formoterol (SYMBICORT) 160-4.5 MCG/ACT inhaler, INHALE ONE TO TWO PUFFS BY MOUTH TWICE DAILY FOR  COPD, Disp: 1 Inhaler, Rfl: 11 .  Cholecalciferol (VITAMIN D) 2000 UNITS tablet, Take 1,000 Units by mouth daily. , Disp: , Rfl:  .  furosemide (LASIX) 20 MG tablet, Take 1 tablet (20 mg total) by mouth daily as needed (swelling). For swelling, Disp: 30 tablet, Rfl: 1 .  glipiZIDE (GLUCOTROL XL) 10 MG 24 hr tablet, TAKE 1 TABLET BY MOUTH  DAILY, Disp: 90 tablet, Rfl: 4 .  glucose blood test strip, 1 each by Other route as needed for other. One Touch Ultra Test  Strips, Disp: 100 each, Rfl: 4 .  lisinopril-hydrochlorothiazide (PRINZIDE,ZESTORETIC) 20-12.5 MG tablet, TAKE 1 TABLET BY MOUTH  DAILY, Disp: 90 tablet, Rfl: 4 .  metFORMIN (GLUCOPHAGE) 1000 MG tablet, TAKE 1 TABLET BY MOUTH TWO  TIMES DAILY, Disp: 180 tablet, Rfl: 5 .  montelukast (SINGULAIR) 10 MG  tablet, Take 1 tablet (10 mg total) by mouth daily., Disp: 30 tablet, Rfl: 12 .  omeprazole (PRILOSEC) 20 MG capsule, TAKE 1 CAPSULE BY MOUTH TWO TIMES DAILY, Disp: 180 capsule, Rfl: 4 .  ONETOUCH DELICA LANCETS 27P MISC, Use to check blood sugar daily. Type 2 diabetes E11.9, Disp: 100 each, Rfl: 3 .  oxyCODONE-acetaminophen (PERCOCET/ROXICET) 5-325 MG tablet, Take 1 tablet by mouth 2 (two) times daily as needed. For pain, Disp: 60 tablet, Rfl: 0 .  PROAIR HFA 108 (90 Base) MCG/ACT inhaler, INHALE TWO PUFFS BY MOUTH EVERY 6 HOURS AS NEEDED FOR WHEEZING OR  SHORTNESS  OF  BREATH, Disp: 9 each, Rfl: 5 .  vitamin B-12 (CYANOCOBALAMIN) 1000 MCG tablet, Take 1,000 mcg by mouth daily., Disp: , Rfl:   Review of Systems  Constitutional: Negative for appetite change, chills, fatigue and fever.  Respiratory: Positive for shortness of breath (unchanged from baseline). Negative for chest tightness.   Cardiovascular: Positive for leg swelling (in ankles and hands). Negative for chest pain and palpitations.  Gastrointestinal: Negative for abdominal pain, nausea and vomiting.  Endocrine: Positive for polydipsia.  Neurological: Positive for numbness (tingling in hands, feet and ankles). Negative for dizziness and weakness.    Social History   Tobacco Use  . Smoking status: Current Every Day Smoker    Packs/day: 1.00    Years: 50.00    Pack years: 50.00    Types: Cigarettes  . Smokeless tobacco: Never Used  Substance Use Topics  . Alcohol use: No    Alcohol/week: 0.0 oz   Objective:   BP (!) 148/62 (BP Location: Right Arm, Cuff Size: Normal)   Pulse 78   Temp 98.5 F (36.9 C) (Oral)   Resp 16   Wt 158 lb (71.7 kg)   BMI 25.50 kg/m  Vitals:   05/25/17 1116 05/25/17 1119 05/25/17 1147  BP: (!) 144/64 (!) 148/62 140/62  Pulse: 78    Resp: 16    Temp: 98.5 F (36.9 C)    TempSrc: Oral    Weight: 158 lb (71.7 kg)       Physical Exam   General Appearance:    Alert, cooperative, no  distress  Eyes:    PERRL, conjunctiva/corneas clear, EOM's intact       Lungs:     Clear to auscultation bilaterally, respirations unlabored  Heart:    Regular rate and rhythm  Neurologic:   Awake, alert, oriented x 3. No apparent focal neurological           defect.       Results for orders placed or performed in visit on 05/25/17  POCT HgB A1C  Result Value Ref Range   Hemoglobin A1C 6.7    Est. average glucose Bld gHb Est-mCnc 146        Assessment & Plan:     1. Type 2 diabetes mellitus with diabetic neuropathy, without long-term current use of insulin (HCC) Well controlled.  Continue current medications.   - POCT HgB A1C  2. Disc disorder of lumbar region Pain fairly well controlled on current dose of oxyCODONE-acetaminophen (PERCOCET/ROXICET) 5-325 MG tablet; Take 1 tablet by mouth 2 (two) times daily as  needed. For pain  Dispense: 60 tablet; Refill: 0  3. Benign essential HTN BP a little high. Today. Consider increase hctz after reviewing labs.  - Renal function panel  4. Atherosclerosis of aorta (HCC)  - Lipid panel  5. Atherosclerosis of native coronary artery of native heart without angina pectoris Asymptomatic. Compliant with medication.  Continue aggressive risk factor modification.   - Lipid panel  6. Chronic kidney disease (CKD), stage III (moderate) (HCC)  - Renal function panel  7. Current tobacco use Smoking cessation encouraged. Due for LDCT in march.        Lelon Huh, MD  [facility]

## 2017-05-28 DIAGNOSIS — I10 Essential (primary) hypertension: Secondary | ICD-10-CM | POA: Diagnosis not present

## 2017-05-28 DIAGNOSIS — I2510 Atherosclerotic heart disease of native coronary artery without angina pectoris: Secondary | ICD-10-CM | POA: Diagnosis not present

## 2017-05-28 DIAGNOSIS — I700 Atherosclerosis of aorta: Secondary | ICD-10-CM | POA: Diagnosis not present

## 2017-05-28 DIAGNOSIS — N183 Chronic kidney disease, stage 3 (moderate): Secondary | ICD-10-CM | POA: Diagnosis not present

## 2017-05-29 LAB — RENAL FUNCTION PANEL
Albumin: 4.1 g/dL (ref 3.5–4.8)
BUN / CREAT RATIO: 10 — AB (ref 12–28)
BUN: 14 mg/dL (ref 8–27)
CO2: 26 mmol/L (ref 20–29)
Calcium: 9.8 mg/dL (ref 8.7–10.3)
Chloride: 98 mmol/L (ref 96–106)
Creatinine, Ser: 1.4 mg/dL — ABNORMAL HIGH (ref 0.57–1.00)
GFR, EST AFRICAN AMERICAN: 43 mL/min/{1.73_m2} — AB (ref >59)
GFR, EST NON AFRICAN AMERICAN: 37 mL/min/{1.73_m2} — AB (ref >59)
Glucose: 93 mg/dL (ref 65–99)
Phosphorus: 3.7 mg/dL (ref 2.5–4.5)
Potassium: 4.9 mmol/L (ref 3.5–5.2)
Sodium: 139 mmol/L (ref 134–144)

## 2017-05-29 LAB — LIPID PANEL
CHOLESTEROL TOTAL: 155 mg/dL (ref 100–199)
Chol/HDL Ratio: 2.8 ratio (ref 0.0–4.4)
HDL: 55 mg/dL (ref >39)
LDL Calculated: 75 mg/dL (ref 0–99)
Triglycerides: 125 mg/dL (ref 0–149)
VLDL CHOLESTEROL CAL: 25 mg/dL (ref 5–40)

## 2017-05-31 ENCOUNTER — Telehealth: Payer: Self-pay

## 2017-05-31 NOTE — Telephone Encounter (Signed)
-----   Message from Birdie Sons, MD sent at 05/30/2017  3:59 PM EST ----- Cholesterol well controlled at 155. Normal kidney functions. Continue current medications.  Follow up in July as scheduled.

## 2017-05-31 NOTE — Telephone Encounter (Signed)
Left message to call back  

## 2017-06-01 NOTE — Telephone Encounter (Signed)
Advised patient of results.  

## 2017-07-14 ENCOUNTER — Encounter: Payer: Self-pay | Admitting: Family Medicine

## 2017-07-14 ENCOUNTER — Ambulatory Visit (INDEPENDENT_AMBULATORY_CARE_PROVIDER_SITE_OTHER): Payer: Medicare Other | Admitting: Family Medicine

## 2017-07-14 VITALS — BP 132/62 | HR 80 | Temp 99.1°F | Resp 22 | Wt 158.0 lb

## 2017-07-14 DIAGNOSIS — J449 Chronic obstructive pulmonary disease, unspecified: Secondary | ICD-10-CM

## 2017-07-14 DIAGNOSIS — R05 Cough: Secondary | ICD-10-CM

## 2017-07-14 DIAGNOSIS — R059 Cough, unspecified: Secondary | ICD-10-CM

## 2017-07-14 DIAGNOSIS — J40 Bronchitis, not specified as acute or chronic: Secondary | ICD-10-CM | POA: Diagnosis not present

## 2017-07-14 MED ORDER — PREDNISONE 10 MG PO TABS: ORAL_TABLET | ORAL | 0 refills | Status: AC

## 2017-07-14 MED ORDER — LEVOFLOXACIN 500 MG PO TABS: 500.0000 mg | ORAL_TABLET | Freq: Every day | ORAL | 0 refills | Status: DC

## 2017-07-14 NOTE — Progress Notes (Signed)
Patient: Erica Rivas Female    DOB: [birth year]   77 y.o.   MRN: [id number] Visit Date: 07/14/2017  Today's Provider: Lelon Huh, MD   Chief Complaint  Patient presents with  . Cough   Subjective:    Cough  This is a new problem. Episode onset: 1 week ago. The problem has been gradually worsening. The cough is productive of sputum (thick yellow sputum). Associated symptoms include chills, a fever, headaches, myalgias, postnasal drip, rhinorrhea, a sore throat, shortness of breath, sweats and wheezing. Pertinent negatives include no chest pain, ear congestion, ear pain or nasal congestion. Treatments tried: Coricidin HBP, Advil and cough drops. The treatment provided mild relief.       No Known Allergies   Current Outpatient Medications:  .  amitriptyline (ELAVIL) 25 MG tablet, Take 1 tablet (25 mg total) by mouth at bedtime., Disp: 90 tablet, Rfl: 4 .  aspirin EC 81 MG tablet, Take 81 mg by mouth daily., Disp: , Rfl:  .  atenolol (TENORMIN) 50 MG tablet, Take 1 tablet (50 mg total) by mouth 2 (two) times daily., Disp: 180 tablet, Rfl: 4 .  atorvastatin (LIPITOR) 20 MG tablet, TAKE 1 TABLET BY MOUTH  DAILY., Disp: 90 tablet, Rfl: 4 .  budesonide-formoterol (SYMBICORT) 160-4.5 MCG/ACT inhaler, INHALE ONE TO TWO PUFFS BY MOUTH TWICE DAILY FOR  COPD, Disp: 1 Inhaler, Rfl: 11 .  Cholecalciferol (VITAMIN D) 2000 UNITS tablet, Take 1,000 Units by mouth daily. , Disp: , Rfl:  .  furosemide (LASIX) 20 MG tablet, Take 1 tablet (20 mg total) by mouth daily as needed (swelling). For swelling, Disp: 30 tablet, Rfl: 5 .  glipiZIDE (GLUCOTROL XL) 10 MG 24 hr tablet, TAKE 1 TABLET BY MOUTH  DAILY, Disp: 90 tablet, Rfl: 4 .  glucose blood test strip, 1 each by Other route as needed for other. One Touch Ultra Test Strips, Disp: 100 each, Rfl: 4 .  lisinopril-hydrochlorothiazide (PRINZIDE,ZESTORETIC) 20-12.5 MG tablet, TAKE 1 TABLET BY MOUTH  DAILY, Disp: 90 tablet, Rfl: 4 .  metFORMIN  (GLUCOPHAGE) 1000 MG tablet, TAKE 1 TABLET BY MOUTH TWO  TIMES DAILY, Disp: 180 tablet, Rfl: 5 .  montelukast (SINGULAIR) 10 MG tablet, Take 1 tablet (10 mg total) by mouth daily., Disp: 30 tablet, Rfl: 12 .  omeprazole (PRILOSEC) 20 MG capsule, TAKE 1 CAPSULE BY MOUTH TWO TIMES DAILY, Disp: 180 capsule, Rfl: 4 .  ONETOUCH DELICA LANCETS 85U MISC, Use to check blood sugar daily. Type 2 diabetes E11.9, Disp: 100 each, Rfl: 3 .  oxyCODONE-acetaminophen (PERCOCET/ROXICET) 5-325 MG tablet, Take 1 tablet by mouth 2 (two) times daily as needed. For pain, Disp: 60 tablet, Rfl: 0 .  PROAIR HFA 108 (90 Base) MCG/ACT inhaler, INHALE TWO PUFFS BY MOUTH EVERY 6 HOURS AS NEEDED FOR WHEEZING OR  SHORTNESS  OF  BREATH, Disp: 9 each, Rfl: 5 .  vitamin B-12 (CYANOCOBALAMIN) 1000 MCG tablet, Take 1,000 mcg by mouth daily., Disp: , Rfl:   Review of Systems  Constitutional: Positive for chills, diaphoresis, fatigue and fever. Negative for appetite change.  HENT: Positive for congestion, postnasal drip, rhinorrhea and sore throat. Negative for ear pain.   Respiratory: Positive for cough, chest tightness, shortness of breath and wheezing.   Cardiovascular: Negative for chest pain and palpitations.  Gastrointestinal: Positive for abdominal pain (when coughing). Negative for nausea and vomiting.  Musculoskeletal: Positive for myalgias.  Neurological: Positive for headaches. Negative for dizziness and weakness.    Social  History   Tobacco Use  . Smoking status: Current Every Day Smoker    Packs/day: 1.00    Years: 50.00    Pack years: 50.00    Types: Cigarettes  . Smokeless tobacco: Never Used  Substance Use Topics  . Alcohol use: No    Alcohol/week: 0.0 oz   Objective:   BP 132/62 (BP Location: Left Arm, Patient Position: Sitting, Cuff Size: Normal)   Pulse 80   Temp 99.1 F (37.3 C) (Oral)   Resp (!) 22   Wt 158 lb (71.7 kg)   SpO2 96% Comment: room air  BMI 25.50 kg/m     Physical  Exam  General Appearance:    Alert, cooperative, no distress  HENT:   ENT exam normal, no neck nodes or sinus tenderness  Eyes:    PERRL, conjunctiva/corneas clear, EOM's intact       Lungs:     Occasional expiratory wheeze, no rales,  respirations unlabored  Heart:    Regular rate and rhythm  Neurologic:   Awake, alert, oriented x 3. No apparent focal neurological           defect.           Assessment & Plan:     1. Chronic obstructive pulmonary disease, unspecified COPD type ([place])  - predniSONE (DELTASONE) 10 MG tablet; 6 tablets for 1 day, then 5 for 1 day, then 4 for 1 day, then 3 for 1 day, then 2 for 1 day then 1 for 1 day.  Dispense: 21 tablet; Refill: 0  2. Bronchitis  - levofloxacin (LEVAQUIN) 500 MG tablet; Take 1 tablet (500 mg total) by mouth daily.  Dispense: 7 tablet; Refill: 0 - predniSONE (DELTASONE) 10 MG tablet; 6 tablets for 1 day, then 5 for 1 day, then 4 for 1 day, then 3 for 1 day, then 2 for 1 day then 1 for 1 day.  Dispense: 21 tablet; Refill: 0  3. Cough  - predniSONE (DELTASONE) 10 MG tablet; 6 tablets for 1 day, then 5 for 1 day, then 4 for 1 day, then 3 for 1 day, then 2 for 1 day then 1 for 1 day.  Dispense: 21 tablet; Refill: 0   Call if symptoms change or if not rapidly improving.          Lelon Huh, MD  [facility]

## 2017-07-19 ENCOUNTER — Telehealth: Payer: Self-pay | Admitting: *Deleted

## 2017-07-19 NOTE — Telephone Encounter (Signed)
Attempted to leave message for patient to notify them that it is time to schedule annual low dose lung cancer screening CT scan. However, this option is not available. Will try again at a later date.

## 2017-07-21 ENCOUNTER — Telehealth: Payer: Self-pay | Admitting: *Deleted

## 2017-07-21 DIAGNOSIS — Z87891 Personal history of nicotine dependence: Secondary | ICD-10-CM

## 2017-07-21 DIAGNOSIS — Z122 Encounter for screening for malignant neoplasm of respiratory organs: Secondary | ICD-10-CM

## 2017-07-21 NOTE — Telephone Encounter (Signed)
Notified patient that annual lung cancer screening low dose CT scan is due currently or will be in near future. Confirmed that patient is within the age range of 55-77, and asymptomatic, (no signs or symptoms of lung cancer). Patient denies illness that would prevent curative treatment for lung cancer if found. Verified smoking history, (current, 51 pack year). The shared decision making visit was done 07/21/16. Patient is agreeable for CT scan being scheduled.

## 2017-07-26 ENCOUNTER — Telehealth: Payer: Self-pay | Admitting: Family Medicine

## 2017-07-26 NOTE — Telephone Encounter (Signed)
Pt stated she received a jury request to serve on 08/19/17. Pt would like Dr. Caryn Section to write a letter that because of her age and difficulty following conversations that she shouldn't serve jury duty. Pt stated that she needs to have the letter into the court by 08/12/17. Please advise. Thanks TNP

## 2017-07-27 NOTE — Telephone Encounter (Signed)
Letter has been printed  ?

## 2017-07-28 ENCOUNTER — Ambulatory Visit
Admission: RE | Admit: 2017-07-28 | Discharge: 2017-07-28 | Disposition: A | Discharge status: Home / Self Care | Payer: Medicare Other | Source: Ambulatory Visit | Attending: Oncology | Admitting: Oncology

## 2017-07-28 DIAGNOSIS — I2510 Atherosclerotic heart disease of native coronary artery without angina pectoris: Secondary | ICD-10-CM | POA: Diagnosis not present

## 2017-07-28 DIAGNOSIS — Z87891 Personal history of nicotine dependence: Secondary | ICD-10-CM | POA: Diagnosis not present

## 2017-07-28 DIAGNOSIS — Z122 Encounter for screening for malignant neoplasm of respiratory organs: Secondary | ICD-10-CM | POA: Diagnosis not present

## 2017-07-28 DIAGNOSIS — I700 Atherosclerosis of aorta: Secondary | ICD-10-CM | POA: Diagnosis not present

## 2017-08-02 ENCOUNTER — Telehealth: Payer: Self-pay | Admitting: Family Medicine

## 2017-08-02 ENCOUNTER — Encounter: Payer: Self-pay | Admitting: *Deleted

## 2017-08-02 NOTE — Telephone Encounter (Signed)
Please review. Thanks!  

## 2017-08-02 NOTE — Telephone Encounter (Signed)
Normal, repeat in 1 year.

## 2017-08-02 NOTE — Telephone Encounter (Signed)
Patient wants results of CT scan done on 07/28/17.

## 2017-08-02 NOTE — Telephone Encounter (Signed)
°  Patient is wanting to try Valtrex for fever blisters.   Call into [facility] on [address]

## 2017-08-03 MED ORDER — VALACYCLOVIR HCL 1 G PO TABS
ORAL_TABLET | ORAL | 1 refills | Status: DC
Start: 2017-08-03 — End: 2020-01-26

## 2017-08-03 NOTE — Telephone Encounter (Signed)
Patient stated she has fever blisters on her upper and lower lips. She is requesting an rx for valacyclovir.please advise? [place]

## 2017-08-03 NOTE — Telephone Encounter (Signed)
Patient was advised of results. Expressed understanding.  

## 2017-08-15 ENCOUNTER — Other Ambulatory Visit: Payer: Self-pay | Admitting: Family Medicine

## 2017-09-29 DIAGNOSIS — C44729 Squamous cell carcinoma of skin of left lower limb, including hip: Secondary | ICD-10-CM | POA: Diagnosis not present

## 2017-09-29 DIAGNOSIS — L578 Other skin changes due to chronic exposure to nonionizing radiation: Secondary | ICD-10-CM | POA: Diagnosis not present

## 2017-09-29 DIAGNOSIS — L821 Other seborrheic keratosis: Secondary | ICD-10-CM | POA: Diagnosis not present

## 2017-09-29 DIAGNOSIS — L570 Actinic keratosis: Secondary | ICD-10-CM | POA: Diagnosis not present

## 2017-09-29 DIAGNOSIS — Z85828 Personal history of other malignant neoplasm of skin: Secondary | ICD-10-CM | POA: Diagnosis not present

## 2017-09-29 DIAGNOSIS — C4492 Squamous cell carcinoma of skin, unspecified: Secondary | ICD-10-CM

## 2017-09-29 HISTORY — DX: Squamous cell carcinoma of skin, unspecified: C44.92

## 2017-10-03 ENCOUNTER — Other Ambulatory Visit: Payer: Self-pay | Admitting: Family Medicine

## 2017-11-10 DIAGNOSIS — L570 Actinic keratosis: Secondary | ICD-10-CM | POA: Diagnosis not present

## 2017-11-10 DIAGNOSIS — L820 Inflamed seborrheic keratosis: Secondary | ICD-10-CM | POA: Diagnosis not present

## 2017-11-10 DIAGNOSIS — L578 Other skin changes due to chronic exposure to nonionizing radiation: Secondary | ICD-10-CM | POA: Diagnosis not present

## 2017-11-10 DIAGNOSIS — D692 Other nonthrombocytopenic purpura: Secondary | ICD-10-CM | POA: Diagnosis not present

## 2017-11-10 DIAGNOSIS — Z85828 Personal history of other malignant neoplasm of skin: Secondary | ICD-10-CM | POA: Diagnosis not present

## 2017-11-22 ENCOUNTER — Ambulatory Visit: Payer: Self-pay | Admitting: Family Medicine

## 2017-11-23 ENCOUNTER — Ambulatory Visit (INDEPENDENT_AMBULATORY_CARE_PROVIDER_SITE_OTHER): Payer: Medicare Other | Admitting: Family Medicine

## 2017-11-23 ENCOUNTER — Encounter: Payer: Self-pay | Admitting: Family Medicine

## 2017-11-23 VITALS — BP 144/80 | HR 63 | Temp 98.5°F | Resp 16 | Wt 164.0 lb

## 2017-11-23 DIAGNOSIS — E611 Iron deficiency: Secondary | ICD-10-CM | POA: Diagnosis not present

## 2017-11-23 DIAGNOSIS — E1140 Type 2 diabetes mellitus with diabetic neuropathy, unspecified: Secondary | ICD-10-CM

## 2017-11-23 DIAGNOSIS — E559 Vitamin D deficiency, unspecified: Secondary | ICD-10-CM | POA: Diagnosis not present

## 2017-11-23 DIAGNOSIS — D519 Vitamin B12 deficiency anemia, unspecified: Secondary | ICD-10-CM | POA: Diagnosis not present

## 2017-11-23 DIAGNOSIS — I2510 Atherosclerotic heart disease of native coronary artery without angina pectoris: Secondary | ICD-10-CM

## 2017-11-23 DIAGNOSIS — F17210 Nicotine dependence, cigarettes, uncomplicated: Secondary | ICD-10-CM | POA: Diagnosis not present

## 2017-11-23 DIAGNOSIS — E7800 Pure hypercholesterolemia, unspecified: Secondary | ICD-10-CM | POA: Diagnosis not present

## 2017-11-23 DIAGNOSIS — M519 Unspecified thoracic, thoracolumbar and lumbosacral intervertebral disc disorder: Secondary | ICD-10-CM

## 2017-11-23 LAB — POCT GLYCOSYLATED HEMOGLOBIN (HGB A1C)
ESTIMATED AVERAGE GLUCOSE: 169
Hemoglobin A1C: 7.5 % — AB (ref 4.0–5.6)

## 2017-11-23 MED ORDER — OXYCODONE-ACETAMINOPHEN 5-325 MG PO TABS: 1.0000 | ORAL_TABLET | Freq: Two times a day (BID) | ORAL | 0 refills | Status: DC | PRN

## 2017-11-23 MED ORDER — BUPROPION HCL ER (SR) 150 MG PO TB12: ORAL_TABLET | ORAL | 5 refills | Status: DC

## 2017-11-23 MED ORDER — METFORMIN HCL 1000 MG PO TABS: 1000.0000 mg | ORAL_TABLET | Freq: Every day | ORAL | 0 refills | Status: DC

## 2017-11-23 NOTE — Progress Notes (Signed)
Patient: Erica Rivas Female    DOB: January 30, 1941   77 y.o.   MRN: [id number] Visit Date: 11/23/2017  Today's Provider: Lelon Huh, MD   Chief Complaint  Patient presents with  . Hypertension  . Coronary Artery Disease  . COPD  . Chronic Kidney Disease  . Diabetes   Subjective:    HPI  Hypertension, follow-up:  BP Readings from Last 3 Encounters:  11/23/17 (!) 144/80  07/14/17 132/62  05/25/17 140/62    She was last seen for hypertension 6 months ago.  BP at that visit was 148/62. Management since that visit includes no changes. She reports good compliance with treatment. She is not having side effects.  She is not exercising. She is adherent to low salt diet.   Outside blood pressures are not checked. She is experiencing lower extremity edema.  Patient denies chest pain, chest pressure/discomfort, claudication, exertional chest pressure/discomfort, fatigue, irregular heart beat, near-syncope, palpitations, paroxysmal nocturnal dyspnea, syncope and tachypnea.   Cardiovascular risk factors include advanced age (older than 15 for men, 52 for women), diabetes mellitus and hypertension.  Use of agents associated with hypertension: NSAIDS.     Weight trend: fluctuating a bit Wt Readings from Last 3 Encounters:  11/23/17 164 lb (74.4 kg)  07/28/17 158 lb (71.7 kg)  07/14/17 158 lb (71.7 kg)    Current diet: well balanced  ------------------------------------------------------------------------ Follow up of COPD: Patient was last seen for this problem 5 months ago and was treated with Prednisone and Levaquin. Patient reports this problem is stable with current inhalers.  She states her breathing is unchanged and not limiting activity.   Follow up of CKD: Patient was last seen for this problem 6 months ago and no changes were made.    Diabetes Mellitus Type II, Follow-up:   Lab Results  Component Value Date   HGBA1C 6.7 05/25/2017   HGBA1C 6.5  11/16/2016   HGBA1C 6.8 07/13/2016    Last seen for diabetes 6 months ago.  Management since then includes no changes. She reports good compliance with treatment. She is not having side effects.  Current symptoms include none and have been stable. Home blood sugar records: fasting range: 120's  Episodes of hypoglycemia? no   Current Insulin Regimen: none Most Recent Eye Exam: >1 year ago Weight trend: fluctuating a bit Prior visit with dietician: no Current diet: well balanced Current exercise: none  Pertinent Labs:    Component Value Date/Time   CHOL 155 05/28/2017 0820   TRIG 125 05/28/2017 0820   HDL 55 05/28/2017 0820   LDLCALC 75 05/28/2017 0820   CREATININE 1.40 (H) 05/28/2017 0820    Wt Readings from Last 3 Encounters:  11/23/17 164 lb (74.4 kg)  07/28/17 158 lb (71.7 kg)  07/14/17 158 lb (71.7 kg)    ------------------------------------------------------------------------  Follow up chronic arthritis pain.  She states oxycodone/apap continues to work well, but only needs to take it a few days a week.     No Known Allergies   Current Outpatient Medications:  .  amitriptyline (ELAVIL) 25 MG tablet, Take 1 tablet (25 mg total) by mouth at bedtime., Disp: 90 tablet, Rfl: 4 .  aspirin EC 81 MG tablet, Take 81 mg by mouth daily., Disp: , Rfl:  .  atenolol (TENORMIN) 50 MG tablet, Take 1 tablet (50 mg total) by mouth 2 (two) times daily., Disp: 180 tablet, Rfl: 4 .  atorvastatin (LIPITOR) 20 MG tablet, TAKE 1 TABLET BY  MOUTH  DAILY, Disp: 90 tablet, Rfl: 4 .  budesonide-formoterol (SYMBICORT) 160-4.5 MCG/ACT inhaler, INHALE ONE TO TWO PUFFS BY MOUTH TWICE DAILY FOR  COPD, Disp: 1 Inhaler, Rfl: 11 .  Cholecalciferol (VITAMIN D) 2000 UNITS tablet, Take 1,000 Units by mouth daily. , Disp: , Rfl:  .  furosemide (LASIX) 20 MG tablet, Take 1 tablet (20 mg total) by mouth daily as needed (swelling). For swelling, Disp: 30 tablet, Rfl: 5 .  glipiZIDE (GLUCOTROL XL) 10 MG  24 hr tablet, TAKE 1 TABLET BY MOUTH  DAILY, Disp: 90 tablet, Rfl: 4 .  glucose blood test strip, 1 each by Other route as needed for other. One Touch Ultra Test Strips, Disp: 100 each, Rfl: 4 .  lisinopril-hydrochlorothiazide (PRINZIDE,ZESTORETIC) 20-12.5 MG tablet, TAKE 1 TABLET BY MOUTH  DAILY, Disp: 90 tablet, Rfl: 4 .  metFORMIN (GLUCOPHAGE) 1000 MG tablet, TAKE 1 TABLET BY MOUTH ONE  TIME DAILY, Disp: 180 tablet, Rfl: 4 .  montelukast (SINGULAIR) 10 MG tablet, Take 1 tablet (10 mg total) by mouth daily., Disp: 30 tablet, Rfl: 12 .  omeprazole (PRILOSEC) 20 MG capsule, TAKE 1 CAPSULE BY MOUTH TWO TIMES DAILY, Disp: 180 capsule, Rfl: 4 .  ONETOUCH DELICA LANCETS 81X MISC, Use to check blood sugar daily. Type 2 diabetes E11.9, Disp: 100 each, Rfl: 3 .  oxyCODONE-acetaminophen (PERCOCET/ROXICET) 5-325 MG tablet, Take 1 tablet by mouth 2 (two) times daily as needed. For pain, Disp: 60 tablet, Rfl: 0 .  PROAIR HFA 108 (90 Base) MCG/ACT inhaler, INHALE TWO PUFFS BY MOUTH EVERY 6 HOURS AS NEEDED FOR WHEEZING OR  SHORTNESS  OF  BREATH, Disp: 9 each, Rfl: 5 .  valACYclovir (VALTREX) 1000 MG tablet, 2 tablets twice a day for 1 day as needed for cold sores, Disp: 12 tablet, Rfl: 1 .  vitamin B-12 (CYANOCOBALAMIN) 1000 MCG tablet, Take 1,000 mcg by mouth daily., Disp: , Rfl:   Review of Systems  Constitutional: Negative for appetite change, chills, fatigue and fever.  Respiratory: Negative for chest tightness and shortness of breath.   Cardiovascular: Positive for leg swelling. Negative for chest pain and palpitations.  Gastrointestinal: Negative for abdominal pain, nausea and vomiting.  Neurological: Negative for dizziness and weakness.    Social History   Tobacco Use  . Smoking status: Current Every Day Smoker    Packs/day: 1.00    Years: 50.00    Pack years: 50.00    Types: Cigarettes  . Smokeless tobacco: Never Used  Substance Use Topics  . Alcohol use: No    Alcohol/week: 0.0 oz    Objective:    Vitals:   11/23/17 1130  BP: (!) 144/80  Pulse: 63  Resp: 16  Temp: 98.5 F (36.9 C)  TempSrc: Oral  SpO2: 95%  Weight: 164 lb (74.4 kg)     Physical Exam   General Appearance:    Alert, cooperative, no distress  Eyes:    PERRL, conjunctiva/corneas clear, EOM's intact       Lungs:     Clear to auscultation bilaterally, respirations unlabored  Heart:    Regular rate and rhythm  Neurologic:   Awake, alert, oriented x 3. No apparent focal neurological           defect.       Results for orders placed or performed in visit on 11/23/17  POCT HgB A1C  Result Value Ref Range   Hemoglobin A1C 7.5 (A) 4.0 - 5.6 %   HbA1c POC (<> result, manual  entry)  4.0 - 5.6 %   HbA1c, POC (prediabetic range)  5.7 - 6.4 %   HbA1c, POC (controlled diabetic range)  0.0 - 7.0 %   Est. average glucose Bld gHb Est-mCnc 169    EKG: NSR    Assessment & Plan:     1. Type 2 diabetes mellitus with diabetic neuropathy, without long-term current use of insulin (HCC) A1C is up somewhat. She is to work on reducing starchy foods in diet. If not better at follow up will consider GLPq agonist.  - POCT HgB A1C - EKG 12-Lead  2. Atherosclerosis of native coronary artery of native heart without angina pectoris Asymptomatic. Based on LDCT Compliant with medication.  Continue aggressive risk factor modification.   - EKG 12-Lead. Consider higher intensity statin.   3. Smoking greater than 30 pack years Counseled importance of smoking cessation to reduce risk of heart disease. She didn't tolerate chantix in the past. Will try bupropione  4. Pure hypercholesterolemia She is tolerating atorvastatin well with no adverse effects.  Consider increase to 40 or 80 - EKG 12-Lead - Comprehensive metabolic panel - Lipid panel - CBC  5. Disc disorder of lumbar region Stable. continue- oxyCODONE-acetaminophen (PERCOCET/ROXICET) 5-325 MG tablet; Take 1 tablet by mouth 2 (two) times daily as needed.  For pain  Dispense: 60 tablet; Refill: 0       Lelon Huh, MD  [facility]

## 2017-11-23 NOTE — Patient Instructions (Signed)
   Please contact your eyecare professional to schedule a routine eye exam

## 2017-11-24 ENCOUNTER — Other Ambulatory Visit: Payer: Self-pay | Admitting: Family Medicine

## 2017-11-24 LAB — COMPREHENSIVE METABOLIC PANEL
ALBUMIN: 4.4 g/dL (ref 3.5–4.8)
ALT: 13 IU/L (ref 0–32)
AST: 16 IU/L (ref 0–40)
Albumin/Globulin Ratio: 1.8 (ref 1.2–2.2)
Alkaline Phosphatase: 84 IU/L (ref 39–117)
BILIRUBIN TOTAL: 0.4 mg/dL (ref 0.0–1.2)
BUN/Creatinine Ratio: 12 (ref 12–28)
BUN: 17 mg/dL (ref 8–27)
CALCIUM: 10.1 mg/dL (ref 8.7–10.3)
CO2: 25 mmol/L (ref 20–29)
CREATININE: 1.47 mg/dL — AB (ref 0.57–1.00)
Chloride: 97 mmol/L (ref 96–106)
GFR, EST AFRICAN AMERICAN: 40 mL/min/{1.73_m2} — AB (ref >59)
GFR, EST NON AFRICAN AMERICAN: 35 mL/min/{1.73_m2} — AB (ref >59)
GLUCOSE: 79 mg/dL (ref 65–99)
Globulin, Total: 2.4 g/dL (ref 1.5–4.5)
Potassium: 4.8 mmol/L (ref 3.5–5.2)
Sodium: 138 mmol/L (ref 134–144)
TOTAL PROTEIN: 6.8 g/dL (ref 6.0–8.5)

## 2017-11-24 LAB — LIPID PANEL
CHOL/HDL RATIO: 3 ratio (ref 0.0–4.4)
Cholesterol, Total: 159 mg/dL (ref 100–199)
HDL: 53 mg/dL (ref >39)
LDL CALC: 80 mg/dL (ref 0–99)
Triglycerides: 132 mg/dL (ref 0–149)
VLDL CHOLESTEROL CAL: 26 mg/dL (ref 5–40)

## 2017-11-24 LAB — CBC
Hematocrit: 32.1 % — ABNORMAL LOW (ref 34.0–46.6)
Hemoglobin: 10.2 g/dL — ABNORMAL LOW (ref 11.1–15.9)
MCH: 26.2 pg — ABNORMAL LOW (ref 26.6–33.0)
MCHC: 31.8 g/dL (ref 31.5–35.7)
MCV: 83 fL (ref 79–97)
PLATELETS: 320 10*3/uL (ref 150–450)
RBC: 3.89 x10E6/uL (ref 3.77–5.28)
RDW: 15.2 % (ref 12.3–15.4)
WBC: 13.4 10*3/uL — AB (ref 3.4–10.8)

## 2017-11-24 MED ORDER — ATORVASTATIN CALCIUM 40 MG PO TABS: 40.0000 mg | ORAL_TABLET | Freq: Every day | ORAL | 3 refills | Status: DC

## 2017-11-25 ENCOUNTER — Encounter: Payer: Self-pay | Admitting: Family Medicine

## 2017-11-25 ENCOUNTER — Telehealth: Payer: Self-pay

## 2017-11-25 ENCOUNTER — Other Ambulatory Visit: Payer: Self-pay | Admitting: Family Medicine

## 2017-11-25 DIAGNOSIS — Z86010 Personal history of colonic polyps: Secondary | ICD-10-CM

## 2017-11-25 DIAGNOSIS — D509 Iron deficiency anemia, unspecified: Secondary | ICD-10-CM

## 2017-11-25 NOTE — Telephone Encounter (Signed)
-----   Message from Birdie Sons, MD sent at 11/25/2017 11:13 AM EDT ----- Labs show she is iron deficient. This is often due to loss of blood in GI tract and she needs referral to gi. Have referral order to Erica Rivas. She also needs to start taking iron sulfate 325mg  once a day with food.

## 2017-11-25 NOTE — Telephone Encounter (Signed)
Patient advised as below. Patient verbalizes understanding and is in agreement with treatment plan.  

## 2017-11-25 NOTE — Telephone Encounter (Signed)
-----   Message from Birdie Sons, MD sent at 11/24/2017  7:43 AM EDT ----- Please add b12, folate, and ferritin due to anemia Also, need to increase atorvastatin to 40mg  a day to get LDL under 70. Have sent new prescription to [facility]. She can take 2 x 20mg  a day until the mail order arrives.

## 2017-11-26 ENCOUNTER — Telehealth: Payer: Self-pay

## 2017-11-26 DIAGNOSIS — M519 Unspecified thoracic, thoracolumbar and lumbosacral intervertebral disc disorder: Secondary | ICD-10-CM

## 2017-11-26 MED ORDER — OXYCODONE-ACETAMINOPHEN 5-325 MG PO TABS: 1.0000 | ORAL_TABLET | Freq: Two times a day (BID) | ORAL | 0 refills | Status: DC | PRN

## 2017-11-26 NOTE — Telephone Encounter (Signed)
[facility] ([address]) pharmacy sent [facility] fax stating: patient is only getting #14/ 7 days of the Oxycodone/ Acetaminophen 5-325 due to Insurance limit because she has not had since 05/2047. Paper fax placed on your desk for review.

## 2017-12-01 LAB — SPECIMEN STATUS REPORT

## 2017-12-01 LAB — FERRITIN: Ferritin: 11 ng/mL — ABNORMAL LOW (ref 15–150)

## 2017-12-01 LAB — B12 AND FOLATE PANEL
FOLATE: 14.5 ng/mL (ref >3.0)
Vitamin B-12: >2000 pg/mL — ABNORMAL HIGH (ref 232–1245)

## 2017-12-06 ENCOUNTER — Telehealth: Payer: Self-pay | Admitting: Family Medicine

## 2017-12-06 NOTE — Telephone Encounter (Signed)
Pt stated she was returning Sarah's call. Please advise. Thanks TNP

## 2017-12-13 DIAGNOSIS — L570 Actinic keratosis: Secondary | ICD-10-CM | POA: Diagnosis not present

## 2017-12-17 ENCOUNTER — Telehealth: Payer: Self-pay

## 2017-12-17 NOTE — Telephone Encounter (Signed)
LMTCB and schedule AWV after 01/19/18. -MM

## 2017-12-19 ENCOUNTER — Other Ambulatory Visit: Payer: Self-pay | Admitting: Family Medicine

## 2017-12-19 DIAGNOSIS — I10 Essential (primary) hypertension: Secondary | ICD-10-CM

## 2018-01-04 ENCOUNTER — Encounter (INDEPENDENT_AMBULATORY_CARE_PROVIDER_SITE_OTHER): Payer: Medicare Other | Admitting: Ophthalmology

## 2018-01-04 DIAGNOSIS — H35033 Hypertensive retinopathy, bilateral: Secondary | ICD-10-CM | POA: Diagnosis not present

## 2018-01-04 DIAGNOSIS — H35073 Retinal telangiectasis, bilateral: Secondary | ICD-10-CM

## 2018-01-04 DIAGNOSIS — H43813 Vitreous degeneration, bilateral: Secondary | ICD-10-CM

## 2018-01-04 DIAGNOSIS — H35373 Puckering of macula, bilateral: Secondary | ICD-10-CM | POA: Diagnosis not present

## 2018-01-04 DIAGNOSIS — I10 Essential (primary) hypertension: Secondary | ICD-10-CM

## 2018-01-07 NOTE — Telephone Encounter (Signed)
Pt declined scheduling the AWV this year. FYI to PCP. -MM

## 2018-01-10 ENCOUNTER — Ambulatory Visit: Payer: Medicare Other | Admitting: Gastroenterology

## 2018-01-11 ENCOUNTER — Other Ambulatory Visit: Payer: Self-pay

## 2018-01-11 ENCOUNTER — Encounter: Payer: Self-pay | Admitting: Gastroenterology

## 2018-01-11 ENCOUNTER — Ambulatory Visit (INDEPENDENT_AMBULATORY_CARE_PROVIDER_SITE_OTHER): Payer: Medicare Other | Admitting: Gastroenterology

## 2018-01-11 VITALS — BP 131/60 | HR 71 | Resp 17 | Wt 161.4 lb

## 2018-01-11 DIAGNOSIS — D509 Iron deficiency anemia, unspecified: Secondary | ICD-10-CM

## 2018-01-11 DIAGNOSIS — D508 Other iron deficiency anemias: Secondary | ICD-10-CM | POA: Diagnosis not present

## 2018-01-11 NOTE — Progress Notes (Signed)
[name], MD [address]  [place], [place] [address]  Main: [phone]  Fax: [phone]    Gastroenterology Consultation  Referring Provider:     Birdie Sons, MD Primary Care Physician:  Birdie Sons, MD Primary Gastroenterologist:  Dr. [name] Reason for Consultation:     Chronic iron deficiency anemia        HPI:   Erica Rivas is a 77 y.o. female referred by Dr. Birdie Sons, MD  for consultation & management of chronic iron deficiency anemia. Patient is found to have anemia since 09/2014, hemoglobin was 9.7. Her hemoglobin has been around 10 for the last 3 years. Ferritin 11, normal folate as of 11/23/2017. She denies rectal bleeding, melena other than stools are dark on oral iron. She denies abdominal pain, nausea, vomiting, weight loss. She denies gross hematuria or bleeding per vaginal. She had a colonoscopy in 2011 found to have tubular adenoma. She is a chronic smoker, has chronic kidney disease, she does report abdominal bloating that is chronic for her and she has been gaining weight She does not have chronic liver disease She had a hysterectomy in 1985  NSAIDs: none  Antiplts/Anticoagulants/Anti thrombotics: none  GI Procedures: colonoscopy by Dr. Dionne Milo on 07/18/2009, tubular adenoma  Past Medical History:  Diagnosis Date  . Anemia   . Colon polyps   . 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 Hemorrhoids   . History of chicken pox   . History of measles   . History of mumps   . Lung nodule 02/18/2015   No additional follow up needed per CT report 03/05/2015   . 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 ABDOMINAL HYSTERECTOMY  1985   DUB; ovaries resected/ removed  . BREAST BIOPSY Right 11/12/2014   fibroadenomatous  . CARDIOVASCULAR STRESS TEST  02/2008   low risk  . DOPPLER ECHOCARDIOGRAPHY  01/2010   EF>55%, LV relaxation impairment  consistent with diastolic dysfunction, mild TR and MR  . ESOPHAGOGASTRODUODENOSCOPY  07/2009   +diffuse gastritis. Iftikhar  . GALLBLADDER SURGERY  1994  . HAND SURGERY Right 2010   surgery x 2. Dr. Sabra Heck  . NECK SURGERY  1998   Disc surgey on neck  . Sinus Surgery    . TUBAL LIGATION  1971    Current Outpatient Medications:  .  amitriptyline (ELAVIL) 25 MG tablet, Take 1 tablet (25 mg total) by mouth at bedtime., Disp: 90 tablet, Rfl: 4 .  aspirin EC 81 MG tablet, Take 81 mg by mouth daily., Disp: , Rfl:  .  atenolol (TENORMIN) 50 MG tablet, TAKE 1 TABLET BY MOUTH TWO  TIMES DAILY, Disp: 180 tablet, Rfl: 4 .  atorvastatin (LIPITOR) 40 MG tablet, Take 1 tablet (40 mg total) by mouth daily., Disp: 90 tablet, Rfl: 3 .  budesonide-formoterol (SYMBICORT) 160-4.5 MCG/ACT inhaler, INHALE ONE TO TWO PUFFS BY MOUTH TWICE DAILY FOR  COPD, Disp: 1 Inhaler, Rfl: 11 .  buPROPion (WELLBUTRIN SR) 150 MG 12 hr tablet, 1 tablet daily for 3 days, then 1 tablet twice daily. Stop smoking 14 days after starting medication, Disp: 60 tablet, Rfl: 5 .  Cholecalciferol (VITAMIN D) 2000 UNITS tablet, Take 1,000 Units by mouth daily. , Disp: , Rfl:  .  furosemide (LASIX) 20 MG tablet, Take 1 tablet (20 mg total) by mouth daily as needed (swelling). For swelling, Disp: 30 tablet, Rfl: 5 .  glipiZIDE (GLUCOTROL XL) 10 MG 24 hr tablet, TAKE 1 TABLET BY MOUTH  DAILY, Disp: 90 tablet, Rfl: 4 .  glucose blood test strip, 1 each by Other route as needed for other. One Touch Ultra Test Strips, Disp: 100 each, Rfl: 4 .  lisinopril-hydrochlorothiazide (PRINZIDE,ZESTORETIC) 20-12.5 MG tablet, TAKE 1 TABLET BY MOUTH  DAILY, Disp: 90 tablet, Rfl: 4 .  metFORMIN (GLUCOPHAGE) 1000 MG tablet, Take 1 tablet (1,000 mg total) by mouth daily., Disp: 3 tablet, Rfl: 0 .  montelukast (SINGULAIR) 10 MG tablet, Take 1 tablet (10 mg total) by mouth daily., Disp: 30 tablet, Rfl: 12 .  omeprazole (PRILOSEC) 20 MG capsule, TAKE 1 CAPSULE BY  MOUTH TWO TIMES DAILY, Disp: 180 capsule, Rfl: 4 .  ONETOUCH DELICA LANCETS 29U MISC, Use to check blood sugar daily. Type 2 diabetes E11.9, Disp: 100 each, Rfl: 3 .  oxyCODONE-acetaminophen (PERCOCET/ROXICET) 5-325 MG tablet, Take 1 tablet by mouth 2 (two) times daily as needed. For pain (FYI from pharmacy that only 14 tablets or 7 days supply were dispensed from recent prescription for 60 tablets), Disp: 14 tablet, Rfl: 0 .  PROAIR HFA 108 (90 Base) MCG/ACT inhaler, INHALE TWO PUFFS BY MOUTH EVERY 6 HOURS AS NEEDED FOR WHEEZING OR  SHORTNESS  OF  BREATH, Disp: 9 each, Rfl: 5 .  vitamin B-12 (CYANOCOBALAMIN) 1000 MCG tablet, Take 1,000 mcg by mouth daily., Disp: , Rfl:  .  valACYclovir (VALTREX) 1000 MG tablet, 2 tablets twice a day for 1 day as needed for cold sores (Patient not taking: Reported on 01/11/2018), Disp: 12 tablet, Rfl: 1    Family History  Problem Relation Age of Onset  . Hypertension Sister   . COPD Sister   . Thyroid disease Sister   . Breast cancer Sister 20  . Thyroid disease Sister   . Heart attack Mother   . Hypertension Mother   . Thyroid disease Mother        HYPERTHYROIDISM  . GI Bleed Father   . Lung cancer Brother   . Breast cancer Sister   . Hypertension Sister   . Aneurysm Sister        in the back of her neck  . Heart attack Brother        CABG     Social History   Tobacco Use  . Smoking status: Current Every Day Smoker    Packs/day: 1.00    Years: 50.00    Pack years: 50.00    Types: Cigarettes  . Smokeless tobacco: Never Used  Substance Use Topics  . Alcohol use: No    Alcohol/week: 0.0 standard drinks  . Drug use: No    Allergies as of 01/11/2018  . (No Known Allergies)    Review of Systems:    All systems reviewed and negative except where noted in HPI.   Physical Exam:  BP 131/60 (BP Location: Left Arm, Patient Position: Sitting)   Pulse 71   Resp 17   Wt 161 lb 7.2 oz (73.2 kg)   BMI 25.29 kg/m  No LMP recorded. Patient has  had a hysterectomy.  General:   Alert,  Well-developed, well-nourished, pleasant and cooperative in NAD Head:  Normocephalic and atraumatic. Eyes:  Sclera clear, no icterus.   Conjunctiva pink. Ears:  Normal auditory acuity. Nose:  No deformity, discharge, or lesions. Mouth:  No deformity or lesions,oropharynx pink & moist. Neck:  Supple; no masses or thyromegaly. Lungs:  Respirations even and unlabored.  Clear throughout to  auscultation.   No wheezes, crackles, or rhonchi. No acute distress. Heart:  Regular rate and rhythm; no murmurs, clicks, rubs, or gallops. Abdomen:  Normal bowel sounds. Soft, non-tender distended, tympanic to percussion without masses, hepatosplenomegaly or hernias noted.  No guarding or rebound tenderness.   Rectal: Not performed Msk:  Symmetrical without gross deformities. Good, equal movement & strength bilaterally. Pulses:  Normal pulses noted. Extremities:  No clubbing or edema.  No cyanosis. Neurologic:  Alert and oriented x3;  grossly normal neurologically. Skin:  Intact without significant lesions or rashes. No jaundice. Lymph Nodes:  No significant cervical adenopathy. Psych:  Alert and cooperative. Normal mood and affect.  Imaging Studies: reviewed  Assessment and Plan:   Erica Rivas is a 77 y.o. Caucasian female with chronic tobacco use, chronic kidney disease with chronic iron deficiency anemia  Chronic unexplained iron deficiency anemia: Recommend EGD and colonoscopy +/- VCE for further evaluation Continue oral iron Recommend to stop oral B12 supplements as B12 levels are supratherapeutic  Follow up in 4 weeks   [name], MD

## 2018-01-13 ENCOUNTER — Telehealth: Payer: Self-pay | Admitting: Gastroenterology

## 2018-01-13 NOTE — Telephone Encounter (Signed)
Pt left vm she states she is scheduled for colonoscopy and thought her prep was supposed to have some flavor to it please call pt

## 2018-01-13 NOTE — Telephone Encounter (Signed)
Spoke with pt and she been notified on how to use Plenvu, pt verbalized understanding

## 2018-01-14 ENCOUNTER — Encounter: Payer: Self-pay | Admitting: *Deleted

## 2018-01-14 ENCOUNTER — Other Ambulatory Visit: Payer: Self-pay | Admitting: Family Medicine

## 2018-01-18 ENCOUNTER — Ambulatory Visit: Payer: Medicare Other | Admitting: Anesthesiology

## 2018-01-18 ENCOUNTER — Encounter: Payer: Self-pay | Admitting: Anesthesiology

## 2018-01-18 ENCOUNTER — Ambulatory Visit
Admission: RE | Admit: 2018-01-18 | Discharge: 2018-01-18 | Disposition: A | Discharge status: Home / Self Care | Payer: Medicare Other | Source: Ambulatory Visit | Attending: Gastroenterology | Admitting: Gastroenterology

## 2018-01-18 ENCOUNTER — Encounter
Admission: RE | Disposition: A | Discharge status: Home / Self Care | Payer: Self-pay | Source: Ambulatory Visit | Attending: Gastroenterology

## 2018-01-18 DIAGNOSIS — K449 Diaphragmatic hernia without obstruction or gangrene: Secondary | ICD-10-CM | POA: Insufficient documentation

## 2018-01-18 DIAGNOSIS — D509 Iron deficiency anemia, unspecified: Secondary | ICD-10-CM | POA: Diagnosis not present

## 2018-01-18 DIAGNOSIS — Z85828 Personal history of other malignant neoplasm of skin: Secondary | ICD-10-CM | POA: Insufficient documentation

## 2018-01-18 DIAGNOSIS — J449 Chronic obstructive pulmonary disease, unspecified: Secondary | ICD-10-CM | POA: Diagnosis not present

## 2018-01-18 DIAGNOSIS — Z7982 Long term (current) use of aspirin: Secondary | ICD-10-CM | POA: Diagnosis not present

## 2018-01-18 DIAGNOSIS — F329 Major depressive disorder, single episode, unspecified: Secondary | ICD-10-CM | POA: Diagnosis not present

## 2018-01-18 DIAGNOSIS — Z79899 Other long term (current) drug therapy: Secondary | ICD-10-CM | POA: Insufficient documentation

## 2018-01-18 DIAGNOSIS — D649 Anemia, unspecified: Secondary | ICD-10-CM | POA: Insufficient documentation

## 2018-01-18 DIAGNOSIS — K2950 Unspecified chronic gastritis without bleeding: Secondary | ICD-10-CM | POA: Diagnosis not present

## 2018-01-18 DIAGNOSIS — K644 Residual hemorrhoidal skin tags: Secondary | ICD-10-CM | POA: Diagnosis not present

## 2018-01-18 DIAGNOSIS — K2990 Gastroduodenitis, unspecified, without bleeding: Secondary | ICD-10-CM | POA: Diagnosis not present

## 2018-01-18 DIAGNOSIS — Z7984 Long term (current) use of oral hypoglycemic drugs: Secondary | ICD-10-CM | POA: Insufficient documentation

## 2018-01-18 DIAGNOSIS — F17210 Nicotine dependence, cigarettes, uncomplicated: Secondary | ICD-10-CM | POA: Insufficient documentation

## 2018-01-18 DIAGNOSIS — K2980 Duodenitis without bleeding: Secondary | ICD-10-CM | POA: Diagnosis not present

## 2018-01-18 DIAGNOSIS — E119 Type 2 diabetes mellitus without complications: Secondary | ICD-10-CM | POA: Insufficient documentation

## 2018-01-18 DIAGNOSIS — E1122 Type 2 diabetes mellitus with diabetic chronic kidney disease: Secondary | ICD-10-CM | POA: Diagnosis not present

## 2018-01-18 DIAGNOSIS — I129 Hypertensive chronic kidney disease with stage 1 through stage 4 chronic kidney disease, or unspecified chronic kidney disease: Secondary | ICD-10-CM | POA: Diagnosis not present

## 2018-01-18 HISTORY — PX: ESOPHAGOGASTRODUODENOSCOPY (EGD) WITH PROPOFOL: SHX5813

## 2018-01-18 HISTORY — PX: COLONOSCOPY WITH PROPOFOL: SHX5780

## 2018-01-18 LAB — GLUCOSE, CAPILLARY: Glucose-Capillary: 136 mg/dL — ABNORMAL HIGH (ref 70–99)

## 2018-01-18 SURGERY — ESOPHAGOGASTRODUODENOSCOPY (EGD) WITH PROPOFOL
Anesthesia: General

## 2018-01-18 MED ORDER — PROPOFOL 500 MG/50ML IV EMUL
INTRAVENOUS | Status: DC | PRN
  Administered 2018-01-18: 120 ug/kg/min via INTRAVENOUS

## 2018-01-18 MED ORDER — LIDOCAINE HCL (CARDIAC) PF 100 MG/5ML IV SOSY
PREFILLED_SYRINGE | INTRAVENOUS | Status: DC | PRN
  Administered 2018-01-18: 100 mg via INTRAVENOUS

## 2018-01-18 MED ORDER — PHENYLEPHRINE HCL 10 MG/ML IJ SOLN
INTRAMUSCULAR | Status: DC | PRN
  Administered 2018-01-18: 100 ug via INTRAVENOUS

## 2018-01-18 MED ORDER — PROPOFOL 500 MG/50ML IV EMUL
INTRAVENOUS | Status: AC
  Filled 2018-01-18: qty 50

## 2018-01-18 MED ORDER — SODIUM CHLORIDE 0.9 % IV SOLN
INTRAVENOUS | Status: DC
  Administered 2018-01-18: 13:00:00 via INTRAVENOUS

## 2018-01-18 MED ORDER — PROPOFOL 10 MG/ML IV BOLUS
INTRAVENOUS | Status: DC | PRN
  Administered 2018-01-18: 60 mg via INTRAVENOUS

## 2018-01-18 NOTE — H&P (Signed)
Erica Darby, MD [address]  [place]  [place], [place] [address]  Main: [phone]  Fax: [phone] Pager: [phone]  Primary Care Physician:  Birdie Sons, MD Primary Gastroenterologist:  Dr. Cephas Rivas  Pre-Procedure History & Physical: HPI:  Erica Rivas is a 77 y.o. female is here for an endoscopy and colonoscopy.   Past Medical History:  Diagnosis Date  . Anemia   . Colon polyps   . COPD (chronic obstructive pulmonary disease) ([place])   . 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 Diabetes mellitus without complication ([place])   . Hemorrhoids   . History of chicken pox   . History of measles   . History of mumps   . Lung nodule 02/18/2015   No additional follow up needed per CT report 03/05/2015   . 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 ABDOMINAL HYSTERECTOMY  1985   DUB; ovaries resected/ removed  . BREAST BIOPSY Right 11/12/2014   fibroadenomatous  . CARDIOVASCULAR STRESS TEST  02/2008   low risk  . DOPPLER ECHOCARDIOGRAPHY  01/2010   EF>55%, LV relaxation impairment consistent with diastolic dysfunction, mild TR and MR  . ESOPHAGOGASTRODUODENOSCOPY  07/2009   +diffuse gastritis. Iftikhar  . GALLBLADDER SURGERY  1994  . HAND SURGERY Right 2010   surgery x 2. Dr. Sabra Heck  . NECK SURGERY  1998   Disc surgey on neck  . Sinus Surgery    . TUBAL LIGATION  1971    Prior to Admission medications   Medication Sig Start Date End Date Taking? Authorizing Provider  aspirin EC 81 MG tablet Take 81 mg by mouth daily.   Yes [provider]  atenolol (TENORMIN) 50 MG tablet TAKE 1 TABLET BY MOUTH TWO  TIMES DAILY 12/19/17  Yes Birdie Sons, MD  budesonide-formoterol [facility]) 160-4.5 MCG/ACT inhaler Inhale 2 puffs into the lungs 2 (two) times daily. 01/15/18  Yes Birdie Sons, MD  furosemide (LASIX) 20 MG tablet Take 1 tablet (20 mg total) by mouth daily as needed  (swelling). For swelling 05/25/17  Yes Birdie Sons, MD  glipiZIDE (GLUCOTROL XL) 10 MG 24 hr tablet TAKE 1 TABLET BY MOUTH  DAILY 04/06/17  Yes Birdie Sons, MD  lisinopril-hydrochlorothiazide (PRINZIDE,ZESTORETIC) 20-12.5 MG tablet TAKE 1 TABLET BY MOUTH  DAILY 04/06/17  Yes Birdie Sons, MD  metFORMIN (GLUCOPHAGE) 1000 MG tablet Take 1 tablet (1,000 mg total) by mouth daily. 11/23/17  Yes Birdie Sons, MD  omeprazole (PRILOSEC) 20 MG capsule TAKE 1 CAPSULE BY MOUTH TWO TIMES DAILY 12/19/17  Yes Birdie Sons, MD  oxyCODONE-acetaminophen (PERCOCET/ROXICET) 5-325 MG tablet Take 1 tablet by mouth 2 (two) times daily as needed. For pain (FYI from pharmacy that only 14 tablets or 7 days supply were dispensed from recent prescription for 60 tablets) 11/26/17  Yes Birdie Sons, MD  amitriptyline (ELAVIL) 25 MG tablet Take 1 tablet (25 mg total) by mouth at bedtime. 05/25/17   Birdie Sons, MD  atorvastatin (LIPITOR) 40 MG tablet Take 1 tablet (40 mg total) by mouth daily. 11/24/17   Birdie Sons, MD  buPROPion (WELLBUTRIN SR) 150 MG 12 hr tablet 1 tablet daily for 3 days, then 1 tablet twice daily. Stop smoking 14 days after starting medication 11/23/17 03/23/18  Birdie Sons, MD  Cholecalciferol (VITAMIN D) 2000 UNITS tablet Take 1,000 Units by mouth daily.  [provider]  glucose blood test strip 1 each by Other route as needed for other. One Touch Ultra Test Strips 03/09/17   Birdie Sons, MD  montelukast (SINGULAIR) 10 MG tablet Take 1 tablet (10 mg total) by mouth daily. 03/05/17   Birdie Sons, MD  [facility] DELICA LANCETS 09U MISC Use to check blood sugar daily. Type 2 diabetes E11.9 03/05/17   Birdie Sons, MD  PROAIR HFA 108 [phone] Base) MCG/ACT inhaler INHALE TWO PUFFS BY MOUTH EVERY 6 HOURS AS NEEDED FOR WHEEZING OR  SHORTNESS  OF  BREATH 04/11/17   Birdie Sons, MD  valACYclovir (VALTREX) 1000 MG tablet 2 tablets twice a day for 1 day as needed  for cold sores Patient not taking: Reported on 01/11/2018 08/03/17   Birdie Sons, MD  vitamin B-12 (CYANOCOBALAMIN) 1000 MCG tablet Take 1,000 mcg by mouth daily.    [provider]    Allergies as of 01/11/2018  . (No Known Allergies)    Family History  Problem Relation Age of Onset  . Hypertension Sister   . COPD Sister   . Thyroid disease Sister   . Breast cancer Sister 44  . Thyroid disease Sister   . Heart attack Mother   . Hypertension Mother   . Thyroid disease Mother        HYPERTHYROIDISM  . GI Bleed Father   . Lung cancer Brother   . Breast cancer Sister   . Hypertension Sister   . Aneurysm Sister        in the back of her neck  . Heart attack Brother        CABG    Social History   Socioeconomic History  . Marital status: Married    Spouse name: Not on file  . Number of children: 2  . Years of education: Not on file  . Highest education level: Not on file  Occupational History  . Occupation: Retired    Comment: Formerly worked at [employer]. Retired in 2007  Social Needs  . Financial resource strain: Not on file  . Food insecurity:    Worry: Not on file    Inability: Not on file  . Transportation needs:    Medical: Not on file    Non-medical: Not on file  Tobacco Use  . Smoking status: Current Every Day Smoker    Packs/day: 1.00    Years: 50.00    Pack years: 50.00    Types: Cigarettes  . Smokeless tobacco: Never Used  Substance and Sexual Activity  . Alcohol use: No    Alcohol/week: 0.0 standard drinks  . Drug use: No  . Sexual activity: Not on file  Lifestyle  . Physical activity:    Days per week: Not on file    Minutes per session: Not on file  . Stress: Not on file  Relationships  . Social connections:    Talks on phone: Not on file    Gets together: Not on file    Attends religious service: Not on file    Active member of club or organization: Not on file    Attends meetings of clubs or organizations: Not on file      Relationship status: Not on file  . Intimate partner violence:    Fear of current or ex partner: Not on file    Emotionally abused: Not on file    Physically abused: Not on file    Forced sexual activity:  Not on file  Other Topics Concern  . Not on file  Social History Narrative  . Not on file    Review of Systems: See HPI, otherwise negative ROS  Physical Exam: BP 135/61   Pulse 71   Temp (!) 96 F (35.6 C) (Tympanic)   Resp 18   Ht 5\' 7"  (1.702 m)   Wt 73.5 kg   SpO2 100%   BMI 25.37 kg/m  General:   Alert,  pleasant and cooperative in NAD Head:  Normocephalic and atraumatic. Neck:  Supple; no masses or thyromegaly. Lungs:  Clear throughout to auscultation.    Heart:  Regular rate and rhythm. Abdomen:  Soft, nontender and nondistended. Normal bowel sounds, without guarding, and without rebound.   Neurologic:  Alert and  oriented x4;  grossly normal neurologically.  Impression/Plan: AUSTIN PONGRATZ is here for an endoscopy and colonoscopy to be performed for chronic IDA  Risks, benefits, limitations, and alternatives regarding  endoscopy and colonoscopy have been reviewed with the patient.  Questions have been answered.  All parties agreeable.   Sherri Sear, MD  01/18/2018, 1:32 PM

## 2018-01-18 NOTE — Anesthesia Post-op Follow-up Note (Signed)
Anesthesia QCDR form completed.        

## 2018-01-18 NOTE — Transfer of Care (Signed)
Immediate Anesthesia Transfer of Care Note  Patient: Erica Rivas  Procedure(s) Performed: ESOPHAGOGASTRODUODENOSCOPY (EGD) WITH PROPOFOL (N/A ) COLONOSCOPY WITH PROPOFOL (N/A )  Patient Location: PACU  Anesthesia Type:General  Level of Consciousness: sedated and responds to stimulation  Airway & Oxygen Therapy: Patient Spontanous Breathing and Patient connected to nasal cannula oxygen  Post-op Assessment: Report given to RN and Post -op Vital signs reviewed and stable  Post vital signs: Reviewed and stable  Last Vitals:  Vitals Value Taken Time  BP 106/67 01/18/2018  2:13 PM  Temp    Pulse 79 01/18/2018  2:13 PM  Resp 23 01/18/2018  2:13 PM  SpO2 100 % 01/18/2018  2:13 PM    Last Pain:  Vitals:   01/18/18 1314  TempSrc: Tympanic         Complications: No apparent anesthesia complications

## 2018-01-18 NOTE — Op Note (Signed)
[facility] Gastroenterology Patient Name: Erica Rivas Procedure Date: 01/18/2018 1:42 PM MRN: [id number] Account #: [id number] Date of Birth: 06-27-1940 Admit Type: Outpatient Age: 77 Room: [facility] ENDO ROOM 4 Gender: Female Note Status: Finalized Procedure:            Upper GI endoscopy Indications:          Unexplained iron deficiency anemia Providers:            Lin Landsman MD, MD Referring MD:         Kirstie Peri. Caryn Section, MD (Referring MD) Medicines:            Monitored Anesthesia Care Complications:        No immediate complications. Estimated blood loss: None. Procedure:            Pre-Anesthesia Assessment:                       - Prior to the procedure, a History and Physical was                        performed, and patient medications and allergies were                        reviewed. The patient is competent. The risks and                        benefits of the procedure and the sedation options and                        risks were discussed with the patient. All questions                        were answered and informed consent was obtained.                        Patient identification and proposed procedure were                        verified by the physician, the nurse, the                        anesthesiologist, the anesthetist and the technician in                        the pre-procedure area in the procedure room in the                        endoscopy suite. Mental Status Examination: alert and                        oriented. Airway Examination: normal oropharyngeal                        airway and neck mobility. Respiratory Examination:                        clear to auscultation. CV Examination: normal.                        Prophylactic Antibiotics: The patient does not require  prophylactic antibiotics. Prior Anticoagulants: The                        patient has taken no previous anticoagulant or                antiplatelet agents. ASA Grade Assessment: III - A                        patient with severe systemic disease. After reviewing                        the risks and benefits, the patient was deemed in                        satisfactory condition to undergo the procedure. The                        anesthesia plan was to use monitored anesthesia care                        (MAC). Immediately prior to administration of                        medications, the patient was re-assessed for adequacy                        to receive sedatives. The heart rate, respiratory rate,                        oxygen saturations, blood pressure, adequacy of                        pulmonary ventilation, and response to care were                        monitored throughout the procedure. The physical status                        of the patient was re-assessed after the procedure.                       After obtaining informed consent, the endoscope was                        passed under direct vision. Throughout the procedure,                        the patient's blood pressure, pulse, and oxygen                        saturations were monitored continuously. The Endoscope                        was introduced through the mouth, and advanced to the                        second part of duodenum. The upper GI endoscopy was                        accomplished without difficulty. The  patient tolerated                        the procedure well. Findings:      The duodenal bulb and second portion of the duodenum were normal.       Biopsies for histology were taken with a cold forceps for evaluation of       celiac disease.      The entire examined stomach was normal. Biopsies were taken with a cold       forceps for Helicobacter pylori testing.      A 1 cm hiatal hernia was present.      The gastroesophageal junction and examined esophagus were normal. Impression:           - Normal duodenal bulb  and second portion of the                        duodenum. Biopsied.                       - Normal stomach. Biopsied.                       - 1 cm hiatal hernia.                       - Normal gastroesophageal junction and esophagus. Recommendation:       - Await pathology results.                       - Proceed with colonoscopy as scheduled                       See colonoscopy report Procedure Code(s):    --- Professional ---                       [phone], Esophagogastroduodenoscopy, flexible, transoral;                        with biopsy, single or multiple Diagnosis Code(s):    --- Professional ---                       K44.9, Diaphragmatic hernia without obstruction or                        gangrene                       D50.9, Iron deficiency anemia, unspecified CPT copyright 2017 American [employer] Association. All rights reserved. The codes documented in this report are preliminary and upon coder review may  be revised to meet current compliance requirements. Dr. Ulyess Mort Lin Landsman MD, MD 01/18/2018 1:57:41 PM This report has been signed electronically. Number of Addenda: 0 Note Initiated On: 01/18/2018 1:42 PM      [facility]

## 2018-01-18 NOTE — Anesthesia Procedure Notes (Signed)
Performed by: Armstead Heiland, CRNA Pre-anesthesia Checklist: Patient identified, Emergency Drugs available, Suction available, Patient being monitored and Timeout performed Patient Re-evaluated:Patient Re-evaluated prior to induction Oxygen Delivery Method: Nasal cannula Induction Type: IV induction       

## 2018-01-18 NOTE — Anesthesia Preprocedure Evaluation (Signed)
Anesthesia Evaluation  Patient identified by MRN, date of birth, ID band Patient awake    Reviewed: Allergy & Precautions, NPO status , Patient's Chart, lab work & pertinent test results, reviewed documented beta blocker date and time   Airway Mallampati: II  TM Distance: >3 FB     Dental  (+) Upper Dentures, Lower Dentures   Pulmonary COPD, Current Smoker,           Cardiovascular hypertension, Pt. on medications and Pt. on home beta blockers + CAD and + Peripheral Vascular Disease       Neuro/Psych PSYCHIATRIC DISORDERS Anxiety Depression    GI/Hepatic GERD  ,  Endo/Other  diabetes, Type 2  Renal/GU Renal disease     Musculoskeletal   Abdominal   Peds  Hematology  (+) anemia ,   Anesthesia Other Findings   Reproductive/Obstetrics                             Anesthesia Physical Anesthesia Plan  ASA: III  Anesthesia Plan: General   Post-op Pain Management:    Induction: Intravenous  PONV Risk Score and Plan:   Airway Management Planned:   Additional Equipment:   Intra-op Plan:   Post-operative Plan:   Informed Consent: I have reviewed the patients History and Physical, chart, labs and discussed the procedure including the risks, benefits and alternatives for the proposed anesthesia with the patient or authorized representative who has indicated his/her understanding and acceptance.     Plan Discussed with: CRNA  Anesthesia Plan Comments:         Anesthesia Quick Evaluation

## 2018-01-18 NOTE — Op Note (Signed)
[facility] Gastroenterology Patient Name: Erica Rivas Procedure Date: 01/18/2018 1:42 PM MRN: [id number] Account #: [id number] Date of Birth: 1940-08-05 Admit Type: Outpatient Age: 77 Room: [facility] ENDO ROOM 4 Gender: Female Note Status: Finalized Procedure:            Colonoscopy Indications:          Unexplained iron deficiency anemia Providers:            Lin Landsman MD, MD Referring MD:         Kirstie Peri. Caryn Section, MD (Referring MD) Medicines:            Monitored Anesthesia Care Complications:        No immediate complications. Estimated blood loss: None. Procedure:            Pre-Anesthesia Assessment:                       - Prior to the procedure, a History and Physical was                        performed, and patient medications and allergies were                        reviewed. The patient is competent. The risks and                        benefits of the procedure and the sedation options and                        risks were discussed with the patient. All questions                        were answered and informed consent was obtained.                        Patient identification and proposed procedure were                        verified by the physician, the nurse, the                        anesthesiologist, the anesthetist and the technician in                        the pre-procedure area in the procedure room in the                        endoscopy suite. Mental Status Examination: alert and                        oriented. Airway Examination: normal oropharyngeal                        airway and neck mobility. Respiratory Examination:                        clear to auscultation. CV Examination: normal.                        Prophylactic Antibiotics: The patient does not require  prophylactic antibiotics. Prior Anticoagulants: The                        patient has taken no previous anticoagulant or         antiplatelet agents. ASA Grade Assessment: III - A                        patient with severe systemic disease. After reviewing                        the risks and benefits, the patient was deemed in                        satisfactory condition to undergo the procedure. The                        anesthesia plan was to use monitored anesthesia care                        (MAC). Immediately prior to administration of                        medications, the patient was re-assessed for adequacy                        to receive sedatives. The heart rate, respiratory rate,                        oxygen saturations, blood pressure, adequacy of                        pulmonary ventilation, and response to care were                        monitored throughout the procedure. The physical status                        of the patient was re-assessed after the procedure.                       After obtaining informed consent, the colonoscope was                        passed under direct vision. Throughout the procedure,                        the patient's blood pressure, pulse, and oxygen                        saturations were monitored continuously. The                        Colonoscope was introduced through the anus and                        advanced to the 20 cm into the ileum. The colonoscopy                        was performed without difficulty. The patient tolerated  the procedure well. The quality of the bowel                        preparation was evaluated using the BBPS [facility] Bowel                        Preparation Scale) with scores of: Right Colon = 3,                        Transverse Colon = 3 and Left Colon = 3 (entire mucosa                        seen well with no residual staining, small fragments of                        stool or opaque liquid). The total BBPS score equals 9. Findings:      The perianal and digital rectal examinations were  normal. Pertinent       negatives include normal sphincter tone and no palpable rectal lesions.      The terminal ileum appeared normal.      The entire examined colon appeared normal.      Non-bleeding external hemorrhoids were found during retroflexion. The       hemorrhoids were medium-sized. Impression:           - The examined portion of the ileum was normal.                       - The entire examined colon is normal.                       - Non-bleeding external hemorrhoids.                       - No specimens collected. Recommendation:       - Discharge patient to home (with spouse).                       - Resume previous diet today.                       - Continue present medications.                       - Restart oral iron                       - VCE if pathology results are negative Procedure Code(s):    --- Professional ---                       [phone], Colonoscopy, flexible; diagnostic, including                        collection of specimen(s) by brushing or washing, when                        performed (separate procedure) Diagnosis Code(s):    --- Professional ---                       K64.4, Residual hemorrhoidal skin tags  D50.9, Iron deficiency anemia, unspecified CPT copyright 2017 American Medical Association. All rights reserved. The codes documented in this report are preliminary and upon coder review may  be revised to meet current compliance requirements. Dr. Ulyess Mort Lin Landsman MD, MD 01/18/2018 2:11:40 PM This report has been signed electronically. Number of Addenda: 0 Note Initiated On: 01/18/2018 1:42 PM Scope Withdrawal Time: 0 hours 6 minutes 34 seconds  Total Procedure Duration: 0 hours 9 minutes 11 seconds       [facility]

## 2018-01-18 NOTE — Anesthesia Postprocedure Evaluation (Signed)
Anesthesia Post Note  Patient: Erica Rivas  Procedure(s) Performed: ESOPHAGOGASTRODUODENOSCOPY (EGD) WITH PROPOFOL (N/A ) COLONOSCOPY WITH PROPOFOL (N/A )  Patient location during evaluation: Endoscopy Anesthesia Type: General Level of consciousness: awake and alert Pain management: pain level controlled Vital Signs Assessment: post-procedure vital signs reviewed and stable Respiratory status: spontaneous breathing, nonlabored ventilation, respiratory function stable and patient connected to nasal cannula oxygen Cardiovascular status: blood pressure returned to baseline and stable Postop Assessment: no apparent nausea or vomiting Anesthetic complications: no     Last Vitals:  Vitals:   01/18/18 1440 01/18/18 1443  BP: 133/68 139/61  Pulse:    Resp: 17 (!) 23  Temp:    SpO2: 91% 100%    Last Pain:  Vitals:   01/18/18 1440  TempSrc:   PainSc: 0-No pain                 Wah Sabic S

## 2018-01-20 ENCOUNTER — Encounter: Payer: Self-pay | Admitting: Gastroenterology

## 2018-01-21 ENCOUNTER — Encounter: Payer: Self-pay | Admitting: Family Medicine

## 2018-01-21 DIAGNOSIS — K2980 Duodenitis without bleeding: Secondary | ICD-10-CM | POA: Insufficient documentation

## 2018-01-21 LAB — SURGICAL PATHOLOGY

## 2018-02-23 ENCOUNTER — Ambulatory Visit: Payer: Medicare Other

## 2018-03-01 ENCOUNTER — Ambulatory Visit: Payer: Medicare Other | Admitting: Gastroenterology

## 2018-03-12 ENCOUNTER — Ambulatory Visit (INDEPENDENT_AMBULATORY_CARE_PROVIDER_SITE_OTHER): Payer: Medicare Other

## 2018-03-12 DIAGNOSIS — Z23 Encounter for immunization: Secondary | ICD-10-CM | POA: Diagnosis not present

## 2018-04-15 ENCOUNTER — Encounter: Payer: Self-pay | Admitting: Family Medicine

## 2018-04-15 ENCOUNTER — Ambulatory Visit (INDEPENDENT_AMBULATORY_CARE_PROVIDER_SITE_OTHER): Payer: Medicare Other | Admitting: Family Medicine

## 2018-04-15 VITALS — BP 142/62 | HR 68 | Temp 98.3°F | Resp 16 | Ht 67.0 in | Wt 159.0 lb

## 2018-04-15 DIAGNOSIS — J441 Chronic obstructive pulmonary disease with (acute) exacerbation: Secondary | ICD-10-CM | POA: Diagnosis not present

## 2018-04-15 MED ORDER — DOXYCYCLINE HYCLATE 100 MG PO TABS: 100.0000 mg | ORAL_TABLET | Freq: Two times a day (BID) | ORAL | 0 refills | Status: DC

## 2018-04-15 MED ORDER — PREDNISONE 20 MG PO TABS: ORAL_TABLET | ORAL | 0 refills | Status: DC

## 2018-04-15 NOTE — Progress Notes (Signed)
  Subjective:     Patient ID: Erica Rivas, female   DOB: [birth year], 77 y.o.   MRN: [id number] Chief Complaint  Patient presents with  . Cough    Patient comes in today c/o cough and congestion X 3 days. She also mentions that she wheezes occasionally. Denies fever. She reports that her cough is productive. She also mentions "coughing spells" that are worse at night.    HPI Reports increased thick purulent sputum and shortness of breath. Has been using her rescue inhaler regularly (used prior ot office visit). Has decreased smoking while ill.  Review of Systems     Objective:   Physical Exam  Constitutional: She appears well-developed and well-nourished. No distress.  Ears: T.M's intact without inflammation Throat: no tonsillar enlargement or exudate Neck: no cervical adenopathy Lungs: diminished breath sounds o/w clear     Assessment:    1. COPD exacerbation (HCC) - doxycycline (VIBRA-TABS) 100 MG tablet; Take 1 tablet (100 mg total) by mouth 2 (two) times daily.  Dispense: 14 tablet; Refill: 0 - predniSONE (DELTASONE) 20 MG tablet; One pill twice daily for 5 days  Dispense: 10 tablet; Refill: 0    Plan:    Continue Mucinex DM (samples provided).

## 2018-04-15 NOTE — Patient Instructions (Signed)
Continue Mucinex DM.

## 2018-05-02 ENCOUNTER — Other Ambulatory Visit: Payer: Self-pay | Admitting: Family Medicine

## 2018-05-02 DIAGNOSIS — I10 Essential (primary) hypertension: Secondary | ICD-10-CM

## 2018-05-02 MED ORDER — FUROSEMIDE 20 MG PO TABS: 20.0000 mg | ORAL_TABLET | Freq: Every day | ORAL | 4 refills | Status: DC | PRN

## 2018-05-02 NOTE — Telephone Encounter (Signed)
Patient called stating she needs some Furosemide 20 mg. pills.  She only wants 10 or so because they expire before she uses them all.   [place]

## 2018-05-24 ENCOUNTER — Ambulatory Visit (INDEPENDENT_AMBULATORY_CARE_PROVIDER_SITE_OTHER): Payer: Medicare Other | Admitting: Family Medicine

## 2018-05-24 ENCOUNTER — Encounter: Payer: Self-pay | Admitting: Family Medicine

## 2018-05-24 VITALS — BP 165/85 | HR 66 | Temp 98.7°F | Resp 16 | Wt 164.0 lb

## 2018-05-24 DIAGNOSIS — F419 Anxiety disorder, unspecified: Secondary | ICD-10-CM

## 2018-05-24 DIAGNOSIS — E559 Vitamin D deficiency, unspecified: Secondary | ICD-10-CM | POA: Diagnosis not present

## 2018-05-24 DIAGNOSIS — E7800 Pure hypercholesterolemia, unspecified: Secondary | ICD-10-CM | POA: Diagnosis not present

## 2018-05-24 DIAGNOSIS — J441 Chronic obstructive pulmonary disease with (acute) exacerbation: Secondary | ICD-10-CM

## 2018-05-24 DIAGNOSIS — D519 Vitamin B12 deficiency anemia, unspecified: Secondary | ICD-10-CM

## 2018-05-24 DIAGNOSIS — F331 Major depressive disorder, recurrent, moderate: Secondary | ICD-10-CM

## 2018-05-24 DIAGNOSIS — I2510 Atherosclerotic heart disease of native coronary artery without angina pectoris: Secondary | ICD-10-CM | POA: Diagnosis not present

## 2018-05-24 DIAGNOSIS — E1140 Type 2 diabetes mellitus with diabetic neuropathy, unspecified: Secondary | ICD-10-CM

## 2018-05-24 DIAGNOSIS — I10 Essential (primary) hypertension: Secondary | ICD-10-CM

## 2018-05-24 MED ORDER — ALBUTEROL SULFATE HFA 108 (90 BASE) MCG/ACT IN AERS: INHALATION_SPRAY | RESPIRATORY_TRACT | 5 refills | Status: DC

## 2018-05-24 MED ORDER — LEVOFLOXACIN 500 MG PO TABS: 500.0000 mg | ORAL_TABLET | Freq: Every day | ORAL | 0 refills | Status: DC

## 2018-05-24 MED ORDER — PREDNISONE 10 MG PO TABS: ORAL_TABLET | ORAL | 0 refills | Status: AC

## 2018-05-24 MED ORDER — LISINOPRIL-HYDROCHLOROTHIAZIDE 20-12.5 MG PO TABS: 2.0000 | ORAL_TABLET | Freq: Every day | ORAL | 0 refills | Status: DC

## 2018-05-24 MED ORDER — ESCITALOPRAM OXALATE 5 MG PO TABS: 5.0000 mg | ORAL_TABLET | Freq: Every day | ORAL | 1 refills | Status: DC

## 2018-05-24 NOTE — Patient Instructions (Signed)
.   Please bring all of your medications to every appointment so we can make sure that our medication list is the same as yours.    Start taking 2 tablets of lisinopril-hctz 20-12.5 every day

## 2018-05-24 NOTE — Progress Notes (Signed)
Patient: Erica Rivas Female    DOB: 09-20-1940   78 y.o.   MRN: [id number] Visit Date: 05/24/2018  Today's Provider: Lelon Huh, MD   Chief Complaint  Patient presents with  . Hyperlipidemia  . Diabetes  . Back Pain   Subjective:     HPI  Lipid/Cholesterol, Follow-up:   Last seen for this 5 months ago.  Management changes since that visit include starting Atorvastatin 40mg  daily. . Last Lipid Panel:    Component Value Date/Time   CHOL 159 11/23/2017 1228   TRIG 132 11/23/2017 1228   HDL 53 11/23/2017 1228   CHOLHDL 3.0 11/23/2017 1228   [place] 80 11/23/2017 1228    Risk factors for vascular disease include diabetes mellitus and hypercholesterolemia  She reports good compliance with treatment. She is not having side effects.  Current symptoms include none and have been stable. Weight trend: fluctuating a bit Prior visit with dietician: no Current diet: in general, a "healthy" diet   Current exercise: none  Wt Readings from Last 3 Encounters:  05/24/18 164 lb (74.4 kg)  04/15/18 159 lb (72.1 kg)  01/18/18 162 lb (73.5 kg)    -------------------------------------------------------------------  Diabetes Mellitus Type II, Follow-up:   Lab Results  Component Value Date   HGBA1C 7.5 (A) 11/23/2017   HGBA1C 6.7 05/25/2017   HGBA1C 6.5 11/16/2016    Last seen for diabetes 5 months ago.  Management since then includes advising patient to cut back on starchy foods. She reports good compliance with treatment. She is not having side effects.  Current symptoms include none and have been stable. Home blood sugar records: fasting range: 120's  Episodes of hypoglycemia? no   Current Insulin Regimen: none Most Recent Eye Exam: 1 year ago Weight trend: fluctuating a bit Prior visit with dietician: no Current diet: in general, a "healthy" diet   Current exercise: none  Pertinent Labs:    Component Value Date/Time   CHOL 159 11/23/2017 1228   TRIG 132 11/23/2017 1228   HDL 53 11/23/2017 1228   LDLCALC 80 11/23/2017 1228   CREATININE 1.47 (H) 11/23/2017 1228    Wt Readings from Last 3 Encounters:  05/24/18 164 lb (74.4 kg)  04/15/18 159 lb (72.1 kg)  01/18/18 162 lb (73.5 kg)    ------------------------------------------------------------------------ Atherosclerosis: Patient was last seen for this problem 5 months ago and no changes were made. Patient was advised to continue current medications.   Disc disorder of lumbar region: Patient was last seen for this problem 5 months ago and no changes were made. Patient reports her pain is stable of current medication. States oxycodone/apap remains effective, takes a few days a month, and is not having any adverse effect from medication.   Complains of cold sx since Thanksgiving, seen Mikki Santee and given prescription doxycycline and prednisone and improved, but sx have since flared back up. Feels a little short of breath, cough productive yellow and green mucous. Slowly getting worse. No fevers or chills.   She also states she has had much more stress on her lately, making her feel anxious and depressed. Her daughter told her to ask about a prescription for alprazolam or lorazepam. She has taken paroxetine, Wellbutrin, and citalopram in the past and doesn't recall any adverse effects. She is not having any trouble sleeping.     No Known Allergies   Current Outpatient Medications:  .  amitriptyline (ELAVIL) 25 MG tablet, Take 1 tablet (25 mg total) by mouth  at bedtime., Disp: 90 tablet, Rfl: 4 .  aspirin EC 81 MG tablet, Take 81 mg by mouth daily., Disp: , Rfl:  .  atenolol (TENORMIN) 50 MG tablet, TAKE 1 TABLET BY MOUTH TWO  TIMES DAILY, Disp: 180 tablet, Rfl: 4 .  atorvastatin (LIPITOR) 40 MG tablet, Take 1 tablet (40 mg total) by mouth daily., Disp: 90 tablet, Rfl: 3 .  budesonide-formoterol (SYMBICORT) 160-4.5 MCG/ACT inhaler, Inhale 2 puffs into the lungs 2 (two) times daily.,  Disp: 1 Inhaler, Rfl: 11 .  Cholecalciferol (VITAMIN D) 2000 UNITS tablet, Take 1,000 Units by mouth daily. , Disp: , Rfl:  .  ferrous sulfate 325 (65 FE) MG tablet, Take 325 mg by mouth daily with breakfast., Disp: , Rfl:  .  furosemide (LASIX) 20 MG tablet, Take 1 tablet (20 mg total) by mouth daily as needed (swelling)., Disp: 10 tablet, Rfl: 4 .  glipiZIDE (GLUCOTROL XL) 10 MG 24 hr tablet, TAKE 1 TABLET BY MOUTH  DAILY, Disp: 90 tablet, Rfl: 4 .  glucose blood test strip, 1 each by Other route as needed for other. One Touch Ultra Test Strips, Disp: 100 each, Rfl: 4 .  lisinopril-hydrochlorothiazide (PRINZIDE,ZESTORETIC) 20-12.5 MG tablet, TAKE 1 TABLET BY MOUTH  DAILY, Disp: 90 tablet, Rfl: 4 .  metFORMIN (GLUCOPHAGE) 1000 MG tablet, Take 1 tablet (1,000 mg total) by mouth daily., Disp: 3 tablet, Rfl: 0 .  montelukast (SINGULAIR) 10 MG tablet, TAKE 1 TABLET BY MOUTH ONCE DAILY, Disp: 30 tablet, Rfl: 1 .  omeprazole (PRILOSEC) 20 MG capsule, TAKE 1 CAPSULE BY MOUTH TWO TIMES DAILY, Disp: 180 capsule, Rfl: 4 .  ONETOUCH DELICA LANCETS 92J MISC, Use to check blood sugar daily. Type 2 diabetes E11.9, Disp: 100 each, Rfl: 3 .  oxyCODONE-acetaminophen (PERCOCET/ROXICET) 5-325 MG tablet, Take 1 tablet by mouth 2 (two) times daily as needed. For pain (FYI from pharmacy that only 14 tablets or 7 days supply were dispensed from recent prescription for 60 tablets), Disp: 14 tablet, Rfl: 0 .  PROAIR HFA 108 (90 Base) MCG/ACT inhaler, INHALE TWO PUFFS BY MOUTH EVERY 6 HOURS AS NEEDED FOR WHEEZING OR  SHORTNESS  OF  BREATH, Disp: 9 each, Rfl: 5 .  valACYclovir (VALTREX) 1000 MG tablet, 2 tablets twice a day for 1 day as needed for cold sores (Patient not taking: Reported on 01/11/2018), Disp: 12 tablet, Rfl: 1 .  vitamin B-12 (CYANOCOBALAMIN) 1000 MCG tablet, Take 1,000 mcg by mouth daily., Disp: , Rfl:    Review of Systems  Constitutional: Negative for appetite change, chills, fatigue and fever.    Respiratory: Positive for cough (productive with yellow sputum) and shortness of breath. Negative for chest tightness.   Cardiovascular: Negative for chest pain and palpitations.  Gastrointestinal: Negative for abdominal pain, nausea and vomiting.  Neurological: Negative for dizziness and weakness.    Social History   Tobacco Use  . Smoking status: Current Every Day Smoker    Packs/day: 0.50    Years: 50.00    Pack years: 25.00    Types: Cigarettes  . Smokeless tobacco: Never Used  Substance Use Topics  . Alcohol use: No    Alcohol/week: 0.0 standard drinks      Objective:   BP (!) 165/85 (BP Location: Left Arm, Cuff Size: Normal)   Pulse 66   Temp 98.7 F (37.1 C) (Oral)   Resp 16   Wt 164 lb (74.4 kg)   SpO2 97% Comment: room air  BMI 25.69 kg/m  Vitals:  05/24/18 1122 05/24/18 1128  BP: (!) 178/84 (!) 165/85  Pulse: 66   Resp: 16   Temp: 98.7 F (37.1 C)   TempSrc: Oral   SpO2: 97%   Weight: 164 lb (74.4 kg)      Physical Exam   General Appearance:    Alert, cooperative, no distress  Eyes:    PERRL, conjunctiva/corneas clear, EOM's intact       Lungs:      Occasional expiratory wheeze, no rales,  respirations unlabored  Heart:    Regular rate and rhythm  Neurologic:   Awake, alert, oriented x 3. No apparent focal neurological           defect.          Assessment & Plan    1. Essential hypertension Uncontrolled, she had - lisinopril-hydrochlorothiazide (PRINZIDE,ZESTORETIC) 20-12.5 MG tablet filled about 3 weeks ago, will double dose to take 2 tablets by mouth daily.   Recheck BP in 3-4 weeks.   2. Atherosclerosis of native coronary artery of native heart without angina pectoris Asymptomatic. Compliant with medication.  Continue aggressive risk factor modification.    3. . Pure hypercholesterolemia She is tolerating initiation of atorvastatin well with no adverse effects.   - Comprehensive metabolic panel - Lipid panel  4. Vitamin D  deficiency  - VITAMIN D 25 Hydroxy (Vit-D Deficiency, Fractures)  5. Type 2 diabetes mellitus with diabetic neuropathy, without long-term current use of insulin (HCC) Doing well with current medication and home sugars well controlled.  - Hemoglobin A1c  6. COPD exacerbation (HCC)  - predniSONE (DELTASONE) 10 MG tablet; 6 tablets for 1 day, then 5 for 1 day, then 4 for 1 day, then 3 for 1 day, then 2 for 1 day then 1 for 1 day.  Dispense: 21 tablet; Refill: 0 - levofloxacin (LEVAQUIN) 500 MG tablet; Take 1 tablet (500 mg total) by mouth daily.  Dispense: 7 tablet; Refill: 0 - albuterol (PROAIR HFA) 108 (90 Base) MCG/ACT inhaler; INHALE TWO PUFFS BY MOUTH EVERY 6 HOURS AS NEEDED FOR WHEEZING OR  SHORTNESS  OF  BREATH  Dispense: 9 each; Refill: 5  7. Situation stress & depression.  Try escitalopram 5mg  daily, anticipate titrate to 10mg  if not much better at follow up next month.   Addressed extensive list of chronic and acute medical problems today requiring extensive time in counseling and coordination of care.  Over half of this 45 minute visit were spent in counseling and coordinating care of multiple medical problems.     Lelon Huh, MD  [facility]

## 2018-05-25 DIAGNOSIS — E1140 Type 2 diabetes mellitus with diabetic neuropathy, unspecified: Secondary | ICD-10-CM | POA: Diagnosis not present

## 2018-05-25 DIAGNOSIS — E7800 Pure hypercholesterolemia, unspecified: Secondary | ICD-10-CM | POA: Diagnosis not present

## 2018-05-26 ENCOUNTER — Telehealth: Payer: Self-pay

## 2018-05-26 LAB — COMPREHENSIVE METABOLIC PANEL
ALBUMIN: 3.8 g/dL (ref 3.5–4.8)
ALT: 14 IU/L (ref 0–32)
AST: 16 IU/L (ref 0–40)
Albumin/Globulin Ratio: 1.7 (ref 1.2–2.2)
Alkaline Phosphatase: 104 IU/L (ref 39–117)
BUN / CREAT RATIO: 7 — AB (ref 12–28)
BUN: 8 mg/dL (ref 8–27)
Bilirubin Total: 0.2 mg/dL (ref 0.0–1.2)
CO2: 26 mmol/L (ref 20–29)
CREATININE: 1.15 mg/dL — AB (ref 0.57–1.00)
Calcium: 9.6 mg/dL (ref 8.7–10.3)
Chloride: 98 mmol/L (ref 96–106)
GFR calc non Af Amer: 47 mL/min/{1.73_m2} — ABNORMAL LOW (ref >59)
GFR, EST AFRICAN AMERICAN: 54 mL/min/{1.73_m2} — AB (ref >59)
GLOBULIN, TOTAL: 2.3 g/dL (ref 1.5–4.5)
Glucose: 108 mg/dL — ABNORMAL HIGH (ref 65–99)
Potassium: 4.7 mmol/L (ref 3.5–5.2)
SODIUM: 141 mmol/L (ref 134–144)
Total Protein: 6.1 g/dL (ref 6.0–8.5)

## 2018-05-26 LAB — LIPID PANEL
CHOL/HDL RATIO: 3.1 ratio (ref 0.0–4.4)
CHOLESTEROL TOTAL: 135 mg/dL (ref 100–199)
HDL: 44 mg/dL (ref >39)
LDL CALC: 69 mg/dL (ref 0–99)
Triglycerides: 111 mg/dL (ref 0–149)
VLDL Cholesterol Cal: 22 mg/dL (ref 5–40)

## 2018-05-26 LAB — HEMOGLOBIN A1C
ESTIMATED AVERAGE GLUCOSE: 157 mg/dL
HEMOGLOBIN A1C: 7.1 % — AB (ref 4.8–5.6)

## 2018-05-26 LAB — VITAMIN D 25 HYDROXY (VIT D DEFICIENCY, FRACTURES): Vit D, 25-Hydroxy: 31.8 ng/mL (ref 30.0–100.0)

## 2018-05-26 NOTE — Telephone Encounter (Signed)
Pt advised.   Thanks,   -Laura  

## 2018-05-26 NOTE — Telephone Encounter (Signed)
-----   Message from Birdie Sons, MD sent at 05/26/2018  8:00 AM EST ----- Cholesterol is good at 135. a1c is stable at 7.1. rest of labs normal. Follow up for BP check on 06/28/2018 as scheduled.

## 2018-06-28 ENCOUNTER — Ambulatory Visit: Payer: Medicare Other | Admitting: Family Medicine

## 2018-06-30 ENCOUNTER — Other Ambulatory Visit: Payer: Self-pay | Admitting: Family Medicine

## 2018-06-30 DIAGNOSIS — Z1231 Encounter for screening mammogram for malignant neoplasm of breast: Secondary | ICD-10-CM

## 2018-07-05 ENCOUNTER — Ambulatory Visit (INDEPENDENT_AMBULATORY_CARE_PROVIDER_SITE_OTHER): Payer: Medicare Other | Admitting: Family Medicine

## 2018-07-05 ENCOUNTER — Encounter: Payer: Self-pay | Admitting: Family Medicine

## 2018-07-05 ENCOUNTER — Other Ambulatory Visit: Payer: Self-pay | Admitting: Family Medicine

## 2018-07-05 VITALS — BP 144/80 | HR 65 | Temp 98.4°F | Wt 165.0 lb

## 2018-07-05 DIAGNOSIS — I10 Essential (primary) hypertension: Secondary | ICD-10-CM

## 2018-07-05 DIAGNOSIS — F331 Major depressive disorder, recurrent, moderate: Secondary | ICD-10-CM | POA: Diagnosis not present

## 2018-07-05 MED ORDER — MONTELUKAST SODIUM 10 MG PO TABS: 10.0000 mg | ORAL_TABLET | Freq: Every day | ORAL | 4 refills | Status: DC

## 2018-07-05 MED ORDER — ESCITALOPRAM OXALATE 5 MG PO TABS: 5.0000 mg | ORAL_TABLET | Freq: Every day | ORAL | 3 refills | Status: DC

## 2018-07-05 MED ORDER — GLUCOSE BLOOD VI STRP: ORAL_STRIP | 3 refills | Status: DC

## 2018-07-05 MED ORDER — LISINOPRIL-HYDROCHLOROTHIAZIDE 20-12.5 MG PO TABS: 2.0000 | ORAL_TABLET | Freq: Every day | ORAL | 3 refills | Status: DC

## 2018-07-05 MED ORDER — LISINOPRIL-HYDROCHLOROTHIAZIDE 20-12.5 MG PO TABS: 2.0000 | ORAL_TABLET | Freq: Every day | ORAL | 0 refills | Status: DC

## 2018-07-05 NOTE — Telephone Encounter (Signed)
Pt called saying she is out of the Lisinopril and that we have sent it to her [facility] order but she needs enough to hold her over sent to [facility]  CB#  [phone]  Thanks  Con Memos

## 2018-07-05 NOTE — Patient Instructions (Signed)
.   Please review the attached list of medications and notify my office if there are any errors.   . Please bring all of your medications to every appointment so we can make sure that our medication list is the same as yours.   

## 2018-07-05 NOTE — Progress Notes (Signed)
Patient: Erica Rivas Female    DOB: [birth year]   78 y.o.   MRN: [id number] Visit Date: 07/05/2018  Today's Provider: Lelon Huh, MD   Chief Complaint  Patient presents with  . Hypertension  . Depression   Subjective:     Depression         Pt started Lexapro 5mg  a day on 05/24/2018) .  The problem has been gradually improving (Pt reports about an 80% improvement.) since onset.  Associated symptoms include decreased concentration, fatigue and decreased interest.  Associated symptoms include no helplessness, no hopelessness, does not have insomnia, no appetite change, no headaches, no indigestion, not sad and no suicidal ideas.  Past treatments include SSRIs - Selective serotonin reuptake inhibitors.  Compliance with treatment is good.  Previous treatment provided moderate relief.     Hypertension, follow-up:  BP Readings from Last 3 Encounters:  07/05/18 (!) 161/76  05/24/18 (!) 165/85  04/15/18 (!) 142/62    She was last seen for hypertension 6 weeks ago.  BP at that visit was 165/85. Management since that visit includes Increase Lisinopril/HCTZ 20/12.5mg  to 2 tablets a day. She reports excellent compliance with treatment. She is not having side effects.  She is exercising. She is not adherent to low salt diet.   Outside blood pressures are around 130-150's/60-70's. She is experiencing fatigue.  Patient denies chest pain, exertional chest pressure/discomfort and lower extremity edema.   Cardiovascular risk factors include advanced age (older than 72 for men, 17 for women), diabetes mellitus, dyslipidemia and hypertension.  Use of agents associated with hypertension: none.     Weight trend: stable Wt Readings from Last 3 Encounters:  07/05/18 165 lb (74.8 kg)  05/24/18 164 lb (74.4 kg)  04/15/18 159 lb (72.1 kg)    Current diet: in general, a "healthy" diet    ------------------------------------------------------------------------    No Known  Allergies   Current Outpatient Medications:  .  albuterol (PROAIR HFA) 108 (90 Base) MCG/ACT inhaler, INHALE TWO PUFFS BY MOUTH EVERY 6 HOURS AS NEEDED FOR WHEEZING OR  SHORTNESS  OF  BREATH, Disp: 9 each, Rfl: 5 .  amitriptyline (ELAVIL) 25 MG tablet, Take 1 tablet (25 mg total) by mouth at bedtime., Disp: 90 tablet, Rfl: 4 .  aspirin EC 81 MG tablet, Take 81 mg by mouth daily., Disp: , Rfl:  .  atenolol (TENORMIN) 50 MG tablet, TAKE 1 TABLET BY MOUTH TWO  TIMES DAILY, Disp: 180 tablet, Rfl: 4 .  atorvastatin (LIPITOR) 40 MG tablet, Take 1 tablet (40 mg total) by mouth daily., Disp: 90 tablet, Rfl: 3 .  budesonide-formoterol (SYMBICORT) 160-4.5 MCG/ACT inhaler, Inhale 2 puffs into the lungs 2 (two) times daily., Disp: 1 Inhaler, Rfl: 11 .  Cholecalciferol (VITAMIN D) 2000 UNITS tablet, Take 1,000 Units by mouth daily. , Disp: , Rfl:  .  escitalopram (LEXAPRO) 5 MG tablet, Take 1 tablet (5 mg total) by mouth daily., Disp: 30 tablet, Rfl: 1 .  ferrous sulfate 325 (65 FE) MG tablet, Take 325 mg by mouth daily with breakfast., Disp: , Rfl:  .  furosemide (LASIX) 20 MG tablet, Take 1 tablet (20 mg total) by mouth daily as needed (swelling)., Disp: 10 tablet, Rfl: 4 .  glipiZIDE (GLUCOTROL XL) 10 MG 24 hr tablet, TAKE 1 TABLET BY MOUTH  DAILY, Disp: 90 tablet, Rfl: 4 .  glucose blood test strip, 1 each by Other route as needed for other. One Touch Ultra Test Strips, Disp:  100 each, Rfl: 4 .  lisinopril-hydrochlorothiazide (PRINZIDE,ZESTORETIC) 20-12.5 MG tablet, Take 2 tablets by mouth daily., Disp: 3 tablet, Rfl: 0 .  metFORMIN (GLUCOPHAGE) 1000 MG tablet, Take 1 tablet (1,000 mg total) by mouth daily., Disp: 3 tablet, Rfl: 0 .  montelukast (SINGULAIR) 10 MG tablet, TAKE 1 TABLET BY MOUTH ONCE DAILY, Disp: 30 tablet, Rfl: 1 .  omeprazole (PRILOSEC) 20 MG capsule, TAKE 1 CAPSULE BY MOUTH TWO TIMES DAILY, Disp: 180 capsule, Rfl: 4 .  ONETOUCH DELICA LANCETS 60F MISC, Use to check blood sugar daily.  Type 2 diabetes E11.9, Disp: 100 each, Rfl: 3 .  oxyCODONE-acetaminophen (PERCOCET/ROXICET) 5-325 MG tablet, Take 1 tablet by mouth 2 (two) times daily as needed. For pain (FYI from pharmacy that only 14 tablets or 7 days supply were dispensed from recent prescription for 60 tablets), Disp: 14 tablet, Rfl: 0 .  vitamin B-12 (CYANOCOBALAMIN) 1000 MCG tablet, Take 1,000 mcg by mouth daily., Disp: , Rfl:  .  levofloxacin (LEVAQUIN) 500 MG tablet, Take 1 tablet (500 mg total) by mouth daily. (Patient not taking: Reported on 07/05/2018), Disp: 7 tablet, Rfl: 0 .  valACYclovir (VALTREX) 1000 MG tablet, 2 tablets twice a day for 1 day as needed for cold sores (Patient not taking: Reported on 01/11/2018), Disp: 12 tablet, Rfl: 1  Review of Systems  Constitutional: Positive for fatigue. Negative for activity change, appetite change, chills, diaphoresis, fever and unexpected weight change.  Respiratory: Positive for cough and shortness of breath. Negative for apnea, choking, chest tightness, wheezing and stridor.        History of COPD.  Pt states this is stable.   Cardiovascular: Negative.   Gastrointestinal: Negative.   Neurological: Negative for dizziness, light-headedness and headaches.  Psychiatric/Behavioral: Positive for decreased concentration and depression. Negative for agitation, behavioral problems, confusion, dysphoric mood, hallucinations, self-injury, sleep disturbance and suicidal ideas. The patient is nervous/anxious. The patient does not have insomnia and is not hyperactive.     Social History   Tobacco Use  . Smoking status: Current Every Day Smoker    Packs/day: 0.50    Years: 50.00    Pack years: 25.00    Types: Cigarettes  . Smokeless tobacco: Never Used  Substance Use Topics  . Alcohol use: No    Alcohol/week: 0.0 standard drinks      Objective:    Vitals:   07/05/18 1034 07/05/18 1053  BP: (!) 161/76 (!) 144/80  Pulse: 65   Temp: 98.4 F (36.9 C)   TempSrc: Oral     Weight: 165 lb (74.8 kg)     Physical Exam   General Appearance:    Alert, cooperative, no distress  Eyes:    PERRL, conjunctiva/corneas clear, EOM's intact       Lungs:     Clear to auscultation bilaterally, respirations unlabored  Heart:    Regular rate and rhythm  Neurologic:   Awake, alert, oriented x 3. No apparent focal neurological           defect.          Assessment & Plan    1. Essential hypertension Improved on increased dose of- lisinopril-hydrochlorothiazide (PRINZIDE,ZESTORETIC) 20-12.5 MG tablet; Take 2 tablets by mouth daily.  Dispense: 180 tablet; Refill: 3  2. Moderate episode of recurrent major depressive disorder ([place]) She feels considerably better since starting escitalopram. Continue current medications.  Prescription sent to her mail order pharmacy.      Lelon Huh, MD  [facility]

## 2018-07-05 NOTE — Telephone Encounter (Signed)
Already sent prescription for 15 day supply to [facility]. Didn't they get prescription?

## 2018-07-06 NOTE — Telephone Encounter (Signed)
I called pharmacy and verified that they received 15 day supply prescription. I called and advised patient of this.

## 2018-07-16 ENCOUNTER — Telehealth: Payer: Self-pay

## 2018-07-16 NOTE — Telephone Encounter (Signed)
Call pt regarding lung screening. Left message to return call.

## 2018-07-18 ENCOUNTER — Ambulatory Visit
Admission: RE | Admit: 2018-07-18 | Discharge: 2018-07-18 | Disposition: A | Discharge status: Home / Self Care | Payer: Medicare Other | Source: Ambulatory Visit | Attending: Family Medicine | Admitting: Family Medicine

## 2018-07-18 ENCOUNTER — Telehealth: Payer: Self-pay | Admitting: *Deleted

## 2018-07-18 DIAGNOSIS — Z1231 Encounter for screening mammogram for malignant neoplasm of breast: Secondary | ICD-10-CM | POA: Diagnosis not present

## 2018-07-18 DIAGNOSIS — Z122 Encounter for screening for malignant neoplasm of respiratory organs: Secondary | ICD-10-CM

## 2018-07-18 DIAGNOSIS — Z87891 Personal history of nicotine dependence: Secondary | ICD-10-CM

## 2018-07-18 NOTE — Telephone Encounter (Signed)
Patient has been notified that annual lung cancer screening low dose CT scan is due currently or will be in near future. Confirmed that patient is within the age range of 55-77, and asymptomatic, (no signs or symptoms of lung cancer). Patient denies illness that would prevent curative treatment for lung cancer if found. Verified smoking history, (current, 52 pack year). The shared decision making visit was done 07/21/16. Patient is agreeable for CT scan being scheduled.

## 2018-07-20 ENCOUNTER — Encounter: Payer: Self-pay | Admitting: *Deleted

## 2018-07-29 ENCOUNTER — Other Ambulatory Visit: Payer: Self-pay

## 2018-07-29 ENCOUNTER — Ambulatory Visit
Admission: RE | Admit: 2018-07-29 | Discharge: 2018-07-29 | Disposition: A | Discharge status: Home / Self Care | Payer: Medicare Other | Source: Ambulatory Visit | Attending: Nurse Practitioner | Admitting: Nurse Practitioner

## 2018-07-29 DIAGNOSIS — Z87891 Personal history of nicotine dependence: Secondary | ICD-10-CM

## 2018-07-29 DIAGNOSIS — Z122 Encounter for screening for malignant neoplasm of respiratory organs: Secondary | ICD-10-CM | POA: Insufficient documentation

## 2018-08-03 ENCOUNTER — Encounter: Payer: Self-pay | Admitting: *Deleted

## 2018-08-15 ENCOUNTER — Telehealth: Payer: Self-pay

## 2018-08-15 MED ORDER — DOXYCYCLINE HYCLATE 100 MG PO TABS: 100.0000 mg | ORAL_TABLET | Freq: Two times a day (BID) | ORAL | 0 refills | Status: DC

## 2018-08-15 NOTE — Telephone Encounter (Signed)
Patient reports horsness x's 2 weeks. Patient reports she does have COPD so she coughs daily. Patient requesting antibiotic called into [facility] on [address]. Patient denies any other symptoms. Patient reports taking singulair daily.

## 2018-08-23 ENCOUNTER — Telehealth: Payer: Self-pay

## 2018-08-23 NOTE — Telephone Encounter (Signed)
Patient advised.

## 2018-08-23 NOTE — Telephone Encounter (Signed)
That's fine a1c can wait till June

## 2018-08-23 NOTE — Telephone Encounter (Signed)
Patient called and rescheduled her appointment to 10/28/2018 @ 11:00 AM due to the COVID-19 and states she will still come to get A1C checked in her car on 09/08/2018 if needs too. Patient was offered a e-visit and states she does not have a computer or smart phone to access to do a WebEx visit. Please advise.

## 2018-08-31 ENCOUNTER — Telehealth: Payer: Self-pay

## 2018-08-31 MED ORDER — ESCITALOPRAM OXALATE 10 MG PO TABS: 10.0000 mg | ORAL_TABLET | Freq: Every day | ORAL | 1 refills | Status: DC

## 2018-08-31 NOTE — Telephone Encounter (Signed)
Pt would like to go up to 10mg  on her Lexapro.  She states she is feeling depressed and increased stress.  She says there are some family issues going on and she is having a hard time dealing with it.  Pt denies suicidal thoughts.     She is also complaining of hoarseness.  She says it has been going on for several weeks, but worse today.  She recently finished a round of antibiotics for this.  (Doxycycline 100mg )  Pt reports it has not helped .  She denies fever, sinus pain/pressure, sore throat, reflux symptoms.  She states she has a slight cough secondary to allergies but is not short of breath or wheezing.    Pt uses [facility]   Thanks,   -Mickel Baas

## 2018-08-31 NOTE — Telephone Encounter (Signed)
Patient called and wants to know if Dr.Fisher could up her dose on her Lexapro 5 MG tablets and send her in a medication for harnesses. I advised patient is might have to schedule a  E-visit. L.O.V. 07/05/2018, please advise.

## 2018-08-31 NOTE — Telephone Encounter (Signed)
Please clarify. She is already taking 5mg  lexapro tablets. Why does she need to change  dose? And I don't understand about the harnesses?

## 2018-09-04 ENCOUNTER — Other Ambulatory Visit: Payer: Self-pay | Admitting: Family Medicine

## 2018-09-08 ENCOUNTER — Ambulatory Visit: Payer: Medicare Other | Admitting: Family Medicine

## 2018-09-19 ENCOUNTER — Other Ambulatory Visit: Payer: Self-pay | Admitting: Family Medicine

## 2018-09-19 MED ORDER — AMITRIPTYLINE HCL 25 MG PO TABS: 25.0000 mg | ORAL_TABLET | Freq: Every day | ORAL | 4 refills | Status: DC

## 2018-09-19 NOTE — Telephone Encounter (Signed)
[facility] faxed refill request for the following medications:  amitriptyline (ELAVIL) 25 MG tablet    Please advise.

## 2018-10-27 ENCOUNTER — Telehealth: Payer: Self-pay

## 2018-10-27 DIAGNOSIS — R490 Dysphonia: Secondary | ICD-10-CM

## 2018-10-27 NOTE — Telephone Encounter (Signed)
Patient called stating that she still has hoarsness. This is ongoing since 07/2018. She was given an antibiotic, but it didn't help.She has also been using throat lozenges with no relief. Patient wants to know what she should do?   Also, patient had an appointment tomorrow for DM f/u, but rescheduled due to her being exposed to her pastor, who has possible been exposed to a positive Covid  Case.

## 2018-10-28 ENCOUNTER — Ambulatory Visit: Payer: Self-pay | Admitting: Family Medicine

## 2018-10-28 NOTE — Telephone Encounter (Signed)
Patient has been advised. KW 

## 2018-10-28 NOTE — Telephone Encounter (Signed)
Looks like she has been complaining of hoarseness since March. She needs to see ent to have this evaluated. Have entered order for referral.

## 2018-11-11 DIAGNOSIS — B370 Candidal stomatitis: Secondary | ICD-10-CM | POA: Diagnosis not present

## 2018-11-11 DIAGNOSIS — J301 Allergic rhinitis due to pollen: Secondary | ICD-10-CM | POA: Diagnosis not present

## 2018-11-11 DIAGNOSIS — K219 Gastro-esophageal reflux disease without esophagitis: Secondary | ICD-10-CM | POA: Diagnosis not present

## 2018-11-11 DIAGNOSIS — B379 Candidiasis, unspecified: Secondary | ICD-10-CM | POA: Diagnosis not present

## 2018-11-11 DIAGNOSIS — R490 Dysphonia: Secondary | ICD-10-CM | POA: Diagnosis not present

## 2018-11-11 DIAGNOSIS — J381 Polyp of vocal cord and larynx: Secondary | ICD-10-CM | POA: Diagnosis not present

## 2018-11-25 ENCOUNTER — Ambulatory Visit: Payer: Self-pay | Admitting: Family Medicine

## 2018-12-06 DIAGNOSIS — J381 Polyp of vocal cord and larynx: Secondary | ICD-10-CM | POA: Diagnosis not present

## 2018-12-06 DIAGNOSIS — R490 Dysphonia: Secondary | ICD-10-CM | POA: Diagnosis not present

## 2018-12-19 ENCOUNTER — Ambulatory Visit: Payer: Self-pay | Admitting: Family Medicine

## 2018-12-29 ENCOUNTER — Other Ambulatory Visit: Payer: Self-pay

## 2018-12-29 MED ORDER — ESCITALOPRAM OXALATE 10 MG PO TABS: 10.0000 mg | ORAL_TABLET | Freq: Every day | ORAL | 1 refills | Status: DC

## 2019-01-02 ENCOUNTER — Other Ambulatory Visit: Payer: Self-pay | Admitting: Family Medicine

## 2019-01-02 DIAGNOSIS — I10 Essential (primary) hypertension: Secondary | ICD-10-CM

## 2019-01-10 ENCOUNTER — Other Ambulatory Visit: Payer: Self-pay

## 2019-01-10 ENCOUNTER — Encounter (INDEPENDENT_AMBULATORY_CARE_PROVIDER_SITE_OTHER): Payer: Medicare Other | Admitting: Ophthalmology

## 2019-01-10 DIAGNOSIS — H43813 Vitreous degeneration, bilateral: Secondary | ICD-10-CM

## 2019-01-10 DIAGNOSIS — E113293 Type 2 diabetes mellitus with mild nonproliferative diabetic retinopathy without macular edema, bilateral: Secondary | ICD-10-CM | POA: Diagnosis not present

## 2019-01-10 DIAGNOSIS — H35033 Hypertensive retinopathy, bilateral: Secondary | ICD-10-CM

## 2019-01-10 DIAGNOSIS — H35073 Retinal telangiectasis, bilateral: Secondary | ICD-10-CM

## 2019-01-10 DIAGNOSIS — I10 Essential (primary) hypertension: Secondary | ICD-10-CM

## 2019-01-10 DIAGNOSIS — E11319 Type 2 diabetes mellitus with unspecified diabetic retinopathy without macular edema: Secondary | ICD-10-CM | POA: Diagnosis not present

## 2019-01-19 DIAGNOSIS — J381 Polyp of vocal cord and larynx: Secondary | ICD-10-CM | POA: Diagnosis not present

## 2019-01-25 ENCOUNTER — Ambulatory Visit (INDEPENDENT_AMBULATORY_CARE_PROVIDER_SITE_OTHER): Payer: Medicare Other | Admitting: Family Medicine

## 2019-01-25 ENCOUNTER — Other Ambulatory Visit: Payer: Self-pay

## 2019-01-25 ENCOUNTER — Encounter: Payer: Self-pay | Admitting: Family Medicine

## 2019-01-25 VITALS — BP 171/74 | HR 76 | Temp 96.9°F | Resp 16 | Ht 67.0 in | Wt 159.2 lb

## 2019-01-25 DIAGNOSIS — E1140 Type 2 diabetes mellitus with diabetic neuropathy, unspecified: Secondary | ICD-10-CM

## 2019-01-25 DIAGNOSIS — R490 Dysphonia: Secondary | ICD-10-CM | POA: Diagnosis not present

## 2019-01-25 DIAGNOSIS — K219 Gastro-esophageal reflux disease without esophagitis: Secondary | ICD-10-CM

## 2019-01-25 DIAGNOSIS — Z23 Encounter for immunization: Secondary | ICD-10-CM

## 2019-01-25 DIAGNOSIS — F419 Anxiety disorder, unspecified: Secondary | ICD-10-CM

## 2019-01-25 DIAGNOSIS — I10 Essential (primary) hypertension: Secondary | ICD-10-CM

## 2019-01-25 DIAGNOSIS — J301 Allergic rhinitis due to pollen: Secondary | ICD-10-CM

## 2019-01-25 LAB — POCT GLYCOSYLATED HEMOGLOBIN (HGB A1C): Hemoglobin A1C: 6.6 % — AB (ref 4.0–5.6)

## 2019-01-25 MED ORDER — MONTELUKAST SODIUM 10 MG PO TABS: 10.0000 mg | ORAL_TABLET | Freq: Every day | ORAL | 4 refills | Status: DC

## 2019-01-25 MED ORDER — LISINOPRIL-HYDROCHLOROTHIAZIDE 20-25 MG PO TABS: 1.0000 | ORAL_TABLET | Freq: Every day | ORAL | 3 refills | Status: DC

## 2019-01-25 MED ORDER — ESCITALOPRAM OXALATE 10 MG PO TABS: 10.0000 mg | ORAL_TABLET | Freq: Every day | ORAL | 4 refills | Status: DC

## 2019-01-25 MED ORDER — OMEPRAZOLE 40 MG PO CPDR: 40.0000 mg | DELAYED_RELEASE_CAPSULE | Freq: Every day | ORAL | 4 refills | Status: DC

## 2019-01-25 NOTE — Progress Notes (Signed)
Patient: Erica Rivas Female    DO: Feb 24, 1941   78 y.o.   MRM: [id number] Visit Date: 01/25/2019  Today's Provider: Lelon Huh, MD   Chief Complaint  Patient presents with  . Diabetes  . Hypertension  . Follow-up    COPD   Subjective:     HPI  Diabetes Mellitus Type II, Follow-up:   Lab Results  Component Value Date   HGBA1C 6.6 (A) 01/25/2019   HGBA1C 7.1 (H) 05/25/2018   HGBA1C 7.5 (A) 11/23/2017    Last seen for diabetes 8 months ago.  Management since then includes no changes. She reports excellent compliance with treatment. She is not having side effects.  Current symptoms include none and have been stable. Home blood sugar records: fasting range: 120's  Episodes of hypoglycemia? no   Current insulin regiment: Is not on insulin Most Recent Eye Exam: UTD Weight trend: stable Prior visit with dietician: No Current exercise: none Current diet habits: in general, a "healthy" diet    Pertinent Labs:    Component Value Date/Time   CHOL 135 05/25/2018 0807   TRIG 111 05/25/2018 0807   HDL 44 05/25/2018 0807   LDLCALC 69 05/25/2018 0807   CREATININE 1.15 (H) 05/25/2018 0807    Wt Readings from Last 3 Encounters:  01/25/19 159 lb 3.2 oz (72.2 kg)  07/29/18 162 lb (73.5 kg)  07/05/18 165 lb (74.8 kg)    ------------------------------------------------------------------------  Hypertension, follow-up:  BP Readings from Last 3 Encounters:  01/25/19 (!) 171/74  07/05/18 (!) 144/80  05/24/18 (!) 165/85    She was last seen for hypertension 7 months ago.  BP at that visit was 144/80. Management changes since that visit include no changes. She reports excellent compliance with treatment. She is not having side effects.  She is not exercising. She is adherent to low salt diet.   Outside blood pressures are stable. She is experiencing none.  Patient denies chest pain.   Cardiovascular risk factors include advanced age (older than 26 for  men, 68 for women), diabetes mellitus and smoking/ tobacco exposure.  Use of agents associated with hypertension: none.     Weight trend: stable Wt Readings from Last 3 Encounters:  01/25/19 159 lb 3.2 oz (72.2 kg)  07/29/18 162 lb (73.5 kg)  07/05/18 165 lb (74.8 kg)    Current diet: in general, a "healthy" diet    ------------------------------------------------------------------------  Follow up for COPD  The patient was last seen for this 8 months ago. Changes made at last visit include treated with prednisone and Levaquin.  She reports excellent compliance with treatment. Patient reports using symbicort BID, and albuterol once or twice a daily. She feels that condition is Improved. She is not having side effects.   ------------------------------------------------------------------------------------    No Known Allergies   Current Outpatient Medications:  .  albuterol (PROAIR HFA) 108 (90 Base) MCG/ACT inhaler, INHALE TWO PUFFS BY MOUTH EVERY 6 HOURS AS NEEDED FOR WHEEZING OR  SHORTNESS  OF  BREATH, Disp: 9 each, Rfl: 5 .  amitriptyline (ELAVIL) 25 MG tablet, Take 1 tablet (25 mg total) by mouth at bedtime., Disp: 90 tablet, Rfl: 4 .  aspirin EC 81 MG tablet, Take 81 mg by mouth daily., Disp: , Rfl:  .  atenolol (TENORMIN) 50 MG tablet, TAKE 1 TABLET BY MOUTH TWO  TIMES DAILY, Disp: 180 tablet, Rfl: 3 .  atorvastatin (LIPITOR) 40 MG tablet, TAKE 1 TABLET BY MOUTH  DAILY, Disp:  90 tablet, Rfl: 3 .  budesonide-formoterol (SYMBICORT) 160-4.5 MCG/ACT inhaler, Inhale 2 puffs into the lungs 2 (two) times daily., Disp: 1 Inhaler, Rfl: 11 .  Cholecalciferol (VITAMIN D) 2000 UNITS tablet, Take 1,000 Units by mouth daily. , Disp: , Rfl:  .  escitalopram (LEXAPRO) 10 MG tablet, Take 1 tablet (10 mg total) by mouth daily., Disp: 90 tablet, Rfl: 1 .  ferrous sulfate 325 (65 FE) MG tablet, Take 325 mg by mouth daily with breakfast., Disp: , Rfl:  .  furosemide (LASIX) 20 MG tablet,  Take 1 tablet (20 mg total) by mouth daily as needed (swelling)., Disp: 10 tablet, Rfl: 4 .  glipiZIDE (GLUCOTROL XL) 10 MG 24 hr tablet, TAKE 1 TABLET BY MOUTH  DAILY, Disp: 90 tablet, Rfl: 4 .  glucose blood (ONE TOUCH ULTRA TEST) test strip, Use as instructed to check blood sugar daily for type 2 diabetes E11.9, Disp: 100 each, Rfl: 3 .  lisinopril-hydrochlorothiazide (PRINZIDE,ZESTORETIC) 20-12.5 MG tablet, Take 2 tablets by mouth daily., Disp: 180 tablet, Rfl: 3 .  metFORMIN (GLUCOPHAGE) 1000 MG tablet, TAKE 1 TABLET BY MOUTH TWO  TIMES DAILY, Disp: 180 tablet, Rfl: 3 .  montelukast (SINGULAIR) 10 MG tablet, Take 1 tablet (10 mg total) by mouth daily., Disp: 90 tablet, Rfl: 4 .  omeprazole (PRILOSEC) 40 MG capsule, TAKE 1 CAPSULE BY MOUTH TWO TIMES DAILY, Disp: 180 capsule, Rfl: 3 .  ONETOUCH DELICA LANCETS 48N MISC, Use to check blood sugar daily. Type 2 diabetes E11.9, Disp: 100 each, Rfl: 3 .  oxyCODONE-acetaminophen (PERCOCET/ROXICET) 5-325 MG tablet, Take 1 tablet by mouth 2 (two) times daily as needed. For pain (FYI from pharmacy that only 14 tablets or 7 days supply were dispensed from recent prescription for 60 tablets) (Patient not taking: Reported on 01/25/2019), Disp: 14 tablet, Rfl: 0 .  valACYclovir (VALTREX) 1000 MG tablet, 2 tablets twice a day for 1 day as needed for cold sores (Patient not taking: Reported on 01/11/2018), Disp: 12 tablet, Rfl: 1  Review of Systems  Constitutional: Negative.   HENT: Positive for congestion.   Eyes: Negative.   Respiratory: Positive for cough and shortness of breath.   Cardiovascular: Negative.   Endocrine: Negative.     Social History   Tobacco Use  . Smoking status: Current Every Day Smoker    Packs/day: 0.50    Years: 50.00    Pack years: 25.00    Types: Cigarettes  . Smokeless tobacco: Never Used  Substance Use Topics  . Alcohol use: No    Alcohol/week: 0.0 standard drinks      Objective:   BP (!) 171/74 (BP Location: Left  Arm, Patient Position: Sitting, Cuff Size: Large)   Pulse 76   Temp (!) 96.9 F (36.1 C) (Temporal)   Resp 16   Ht 5\' 7"  (1.702 m)   Wt 159 lb 3.2 oz (72.2 kg)   SpO2 99%   BMI 24.93 kg/m  Vitals:   01/25/19 1105  BP: (!) 171/74  Pulse: 76  Resp: 16  Temp: (!) 96.9 F (36.1 C)  TempSrc: Temporal  SpO2: 99%  Weight: 159 lb 3.2 oz (72.2 kg)  Height: 5\' 7"  (1.702 m)  Body mass index is 24.93 kg/m.   Physical Exam   General Appearance:    Alert, cooperative, no distress  Eyes:    PERRL, conjunctiva/corneas clear, EOM's intact       Lungs:     Clear to auscultation bilaterally, respirations unlabored  Heart:  Normal heart rate. Normal rhythm. No murmurs, rubs, or gallops.   MS:   All extremities are intact.   Neurologic:   Awake, alert, oriented x 3. No apparent focal neurological           defect.       Results for orders placed or performed in visit on 01/25/19  POCT glycosylated hemoglobin (Hb A1C)  Result Value Ref Range   Hemoglobin A1C 6.6 (A) 4.0 - 5.6 %       Assessment & Plan    1. Type 2 diabetes mellitus with diabetic neuropathy, without long-term current use of insulin (HCC) Well controlled.  Continue current medications.    2. Need for influenza vaccination  - Flu Vaccine QUAD High Dose(Fluad)  3. Hoarseness of voice Due to GERD per ENT, had increased omeprazole to 40 BID, will cut her back on 40QD as below.   4. Essential hypertension increase- lisinopril-hydrochlorothiazide (ZESTORETIC) 20-25 MG tablet; Take 1 tablet by mouth daily.  Dispense: 180 tablet; Refill: 3  5. Gastroesophageal reflux disease, esophagitis presence not specified  - omeprazole (PRILOSEC) 40 MG capsule; Take 1 capsule (40 mg total) by mouth daily.  Dispense: 90 capsule; Refill: 4  6. Allergic rhinitis due to pollen, unspecified seasonality Well controlled per patient, needs refill- montelukast (SINGULAIR) 10 MG tablet; Take 1 tablet (10 mg total) by mouth daily.   Dispense: 90 tablet; Refill: 4  7. Anxiety Will controlled per patient, needs refill- escitalopram (LEXAPRO) 10 MG tablet; Take 1 tablet (10 mg total) by mouth daily.  Dispense: 90 tablet; Refill: 4 Future Appointments  Date Time Provider [place]  06/02/2019 11:00 AM Birdie Sons, MD [facility] None  01/10/2020  9:00 AM Hayden Pedro, MD [facility] None          Lelon Huh, MD  [facility]

## 2019-01-25 NOTE — Patient Instructions (Signed)
.   Please review the attached list of medications and notify my office if there are any errors.   . Please bring all of your medications to every appointment so we can make sure that our medication list is the same as yours.   . It is especially important to get the annual flu vaccine this year. If you haven't had it already, please go to your pharmacy or call the office as soon as possible to schedule you flu shot.  

## 2019-01-30 ENCOUNTER — Telehealth: Payer: Self-pay | Admitting: Family Medicine

## 2019-01-30 DIAGNOSIS — F419 Anxiety disorder, unspecified: Secondary | ICD-10-CM

## 2019-01-30 DIAGNOSIS — J301 Allergic rhinitis due to pollen: Secondary | ICD-10-CM

## 2019-01-30 NOTE — Telephone Encounter (Signed)
Looks like both prescriptions were sent into the pharmacy on 01/25/2019 during patient's last office visit. Patient advised.

## 2019-01-30 NOTE — Telephone Encounter (Signed)
Pt needing refills on:  montelukast (SINGULAIR) 10 MG tablet escitalopram (LEXAPRO) 10 MG tablet  Please fill at:  [place], [place] [phone] (Phone) [phone] (Fax)     Thanks, [facility]

## 2019-02-08 ENCOUNTER — Other Ambulatory Visit: Payer: Self-pay | Admitting: Family Medicine

## 2019-03-21 DIAGNOSIS — H35373 Puckering of macula, bilateral: Secondary | ICD-10-CM | POA: Diagnosis not present

## 2019-03-21 DIAGNOSIS — H52223 Regular astigmatism, bilateral: Secondary | ICD-10-CM | POA: Diagnosis not present

## 2019-03-21 DIAGNOSIS — H5203 Hypermetropia, bilateral: Secondary | ICD-10-CM | POA: Diagnosis not present

## 2019-03-21 DIAGNOSIS — H524 Presbyopia: Secondary | ICD-10-CM | POA: Diagnosis not present

## 2019-04-23 ENCOUNTER — Other Ambulatory Visit: Payer: Self-pay | Admitting: Family Medicine

## 2019-05-23 DIAGNOSIS — L0211 Cutaneous abscess of neck: Secondary | ICD-10-CM | POA: Diagnosis not present

## 2019-05-23 DIAGNOSIS — Z85828 Personal history of other malignant neoplasm of skin: Secondary | ICD-10-CM | POA: Diagnosis not present

## 2019-05-23 DIAGNOSIS — C44729 Squamous cell carcinoma of skin of left lower limb, including hip: Secondary | ICD-10-CM | POA: Diagnosis not present

## 2019-05-23 DIAGNOSIS — L578 Other skin changes due to chronic exposure to nonionizing radiation: Secondary | ICD-10-CM | POA: Diagnosis not present

## 2019-06-02 ENCOUNTER — Ambulatory Visit: Payer: Self-pay | Admitting: Family Medicine

## 2019-06-26 ENCOUNTER — Ambulatory Visit (INDEPENDENT_AMBULATORY_CARE_PROVIDER_SITE_OTHER): Payer: Medicare Other | Admitting: Family Medicine

## 2019-06-26 ENCOUNTER — Other Ambulatory Visit: Payer: Self-pay

## 2019-06-26 ENCOUNTER — Encounter: Payer: Self-pay | Admitting: Family Medicine

## 2019-06-26 VITALS — BP 192/90 | HR 69 | Temp 96.6°F | Resp 16 | Wt 159.0 lb

## 2019-06-26 DIAGNOSIS — I700 Atherosclerosis of aorta: Secondary | ICD-10-CM

## 2019-06-26 DIAGNOSIS — N1831 Chronic kidney disease, stage 3a: Secondary | ICD-10-CM

## 2019-06-26 DIAGNOSIS — E1140 Type 2 diabetes mellitus with diabetic neuropathy, unspecified: Secondary | ICD-10-CM | POA: Diagnosis not present

## 2019-06-26 DIAGNOSIS — E559 Vitamin D deficiency, unspecified: Secondary | ICD-10-CM

## 2019-06-26 DIAGNOSIS — I2510 Atherosclerotic heart disease of native coronary artery without angina pectoris: Secondary | ICD-10-CM

## 2019-06-26 DIAGNOSIS — E7800 Pure hypercholesterolemia, unspecified: Secondary | ICD-10-CM

## 2019-06-26 DIAGNOSIS — D519 Vitamin B12 deficiency anemia, unspecified: Secondary | ICD-10-CM

## 2019-06-26 DIAGNOSIS — E2839 Other primary ovarian failure: Secondary | ICD-10-CM

## 2019-06-26 DIAGNOSIS — I10 Essential (primary) hypertension: Secondary | ICD-10-CM | POA: Diagnosis not present

## 2019-06-26 NOTE — Progress Notes (Signed)
Patient: Erica Rivas Female    DOB: 08-10-1940   79 y.o.   MRN: [id number] Visit Date: 06/26/2019  Today's Provider: Lelon Huh, MD   Chief Complaint  Patient presents with  . Diabetes   Subjective:     HPI  Diabetes Mellitus Type II, Follow-up:   Lab Results  Component Value Date   HGBA1C 6.6 (A) 01/25/2019   HGBA1C 7.1 (H) 05/25/2018   HGBA1C 7.5 (A) 11/23/2017    Last seen for diabetes 5 months ago.  Management since then includes no changes. She reports good compliance with treatment. She is not having side effects.  Current symptoms include none and have been stable. Home blood sugar records: fasting range: 90-120  Episodes of hypoglycemia? yes - occasionally   Current insulin regiment: Is not on insulin Most Recent Eye Exam: <1 year ago Weight trend: stable Prior visit with dietician: No Current exercise: none Current diet habits: well balanced  Pertinent Labs:    Component Value Date/Time   CHOL 135 05/25/2018 0807   TRIG 111 05/25/2018 0807   HDL 44 05/25/2018 0807   LDLCALC 69 05/25/2018 0807   CREATININE 1.15 (H) 05/25/2018 0807    Wt Readings from Last 3 Encounters:  06/26/19 159 lb (72.1 kg)  01/25/19 159 lb 3.2 oz (72.2 kg)  07/29/18 162 lb (73.5 kg)    ------------------------------------------------------------------------  Hypertension, follow-up:  BP Readings from Last 3 Encounters:  06/26/19 (!) 192/90  01/25/19 (!) 171/74  07/05/18 (!) 144/80    She was last seen for hypertension 5 months ago.  BP at that visit was 171/74. Management since that visit includes increasing Lisinopril-HCTZ to 20-25mg  daily. She reports good compliance with treatment. She is not having side effects.  She is not exercising. She is adherent to low salt diet.   Outside blood pressures are occasionally checked. She is experiencing none.  Patient denies chest pain, chest pressure/discomfort, claudication, dyspnea, exertional chest  pressure/discomfort, fatigue, irregular heart beat, lower extremity edema, near-syncope, orthopnea, palpitations, paroxysmal nocturnal dyspnea, syncope and tachypnea.   Cardiovascular risk factors include advanced age (older than 6 for men, 81 for women), diabetes mellitus and hypertension.  Use of agents associated with hypertension: NSAIDS.     Weight trend: stable Wt Readings from Last 3 Encounters:  06/26/19 159 lb (72.1 kg)  01/25/19 159 lb 3.2 oz (72.2 kg)  07/29/18 162 lb (73.5 kg)    Current diet: well balanced  ------------------------------------------------------------------------  Lipid/Cholesterol, Follow-up:   Last seen for this1 years ago.  Management changes since that visit include none. . Last Lipid Panel:    Component Value Date/Time   CHOL 135 05/25/2018 0807   TRIG 111 05/25/2018 0807   HDL 44 05/25/2018 0807   CHOLHDL 3.1 05/25/2018 0807   LDLCALC 69 05/25/2018 0807    Risk factors for vascular disease include diabetes mellitus, hypercholesterolemia and hypertension  She reports good compliance with treatment. She is not having side effects.  Current symptoms include none and have been stable. Weight trend: stable Prior visit with dietician: no Current diet: well balanced Current exercise: none  Wt Readings from Last 3 Encounters:  06/26/19 159 lb (72.1 kg)  01/25/19 159 lb 3.2 oz (72.2 kg)  07/29/18 162 lb (73.5 kg)    -------------------------------------------------------------------  No Known Allergies   Current Outpatient Medications:  .  albuterol (PROAIR HFA) 108 (90 Base) MCG/ACT inhaler, INHALE TWO PUFFS BY MOUTH EVERY 6 HOURS AS NEEDED FOR WHEEZING OR  SHORTNESS  OF  BREATH, Disp: 9 each, Rfl: 5 .  amitriptyline (ELAVIL) 25 MG tablet, Take 1 tablet (25 mg total) by mouth at bedtime., Disp: 90 tablet, Rfl: 4 .  aspirin EC 81 MG tablet, Take 81 mg by mouth daily., Disp: , Rfl:  .  atenolol (TENORMIN) 50 MG tablet, TAKE 1 TABLET BY  MOUTH TWO  TIMES DAILY, Disp: 180 tablet, Rfl: 3 .  atorvastatin (LIPITOR) 40 MG tablet, TAKE 1 TABLET BY MOUTH  DAILY, Disp: 90 tablet, Rfl: 3 .  Cholecalciferol (VITAMIN D) 2000 UNITS tablet, Take 1,000 Units by mouth daily. , Disp: , Rfl:  .  escitalopram (LEXAPRO) 10 MG tablet, Take 1 tablet (10 mg total) by mouth daily., Disp: 90 tablet, Rfl: 4 .  ferrous sulfate 325 (65 FE) MG tablet, Take 325 mg by mouth daily with breakfast., Disp: , Rfl:  .  furosemide (LASIX) 20 MG tablet, Take 1 tablet (20 mg total) by mouth daily as needed (swelling)., Disp: 10 tablet, Rfl: 4 .  glipiZIDE (GLUCOTROL XL) 10 MG 24 hr tablet, TAKE 1 TABLET BY MOUTH  DAILY, Disp: 90 tablet, Rfl: 0 .  glucose blood (ONE TOUCH ULTRA TEST) test strip, Use as instructed to check blood sugar daily for type 2 diabetes E11.9, Disp: 100 each, Rfl: 3 .  lisinopril-hydrochlorothiazide (ZESTORETIC) 20-25 MG tablet, Take 1 tablet by mouth daily., Disp: 180 tablet, Rfl: 3 .  metFORMIN (GLUCOPHAGE) 1000 MG tablet, TAKE 1 TABLET BY MOUTH TWO  TIMES DAILY, Disp: 180 tablet, Rfl: 3 .  montelukast (SINGULAIR) 10 MG tablet, Take 1 tablet (10 mg total) by mouth daily., Disp: 90 tablet, Rfl: 4 .  omeprazole (PRILOSEC) 40 MG capsule, Take 1 capsule (40 mg total) by mouth daily., Disp: 90 capsule, Rfl: 4 .  ONETOUCH DELICA LANCETS 50D MISC, Use to check blood sugar daily. Type 2 diabetes E11.9, Disp: 100 each, Rfl: 3 .  SYMBICORT 160-4.5 MCG/ACT inhaler, Inhale 2 puffs by mouth twice daily, Disp: 11 g, Rfl: 5 .  valACYclovir (VALTREX) 1000 MG tablet, 2 tablets twice a day for 1 day as needed for cold sores, Disp: 12 tablet, Rfl: 1  Review of Systems  Constitutional: Negative for appetite change, chills, fatigue and fever.  Respiratory: Negative for chest tightness and shortness of breath.   Cardiovascular: Negative for chest pain and palpitations.  Gastrointestinal: Negative for abdominal pain, nausea and vomiting.  Neurological: Negative for  dizziness and weakness.    Social History   Tobacco Use  . Smoking status: Current Every Day Smoker    Packs/day: 0.50    Years: 50.00    Pack years: 25.00    Types: Cigarettes  . Smokeless tobacco: Never Used  Substance Use Topics  . Alcohol use: No    Alcohol/week: 0.0 standard drinks      Objective:   BP (!) 192/90 (BP Location: Right Arm, Patient Position: Sitting, Cuff Size: Large)   Pulse 69   Temp (!) 96.6 F (35.9 C) (Temporal)   Resp 16   Wt 159 lb (72.1 kg)   SpO2 96% Comment: room air  BMI 24.90 kg/m  Vitals:   06/26/19 0853 06/26/19 0912  BP: (!) 180/90 (!) 192/90  Pulse: 69   Resp: 16   Temp: (!) 96.6 F (35.9 C)   TempSrc: Temporal   SpO2: 96%   Weight: 159 lb (72.1 kg)   Body mass index is 24.9 kg/m.   Physical Exam   General: Appearance:  Well developed, well nourished female in no acute distress  Eyes:    PERRL, conjunctiva/corneas clear, EOM's intact       Lungs:     Clear to auscultation bilaterally, respirations unlabored  Heart:    Normal heart rate. Normal rhythm. No murmurs, rubs, or gallops.   MS:   All extremities are intact.   Neurologic:   Awake, alert, oriented x 3. No apparent focal neurological           defect.        No results found for any visits on 06/26/19.     Assessment & Plan    1. Type 2 diabetes mellitus with diabetic neuropathy, without long-term current use of insulin (HCC)  - Hemoglobin A1c - TSH  2. Essential hypertension Did not take her medications this morning, but SBP has been consistently elevated the last several visits. Anticipate increasing another - TSH  3. Stage 3a chronic kidney disease   4. Anemia due to vitamin B12 deficiency, unspecified B12 deficiency type  - Vitamin B12 - CBC  5. Atherosclerosis of aorta (HCC) Asymptomatic. Compliant with medication.  Continue aggressive risk factor modification.   - TSH  6. Atherosclerosis of native coronary artery of native heart  without angina pectoris Asymptomatic. Compliant with medication.  Continue aggressive risk factor modification.    - Comprehensive metabolic panel - Lipid panel  7. Pure hypercholesterolemia She is tolerating atorvastatin well with no adverse effects.    8. Vitamin D deficiency  - VITAMIN D 25 Hydroxy (Vit-D Deficiency, Fractures)  9. Estrogen deficiency  - DG Bone Density; Future  The entirety of the information documented in the History of Present Illness, Review of Systems and Physical Exam were personally obtained by me. Portions of this information were initially documented by Meyer Cory, CMA and reviewed by me for thoroughness and accuracy.        Lelon Huh, MD  [facility]

## 2019-06-27 ENCOUNTER — Telehealth: Payer: Self-pay

## 2019-06-27 LAB — COMPREHENSIVE METABOLIC PANEL
ALT: 14 IU/L (ref 0–32)
AST: 17 IU/L (ref 0–40)
Albumin/Globulin Ratio: 1.7 (ref 1.2–2.2)
Albumin: 4.3 g/dL (ref 3.7–4.7)
Alkaline Phosphatase: 94 IU/L (ref 39–117)
BUN/Creatinine Ratio: 16 (ref 12–28)
BUN: 21 mg/dL (ref 8–27)
Bilirubin Total: 0.3 mg/dL (ref 0.0–1.2)
CO2: 24 mmol/L (ref 20–29)
Calcium: 10.3 mg/dL (ref 8.7–10.3)
Chloride: 97 mmol/L (ref 96–106)
Creatinine, Ser: 1.33 mg/dL — ABNORMAL HIGH (ref 0.57–1.00)
GFR calc Af Amer: 44 mL/min/{1.73_m2} — ABNORMAL LOW (ref >59)
GFR calc non Af Amer: 39 mL/min/{1.73_m2} — ABNORMAL LOW (ref >59)
Globulin, Total: 2.6 g/dL (ref 1.5–4.5)
Glucose: 128 mg/dL — ABNORMAL HIGH (ref 65–99)
Potassium: 4.8 mmol/L (ref 3.5–5.2)
Sodium: 137 mmol/L (ref 134–144)
Total Protein: 6.9 g/dL (ref 6.0–8.5)

## 2019-06-27 LAB — CBC
Hematocrit: 35.5 % (ref 34.0–46.6)
Hemoglobin: 11.8 g/dL (ref 11.1–15.9)
MCH: 28.4 pg (ref 26.6–33.0)
MCHC: 33.2 g/dL (ref 31.5–35.7)
MCV: 85 fL (ref 79–97)
Platelets: 268 10*3/uL (ref 150–450)
RBC: 4.16 x10E6/uL (ref 3.77–5.28)
RDW: 13.1 % (ref 11.7–15.4)
WBC: 12.5 10*3/uL — ABNORMAL HIGH (ref 3.4–10.8)

## 2019-06-27 LAB — LIPID PANEL
Chol/HDL Ratio: 2.5 ratio (ref 0.0–4.4)
Cholesterol, Total: 152 mg/dL (ref 100–199)
HDL: 62 mg/dL (ref >39)
LDL Chol Calc (NIH): 75 mg/dL (ref 0–99)
Triglycerides: 81 mg/dL (ref 0–149)
VLDL Cholesterol Cal: 15 mg/dL (ref 5–40)

## 2019-06-27 LAB — HEMOGLOBIN A1C
Est. average glucose Bld gHb Est-mCnc: 154 mg/dL
Hgb A1c MFr Bld: 7 % — ABNORMAL HIGH (ref 4.8–5.6)

## 2019-06-27 LAB — VITAMIN B12: Vitamin B-12: 368 pg/mL (ref 232–1245)

## 2019-06-27 LAB — VITAMIN D 25 HYDROXY (VIT D DEFICIENCY, FRACTURES): Vit D, 25-Hydroxy: 38.3 ng/mL (ref 30.0–100.0)

## 2019-06-27 LAB — TSH: TSH: 1.63 u[IU]/mL (ref 0.450–4.500)

## 2019-06-27 MED ORDER — AMLODIPINE BESYLATE 2.5 MG PO TABS: 2.5000 mg | ORAL_TABLET | Freq: Every day | ORAL | 1 refills | Status: DC

## 2019-06-27 NOTE — Telephone Encounter (Signed)
Patient advised. RX sent to [facility]. 3 month follow up scheduled.

## 2019-06-27 NOTE — Telephone Encounter (Signed)
-----   Message from Birdie Sons, MD sent at 06/27/2019  7:50 AM EST ----- Labs normal. Need to add amlodipine 2.5mg  once a day for better control of blood pressure. Please send #[phone] with 1 refill to her mail order pharmacy.  Please schedule follow up for hypertension and diabetes in 3 months.

## 2019-07-05 DIAGNOSIS — L578 Other skin changes due to chronic exposure to nonionizing radiation: Secondary | ICD-10-CM | POA: Diagnosis not present

## 2019-07-05 DIAGNOSIS — Z85828 Personal history of other malignant neoplasm of skin: Secondary | ICD-10-CM | POA: Diagnosis not present

## 2019-07-05 DIAGNOSIS — Z872 Personal history of diseases of the skin and subcutaneous tissue: Secondary | ICD-10-CM | POA: Diagnosis not present

## 2019-07-05 DIAGNOSIS — L570 Actinic keratosis: Secondary | ICD-10-CM | POA: Diagnosis not present

## 2019-07-11 ENCOUNTER — Other Ambulatory Visit: Payer: Medicare Other

## 2019-07-15 ENCOUNTER — Other Ambulatory Visit: Payer: Self-pay | Admitting: Family Medicine

## 2019-07-15 NOTE — Telephone Encounter (Signed)
Requested Prescriptions  Pending Prescriptions Disp Refills  . glipiZIDE (GLUCOTROL XL) 10 MG 24 hr tablet [Pharmacy Med Name: GlipiZIDE XL TAB 10MG ] 90 tablet 1    Sig: TAKE 1 TABLET BY MOUTH  DAILY     Endocrinology:  Diabetes - Sulfonylureas Passed - 07/15/2019  6:40 AM      Passed - HBA1C is between 0 and 7.9 and within 180 days    Hgb A1c MFr Bld  Date Value Ref Range Status  06/26/2019 7.0 (H) 4.8 - 5.6 % Final    Comment:             Prediabetes: 5.7 - 6.4          Diabetes: >6.4          Glycemic control for adults with diabetes: <7.0          Passed - Valid encounter within last 6 months    Recent Outpatient Visits          2 weeks ago Type 2 diabetes mellitus with diabetic neuropathy, without long-term current use of insulin ([place])   [facility] Birdie Sons, MD   5 months ago Type 2 diabetes mellitus with diabetic neuropathy, without long-term current use of insulin [facility])   [facility] Birdie Sons, MD   1 year ago Essential hypertension   [facility] Birdie Sons, MD   1 year ago Essential hypertension   [facility] Birdie Sons, MD   1 year ago COPD exacerbation [facility])   [facility] Carmon Gins[place], [place]      Future Appointments            In 2 months Fisher, Kirstie Peri, MD [facility] [place]           . atorvastatin (LIPITOR) 40 MG tablet [Pharmacy Med Name: ATORVASTATIN  40MG   TAB] 90 tablet 3    Sig: TAKE 1 TABLET BY MOUTH  DAILY     Cardiovascular:  Antilipid - Statins Failed - 07/15/2019  6:40 AM      Failed - LDL in normal range and within 360 days    LDL Chol Calc (NIH)  Date Value Ref Range Status  06/26/2019 75 0 - 99 mg/dL Final         Passed - Total Cholesterol in normal range and within 360 days    Cholesterol, Total  Date Value Ref Range Status  06/26/2019 152 100 - 199 mg/dL Final         Passed - HDL in normal range and within 360 days     HDL  Date Value Ref Range Status  06/26/2019 62 >39 mg/dL Final         Passed - Triglycerides in normal range and within 360 days    Triglycerides  Date Value Ref Range Status  06/26/2019 81 0 - 149 mg/dL Final         Passed - Patient is not pregnant      Passed - Valid encounter within last 12 months    Recent Outpatient Visits          2 weeks ago Type 2 diabetes mellitus with diabetic neuropathy, without long-term current use of insulin ([place])   [facility] Birdie Sons, MD   5 months ago Type 2 diabetes mellitus with diabetic neuropathy, without long-term current use of insulin [facility])   [facility] Birdie Sons, MD   1 year  ago Essential hypertension   [facility] Birdie Sons, MD   1 year ago Essential hypertension   [facility] Birdie Sons, MD   1 year ago COPD exacerbation [facility])   [facility] Carmon Gins[place], [place]      Future Appointments            In 2 months Fisher, Kirstie Peri, MD [facility] [place]

## 2019-07-28 ENCOUNTER — Telehealth: Payer: Self-pay | Admitting: *Deleted

## 2019-07-28 NOTE — Telephone Encounter (Signed)
(  07/28/19) Unable to leave message for pt to notify them that it is time to schedule annual low dose lung cancer screening CT scan. Will call back to verify information prior to the scan being scheduled [facility]

## 2019-08-08 ENCOUNTER — Telehealth: Payer: Self-pay

## 2019-08-08 DIAGNOSIS — Z87891 Personal history of nicotine dependence: Secondary | ICD-10-CM

## 2019-08-08 NOTE — Telephone Encounter (Signed)
Patient has been notified that lung cancer screening CT scan is due currently or will be in near future.  Confirmed that patient is within the appropriate age range and asymptomatic, (no signs or symptoms of lung cancer).  Patient denies illness that would prevent curative treatment for lung cancer if found.  Verified smoking history (current smoker for 50 years, 1 pack per day).  Patient is agreeable for CT scan being scheduled.  Patient prefers a mid-morning appointment.

## 2019-08-11 NOTE — Telephone Encounter (Signed)
Smoking history: current, 53 pack year

## 2019-08-11 NOTE — Addendum Note (Signed)
Addended by: Lieutenant Diego on: 08/11/2019 02:22 PM   Modules accepted: Orders

## 2019-08-14 NOTE — Telephone Encounter (Signed)
Patient informed of low dose lung cancer screening CT scan appointment 08/17/19 at 11:00 am.

## 2019-08-17 ENCOUNTER — Other Ambulatory Visit: Payer: Self-pay

## 2019-08-17 ENCOUNTER — Ambulatory Visit
Admission: RE | Admit: 2019-08-17 | Discharge: 2019-08-17 | Disposition: A | Discharge status: Home / Self Care | Payer: Medicare Other | Source: Ambulatory Visit | Attending: Oncology | Admitting: Oncology

## 2019-08-17 DIAGNOSIS — Z87891 Personal history of nicotine dependence: Secondary | ICD-10-CM | POA: Diagnosis not present

## 2019-08-17 DIAGNOSIS — F17210 Nicotine dependence, cigarettes, uncomplicated: Secondary | ICD-10-CM | POA: Diagnosis not present

## 2019-08-22 ENCOUNTER — Encounter: Payer: Self-pay | Admitting: *Deleted

## 2019-08-22 IMAGING — CT CT CHEST LUNG CANCER SCREENING LOW DOSE
2 of 5 series · 15 of 40 positions shown, 18 images · non-contrast
Comparison: Low-dose lung cancer screening chest CT 07/28/2017.

CLINICAL DATA: 78-year-old female current smoker with 52 pack-year
history of smoking. Lung cancer screening examination.

EXAM:
CT CHEST WITHOUT CONTRAST LOW-DOSE FOR LUNG CANCER SCREENING
TECHNIQUE: Multidetector CT imaging of the chest was performed following the
standard protocol without IV contrast.

[Series 10: lung · axial · 0.70mm/px · z∈[-1307,-996]mm · 12 of 347 slices shown, 15 images (1 of 2)]
[im 18/347  mediastinal]
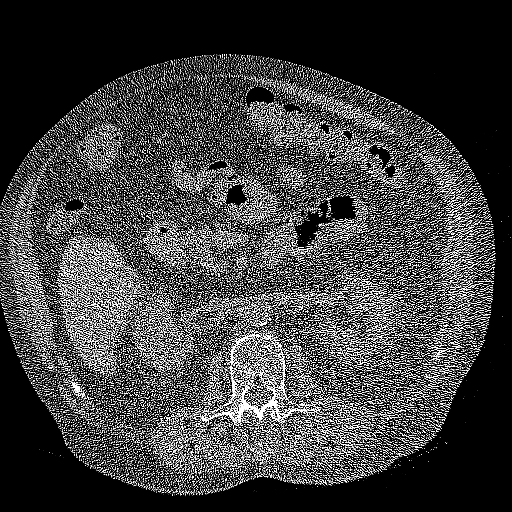
[im 18/347  lung]
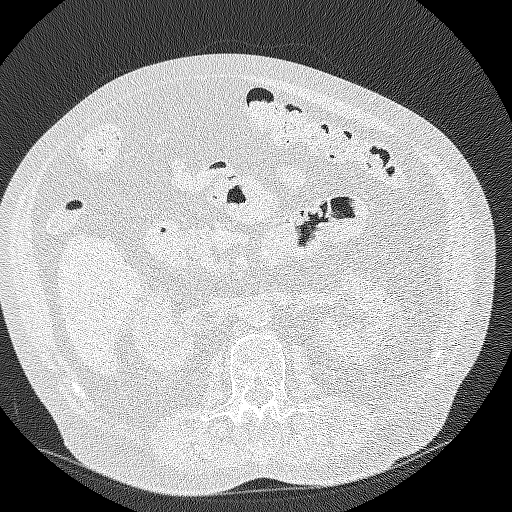
[im 52/347  lung]
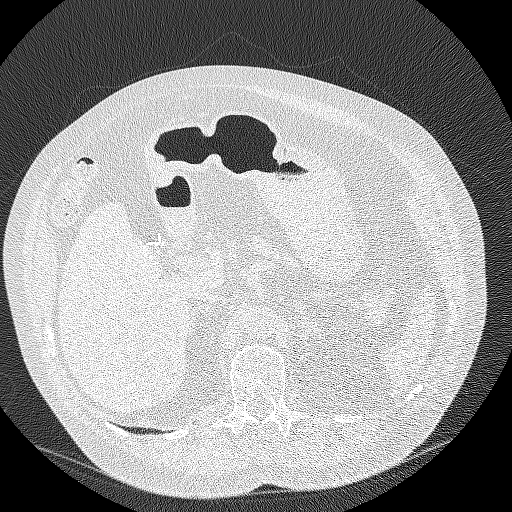
[im 70/347  lung]
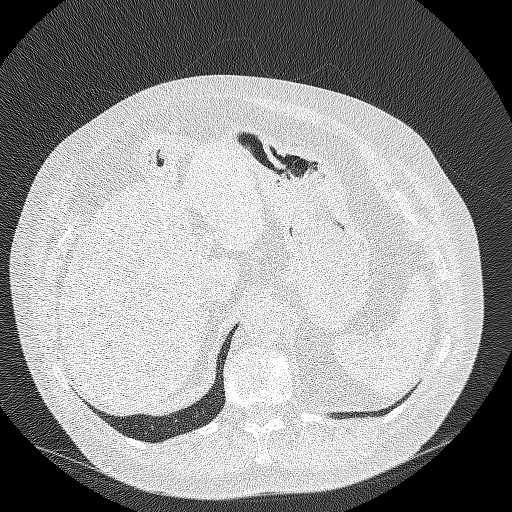
[im 104/347  lung]
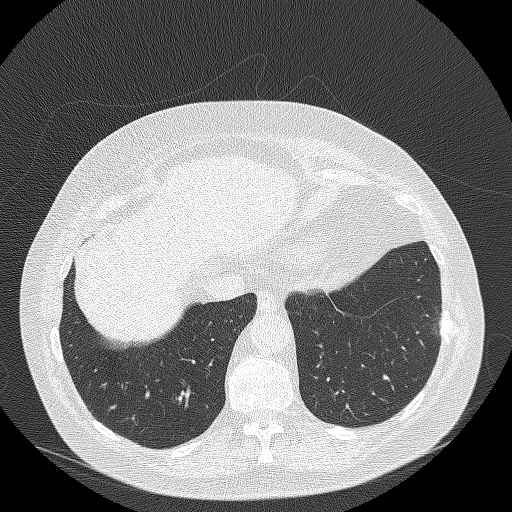
[im 139/347  mediastinal]
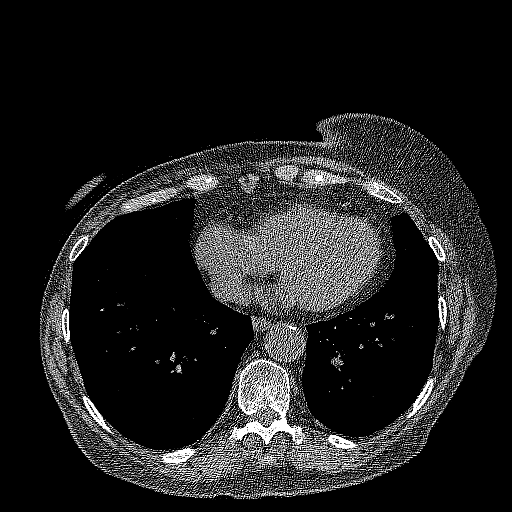
[im 139/347  lung]
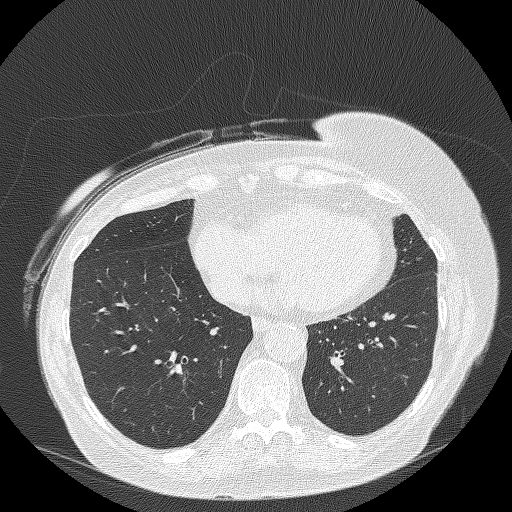
[im 156/347  lung]
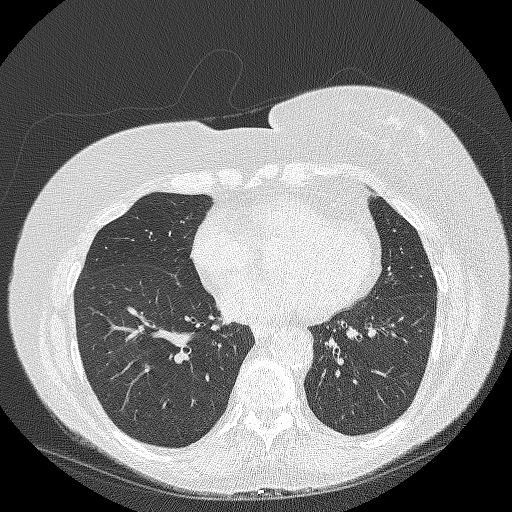
[im 191/347  lung]
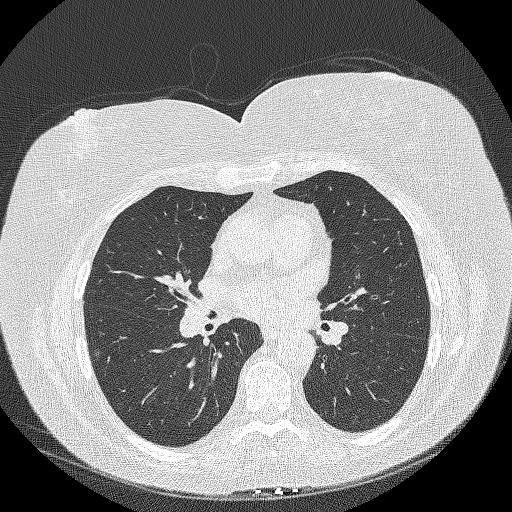
[im 208/347  lung]
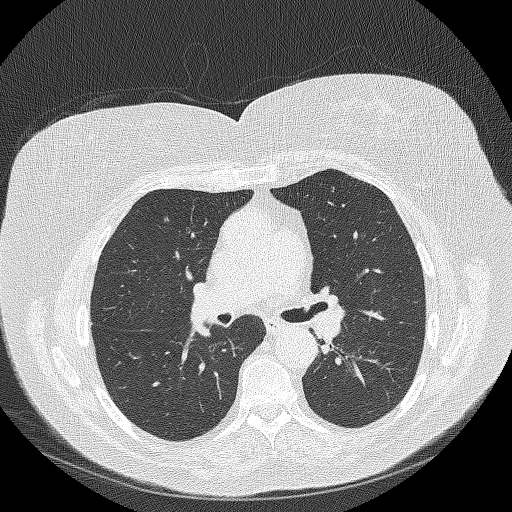
[im 243/347  mediastinal]
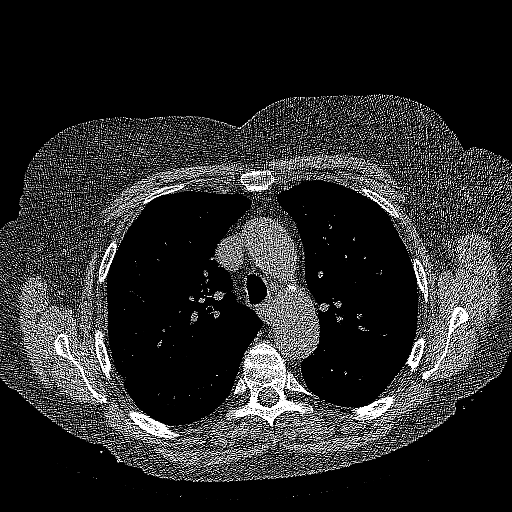
[im 243/347  lung]
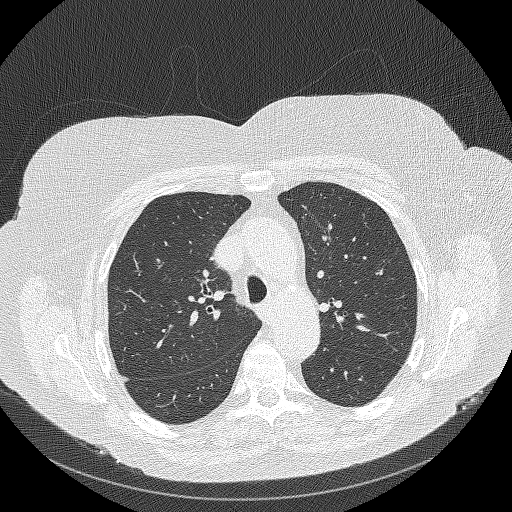
[im 277/347  lung]
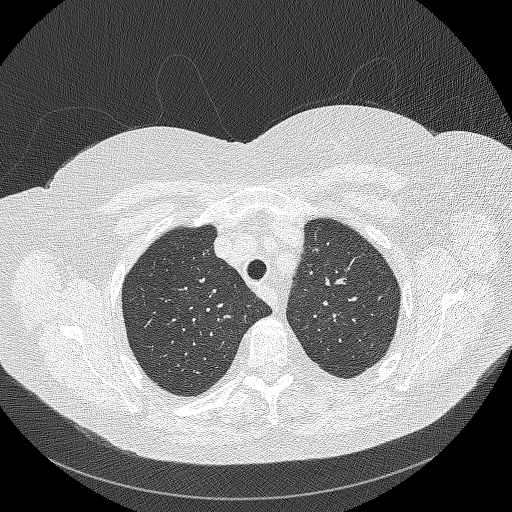
[im 295/347  lung]
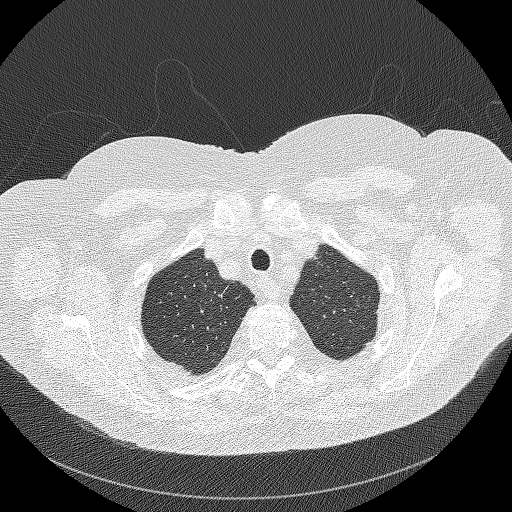
[im 329/347  lung]
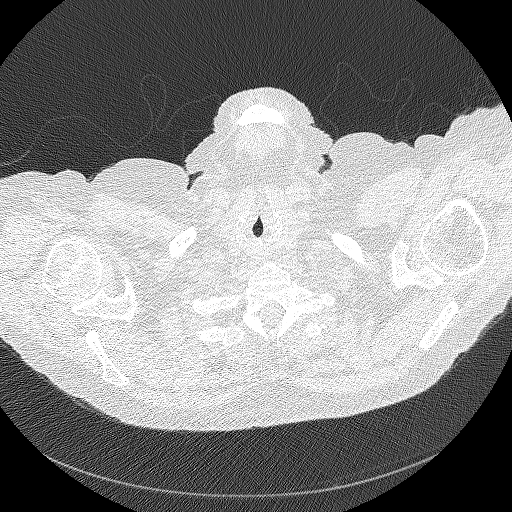

[Series 11: lung · coronal · 0.65mm/px · 3 of 343 slices shown (2 of 2)]
[im 69/343  lung]
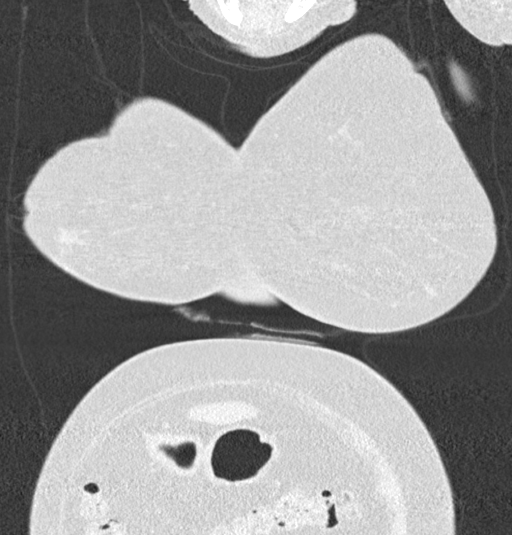
[im 137/343  lung]
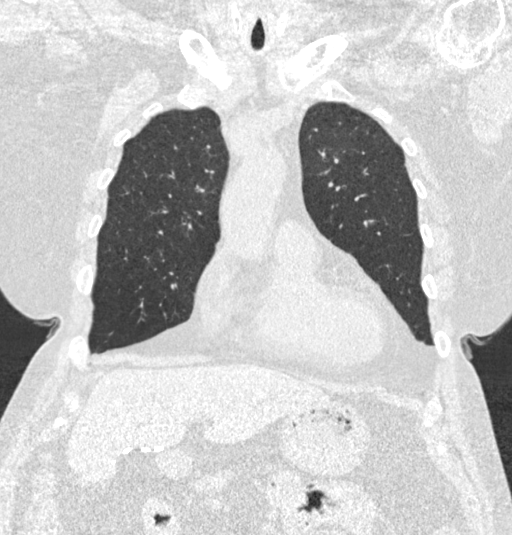
[im 206/343  lung]
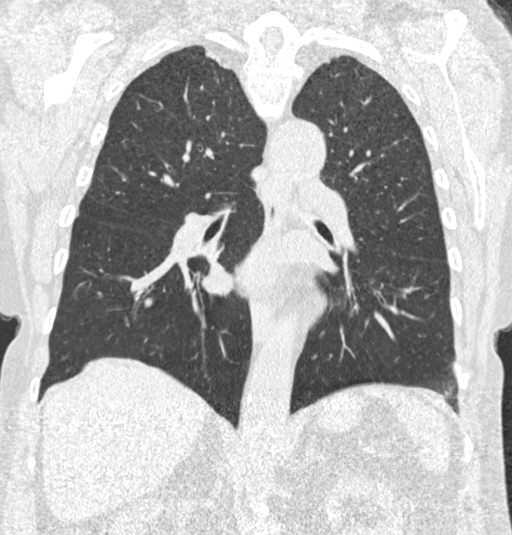

[15 of 40 positions shown; findings below may reference images not displayed]

FINDINGS: Cardiovascular: Heart size is normal. There is no significant
pericardial fluid, thickening or pericardial calcification. There is
aortic atherosclerosis, as well as atherosclerosis of the great
vessels of the mediastinum and the coronary arteries, including
calcified atherosclerotic plaque in the left main, left anterior
descending, left circumflex and right coronary arteries.
Calcifications of the aortic valve.

Mediastinum/Nodes: No pathologically enlarged mediastinal or hilar
lymph nodes. Please note that accurate exclusion of hilar adenopathy
is limited on noncontrast CT scans. Esophagus is unremarkable in
appearance. No axillary lymphadenopathy.

Lungs/Pleura: Tiny calcified granuloma in the right middle lobe. No
other suspicious appearing pulmonary nodules or masses are noted. No
acute consolidative airspace disease. No pleural effusions. Diffuse
bronchial wall thickening with mild centrilobular and paraseptal
emphysema.

Upper Abdomen: Aortic atherosclerosis. Status post cholecystectomy.
Calcified granuloma in the posterior aspect of the right lobe of the
liver incidentally noted.

Musculoskeletal: There are no aggressive appearing lytic or blastic
lesions noted in the visualized portions of the skeleton.
IMPRESSION: 1. Lung-RADS 1S, negative. Continue annual screening with low-dose
chest CT without contrast in 12 months.
2. The "S" modifier above refers to potentially clinically
significant non lung cancer related findings. Specifically, there is
aortic atherosclerosis, in addition to left main and 3 vessel
coronary artery disease. Assessment for potential risk factor
modification, dietary therapy or pharmacologic therapy may be
warranted, if clinically indicated.
3. Mild diffuse bronchial wall thickening with mild centrilobular
and paraseptal emphysema; imaging findings suggestive of underlying
COPD.

Aortic Atherosclerosis (C3Y9K-LCM.M) and Emphysema (C3Y9K-PO1.R).

## 2019-08-26 ENCOUNTER — Other Ambulatory Visit: Payer: Self-pay | Admitting: Family Medicine

## 2019-08-26 NOTE — Telephone Encounter (Signed)
Requested Prescriptions  Pending Prescriptions Disp Refills  . SYMBICORT 160-4.5 MCG/ACT inhaler [Pharmacy Med Name: Symbicort 160-4.5 MCG/ACT Inhalation Aerosol] 11 g 0    Sig: Inhale 2 puffs by mouth twice daily     Pulmonology:  Combination Products Passed - 08/26/2019  9:29 AM      Passed - Valid encounter within last 12 months    Recent Outpatient Visits          2 months ago Type 2 diabetes mellitus with diabetic neuropathy, without long-term current use of insulin ([place])   [facility] Birdie Sons, MD   7 months ago Type 2 diabetes mellitus with diabetic neuropathy, without long-term current use of insulin [facility])   [facility] Birdie Sons, MD   1 year ago Essential hypertension   [facility] Birdie Sons, MD   1 year ago Essential hypertension   [facility] Birdie Sons, MD   1 year ago COPD exacerbation [facility])   [facility] [place]on [place], [place]      Future Appointments            In 1 week Ralene Bathe, MD [place]   In 1 month Fisher, Kirstie Peri, MD [facility], [place]

## 2019-09-06 ENCOUNTER — Ambulatory Visit: Payer: Medicare Other | Admitting: Dermatology

## 2019-09-14 ENCOUNTER — Telehealth: Payer: Self-pay

## 2019-09-14 NOTE — Telephone Encounter (Signed)
She already has known vascular disease and is on appropriate medications. Will check on the carotid arteries at her appt in May.

## 2019-09-14 NOTE — Telephone Encounter (Signed)
I called NP Hoyle Sauer and advised her as below.

## 2019-09-14 NOTE — Telephone Encounter (Signed)
Copied from [place] [id number]. Topic: General - Other >> Sep 13, 2019  4:07 PM Oneta Rack wrote: Osvaldo Human name: Hoyle Sauer  Relation to pt: NP from [place]  Call back number: [phone]    Reason for call:  NP examined  patient today and findings were "bruit on the right carotid artery" . NP would like PCP or nurse to follow up with patient for further recommendations.

## 2019-09-22 NOTE — Progress Notes (Signed)
Established patient visit   Patient: Erica Rivas   DOB: 1940-10-08   79 y.o. Female  MRN: [id number] Visit Date: 09/25/2019  Today's healthcare provider: Lelon Huh, MD   Chief Complaint  Patient presents with  . Diabetes  . Hypertension   Subjective    HPI Diabetes Mellitus Type II, Follow-up  Lab Results  Component Value Date   HGBA1C 7.0 (H) 06/26/2019   HGBA1C 6.6 (A) 01/25/2019   HGBA1C 7.1 (H) 05/25/2018   Last seen for diabetes 3 months ago.  Management since then includes continuing same medication. She reports good compliance with treatment. She is not having side effects.  Symptoms: Yes fatigue No foot ulcerations Yes appetite changes No nausea Yes paresthesia (numbness or tingling) of the feet  No polydipsia (excessive thirst) No polyuria (frequent urination) No visual disturbances  No vomiting  Home blood sugar records: fasting range: 100-112  Episodes of hypoglycemia? Yes shaky last night with glucose reading of 74, but only has only had one or two episodes per year.   Current insulin regiment: none Most Recent Eye Exam: not UTD Current exercise: none Current diet habits: well balanced  Pertinent Labs: Lab Results  Component Value Date   CHOL 152 06/26/2019   HDL 62 06/26/2019   LDLCALC 75 06/26/2019   TRIG 81 06/26/2019   CHOLHDL 2.5 06/26/2019   Lab Results  Component Value Date   NA 137 06/26/2019   K 4.8 06/26/2019   CO2 24 06/26/2019   GLUCOSE 128 (H) 06/26/2019   BUN 21 06/26/2019   CREATININE 1.33 (H) 06/26/2019   CALCIUM 10.3 06/26/2019   GFRNONAA 39 (L) 06/26/2019   GFRAA 44 (L) 06/26/2019     Wt Readings from Last 3 Encounters:  09/25/19 161 lb (73 kg)  08/17/19 159 lb (72.1 kg)  06/26/19 159 lb (72.1 kg)    --------------------------------------------------------------------------------------------------- Hypertension, follow-up  BP Readings from Last 3 Encounters:  09/25/19 (!) 150/72  06/26/19 (!)  192/90  01/25/19 (!) 171/74   Wt Readings from Last 3 Encounters:  09/25/19 161 lb (73 kg)  08/17/19 159 lb (72.1 kg)  06/26/19 159 lb (72.1 kg)     She was last seen for hypertension 3 months ago.  BP at that visit was 192/90. Management since that visit includes adding amlodipine 2.5mg  once a day for better control of blood pressure.  She reports good compliance with treatment. She is not having side effects.  She is following a Regular diet. She is exercising. She does smoke.  Use of agents associated with hypertension: NSAIDS.   Outside blood pressures are not checked. Symptoms: No chest pain No chest pressure No palpitations Yes dyspnea Yes orthopnea No paroxysmal nocturnal dyspnea No lower extremity edema No syncope  The 10-year ASCVD risk score Mikey Bussing DC Jr., et al., 2013) is: 67.5%   ---------------------------------------------------------------------------------------------------  She was also noted to have fairly extensive coronary disease on LDCT and home nurse evaluation found her to have right carotid bruit recently . She is taking atorvastatin consistently and is working on cutting back on smoking.  Social History   Tobacco Use  . Smoking status: Current Every Day Smoker    Packs/day: 0.75    Years: 50.00    Pack years: 37.50    Types: Cigarettes  . Smokeless tobacco: Never Used  Substance Use Topics  . Alcohol use: No    Alcohol/week: 0.0 standard drinks  . Drug use: No       Medications:  Outpatient Medications Prior to Visit  Medication Sig  . albuterol (PROAIR HFA) 108 (90 Base) MCG/ACT inhaler INHALE TWO PUFFS BY MOUTH EVERY 6 HOURS AS NEEDED FOR WHEEZING OR  SHORTNESS  OF  BREATH  . amitriptyline (ELAVIL) 25 MG tablet Take 1 tablet (25 mg total) by mouth at bedtime.  Marland Kitchen amLODipine (NORVASC) 2.5 MG tablet Take 1 tablet (2.5 mg total) by mouth daily.  Marland Kitchen aspirin EC 81 MG tablet Take 81 mg by mouth daily.  Marland Kitchen atenolol (TENORMIN) 50 MG tablet TAKE  1 TABLET BY MOUTH TWO  TIMES DAILY  . atorvastatin (LIPITOR) 40 MG tablet TAKE 1 TABLET BY MOUTH  DAILY  . Cholecalciferol (VITAMIN D) 2000 UNITS tablet Take 1,000 Units by mouth daily.   Marland Kitchen escitalopram (LEXAPRO) 10 MG tablet Take 1 tablet (10 mg total) by mouth daily.  . furosemide (LASIX) 20 MG tablet Take 1 tablet (20 mg total) by mouth daily as needed (swelling).  Marland Kitchen glipiZIDE (GLUCOTROL XL) 10 MG 24 hr tablet TAKE 1 TABLET BY MOUTH  DAILY  . glucose blood (ONE TOUCH ULTRA TEST) test strip Use as instructed to check blood sugar daily for type 2 diabetes E11.9  . lisinopril-hydrochlorothiazide (ZESTORETIC) 20-25 MG tablet Take 1 tablet by mouth daily.  . metFORMIN (GLUCOPHAGE) 1000 MG tablet TAKE 1 TABLET BY MOUTH  TWICE DAILY  . montelukast (SINGULAIR) 10 MG tablet Take 1 tablet (10 mg total) by mouth daily.  Marland Kitchen omeprazole (PRILOSEC) 40 MG capsule Take 1 capsule (40 mg total) by mouth daily.  Glory Rosebush DELICA LANCETS 33L MISC Use to check blood sugar daily. Type 2 diabetes E11.9  . SYMBICORT 160-4.5 MCG/ACT inhaler Inhale 2 puffs by mouth twice daily  . valACYclovir (VALTREX) 1000 MG tablet 2 tablets twice a day for 1 day as needed for cold sores  . [DISCONTINUED] ferrous sulfate 325 (65 FE) MG tablet Take 325 mg by mouth daily with breakfast.   No facility-administered medications prior to visit.    Review of Systems  Constitutional: Positive for fatigue. Negative for appetite change, chills and fever.  Respiratory: Positive for cough (productive clear sputum) and shortness of breath. Negative for chest tightness.   Cardiovascular: Negative for chest pain and palpitations.  Gastrointestinal: Negative for abdominal pain, nausea and vomiting.  Neurological: Negative for dizziness and weakness.      Objective    BP (!) 150/72 (BP Location: Left Arm, Cuff Size: Normal)   Pulse 68   Temp (!) 96.9 F (36.1 C) (Temporal)   Resp 16   Wt 161 lb (73 kg)   SpO2 99% Comment: room air   BMI 25.22 kg/m    Physical Exam   General: Appearance:     Overweight female in no acute distress  Eyes:    PERRL, conjunctiva/corneas clear, EOM's intact       Lungs:     Clear to auscultation bilaterally, respirations unlabored  Heart:    Normal heart rate. Normal rhythm. No murmurs, rubs, or gallops.   MS:   All extremities are intact.   Neurologic:   Awake, alert, oriented x 3. No apparent focal neurological           defect.        Results for orders placed or performed in visit on 09/25/19  POCT HgB A1C  Result Value Ref Range   Hemoglobin A1C 7.0 (A) 4.0 - 5.6 %   Est. average glucose Bld gHb Est-mCnc 154     Assessment &  Plan     1. Type 2 diabetes mellitus with diabetic neuropathy, without long-term current use of insulin (HCC) Well controlled.  Continue current medications.    2. Right carotid bruit  - US Carotid Duplex Bilateral; Future  3. Essential hypertension .improved, but not to goal. Increase amlodipine from 2.5 to 5mg  daily - amLODipine (NORVASC) 5 MG tablet; Take 1 tablet (5 mg total) by mouth daily.  Dispense: 90 tablet; Refill: 3  4. COPD exacerbation (HCC) refill 18 g; Refill: 3  5. Smoking greater than 30 pack years Encouraged to stop smoking. She states she has had covid vaccine and will bring record to next visit.   6. Centrilobular emphysema (HCC)  - albuterol (PROAIR HFA) 108 (90 Base) MCG/ACT inhaler; INHALE TWO PUFFS BY MOUTH EVERY 6 HOURS AS NEEDED FOR WHEEZING OR  SHORTNESS  OF  BREATH  Dispense:  - SYMBICORT 160-4.5 MCG/ACT inhaler; Inhale 2 puffs into the lungs 2 (two) times daily.  Dispense: 3 Inhaler; Refill: 4   No follow-ups on file.      The entirety of the information documented in the History of Present Illness, Review of Systems and Physical Exam were personally obtained by me. Portions of this information were initially documented by the CMA and reviewed by me for thoroughness and accuracy.      Lelon Huh, MD    [facility] [phone] (phone) [phone] (fax)  [place]

## 2019-09-23 ENCOUNTER — Other Ambulatory Visit: Payer: Self-pay | Admitting: Family Medicine

## 2019-09-25 ENCOUNTER — Encounter: Payer: Self-pay | Admitting: Family Medicine

## 2019-09-25 ENCOUNTER — Ambulatory Visit (INDEPENDENT_AMBULATORY_CARE_PROVIDER_SITE_OTHER): Payer: Medicare Other | Admitting: Family Medicine

## 2019-09-25 ENCOUNTER — Other Ambulatory Visit: Payer: Self-pay

## 2019-09-25 VITALS — BP 150/72 | HR 68 | Temp 96.9°F | Resp 16 | Wt 161.0 lb

## 2019-09-25 DIAGNOSIS — R0989 Other specified symptoms and signs involving the circulatory and respiratory systems: Secondary | ICD-10-CM | POA: Diagnosis not present

## 2019-09-25 DIAGNOSIS — J432 Centrilobular emphysema: Secondary | ICD-10-CM

## 2019-09-25 DIAGNOSIS — J441 Chronic obstructive pulmonary disease with (acute) exacerbation: Secondary | ICD-10-CM | POA: Diagnosis not present

## 2019-09-25 DIAGNOSIS — I10 Essential (primary) hypertension: Secondary | ICD-10-CM | POA: Diagnosis not present

## 2019-09-25 DIAGNOSIS — E1140 Type 2 diabetes mellitus with diabetic neuropathy, unspecified: Secondary | ICD-10-CM | POA: Diagnosis not present

## 2019-09-25 DIAGNOSIS — F17210 Nicotine dependence, cigarettes, uncomplicated: Secondary | ICD-10-CM

## 2019-09-25 DIAGNOSIS — J439 Emphysema, unspecified: Secondary | ICD-10-CM | POA: Insufficient documentation

## 2019-09-25 LAB — POCT GLYCOSYLATED HEMOGLOBIN (HGB A1C)
Est. average glucose Bld gHb Est-mCnc: 154
Hemoglobin A1C: 7 % — AB (ref 4.0–5.6)

## 2019-09-25 MED ORDER — ALBUTEROL SULFATE HFA 108 (90 BASE) MCG/ACT IN AERS: INHALATION_SPRAY | RESPIRATORY_TRACT | 3 refills | Status: DC

## 2019-09-25 MED ORDER — AMLODIPINE BESYLATE 5 MG PO TABS: 5.0000 mg | ORAL_TABLET | Freq: Every day | ORAL | 3 refills | Status: DC

## 2019-09-25 MED ORDER — SYMBICORT 160-4.5 MCG/ACT IN AERO: 2.0000 | INHALATION_SPRAY | Freq: Two times a day (BID) | RESPIRATORY_TRACT | 4 refills | Status: DC

## 2019-09-25 NOTE — Patient Instructions (Signed)
.   Please review the attached list of medications and notify my office if there are any errors.   . Please bring a copy of your Covid vaccination card to your next appointment so we can update your medical record

## 2019-09-26 ENCOUNTER — Other Ambulatory Visit: Payer: Self-pay | Admitting: Family Medicine

## 2019-09-26 DIAGNOSIS — Z1231 Encounter for screening mammogram for malignant neoplasm of breast: Secondary | ICD-10-CM

## 2019-10-03 ENCOUNTER — Ambulatory Visit
Admission: RE | Admit: 2019-10-03 | Discharge: 2019-10-03 | Disposition: A | Discharge status: Home / Self Care | Payer: Medicare Other | Source: Ambulatory Visit | Attending: Family Medicine | Admitting: Family Medicine

## 2019-10-03 DIAGNOSIS — Z1231 Encounter for screening mammogram for malignant neoplasm of breast: Secondary | ICD-10-CM | POA: Insufficient documentation

## 2019-10-05 ENCOUNTER — Ambulatory Visit
Admission: RE | Admit: 2019-10-05 | Discharge: 2019-10-05 | Disposition: A | Discharge status: Home / Self Care | Payer: Medicare Other | Source: Ambulatory Visit | Attending: Family Medicine | Admitting: Family Medicine

## 2019-10-05 ENCOUNTER — Other Ambulatory Visit: Payer: Self-pay

## 2019-10-05 ENCOUNTER — Other Ambulatory Visit: Payer: Self-pay | Admitting: Family Medicine

## 2019-10-05 DIAGNOSIS — R0989 Other specified symptoms and signs involving the circulatory and respiratory systems: Secondary | ICD-10-CM | POA: Diagnosis not present

## 2019-10-05 DIAGNOSIS — E7800 Pure hypercholesterolemia, unspecified: Secondary | ICD-10-CM

## 2019-10-05 DIAGNOSIS — I6521 Occlusion and stenosis of right carotid artery: Secondary | ICD-10-CM

## 2019-10-05 DIAGNOSIS — I6529 Occlusion and stenosis of unspecified carotid artery: Secondary | ICD-10-CM | POA: Insufficient documentation

## 2019-10-05 DIAGNOSIS — I6523 Occlusion and stenosis of bilateral carotid arteries: Secondary | ICD-10-CM | POA: Diagnosis not present

## 2019-10-05 MED ORDER — ATORVASTATIN CALCIUM 80 MG PO TABS: 80.0000 mg | ORAL_TABLET | Freq: Every day | ORAL | 3 refills | Status: DC

## 2019-10-06 ENCOUNTER — Telehealth: Payer: Self-pay

## 2019-10-06 DIAGNOSIS — I6521 Occlusion and stenosis of right carotid artery: Secondary | ICD-10-CM

## 2019-10-06 NOTE — Telephone Encounter (Signed)
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 Need referral to vascular for follow up.  Also  need to increase atorvastatin to 80mg  daily, #90 rf x 3 which does a better job at clearing arteries than the 40mg  dosage. I went ahead and sent prescription for the new dosage to her mail order pharmacy

## 2019-10-06 NOTE — Telephone Encounter (Signed)
Patient advised. Referral placed

## 2019-10-20 ENCOUNTER — Telehealth: Payer: Self-pay | Admitting: Family Medicine

## 2019-10-20 NOTE — Telephone Encounter (Signed)
Copied from [place] [id number]. Topic: General - Other >> Oct 20, 2019  8:58 AM Hinda Lenis D wrote: Reason for CRM: PT has some personals questions for Dr Caryn Section Nurse please return her call

## 2019-10-31 ENCOUNTER — Telehealth: Payer: Self-pay | Admitting: Family Medicine

## 2019-10-31 NOTE — Chronic Care Management (AMB) (Signed)
  Chronic Care Management   Note  10/31/2019 Name: Erica Rivas MRN: [id number] DOB: 1941-03-12  Erica Rivas is a 79 y.o. year old female who is a primary care patient of Fisher, Kirstie Peri, MD. I reached out to [place] by phone today in response to a referral sent by Ms. Miguel Aschoff Woodroof's health plan.     Ms. Miler was given information about Chronic Care Management services today including:  1. CCM service includes personalized support from designated clinical staff supervised by her physician, including individualized plan of care and coordination with other care providers 2. 24/7 contact phone numbers for assistance for urgent and routine care needs. 3. Service will only be billed when office clinical staff spend 20 minutes or more in a month to coordinate care. 4. Only one practitioner may furnish and bill the service in a calendar month. 5. The patient may stop CCM services at any time (effective at the end of the month) by phone call to the office staff. 6. The patient will be responsible for cost sharing (co-pay) of up to 20% of the service fee (after annual deductible is met).  Patient did not agree to enrollment in care management services and does not wish to consider at this time.  Follow up plan: The care management team is available to follow up with the patient after provider conversation with the patient regarding recommendation for care management engagement and subsequent re-referral to the care management team.   Noreene Larsson, [place], [place], [place] [address] Direct Dial: [phone] [email or web address]: [email or web address]

## 2019-11-03 ENCOUNTER — Other Ambulatory Visit: Payer: Self-pay | Admitting: Family Medicine

## 2019-11-04 ENCOUNTER — Other Ambulatory Visit: Payer: Self-pay | Admitting: Family Medicine

## 2019-11-04 NOTE — Telephone Encounter (Signed)
Requested Prescriptions  Pending Prescriptions Disp Refills  . OneTouch Delica Lancets 61B MISC [Pharmacy Med Name: Jonetta Speak LAN 37H MIS] 432 each 3    Sig: USE TO CHECK BLOOD SUGAR DAILY     Endocrinology: Diabetes - Testing Supplies Passed - 11/04/2019 11:00 AM      Passed - Valid encounter within last 12 months    Recent Outpatient Visits          1 month ago Type 2 diabetes mellitus with diabetic neuropathy, without long-term current use of insulin ([place])   [facility] Birdie Sons, MD   4 months ago Type 2 diabetes mellitus with diabetic neuropathy, without long-term current use of insulin ([place])   [facility] Birdie Sons, MD   9 months ago Type 2 diabetes mellitus with diabetic neuropathy, without long-term current use of insulin [facility])   [facility] Birdie Sons, MD   1 year ago Essential hypertension   [place], Kirstie Peri, MD   1 year ago Essential hypertension   [place], Kirstie Peri, MD      Future Appointments            In 2 months Fisher, Kirstie Peri, MD [facility], [place]

## 2019-11-07 ENCOUNTER — Telehealth (INDEPENDENT_AMBULATORY_CARE_PROVIDER_SITE_OTHER): Payer: Self-pay

## 2019-11-07 ENCOUNTER — Encounter (INDEPENDENT_AMBULATORY_CARE_PROVIDER_SITE_OTHER): Payer: Self-pay | Admitting: Vascular Surgery

## 2019-11-07 ENCOUNTER — Other Ambulatory Visit: Payer: Self-pay

## 2019-11-07 ENCOUNTER — Ambulatory Visit (INDEPENDENT_AMBULATORY_CARE_PROVIDER_SITE_OTHER): Payer: Medicare Other | Admitting: Vascular Surgery

## 2019-11-07 VITALS — BP 185/72 | HR 70 | Resp 16 | Ht 67.0 in | Wt 161.6 lb

## 2019-11-07 DIAGNOSIS — I10 Essential (primary) hypertension: Secondary | ICD-10-CM | POA: Diagnosis not present

## 2019-11-07 DIAGNOSIS — E7800 Pure hypercholesterolemia, unspecified: Secondary | ICD-10-CM

## 2019-11-07 DIAGNOSIS — I6523 Occlusion and stenosis of bilateral carotid arteries: Secondary | ICD-10-CM | POA: Diagnosis not present

## 2019-11-07 DIAGNOSIS — E1140 Type 2 diabetes mellitus with diabetic neuropathy, unspecified: Secondary | ICD-10-CM | POA: Diagnosis not present

## 2019-11-07 DIAGNOSIS — N1832 Chronic kidney disease, stage 3b: Secondary | ICD-10-CM | POA: Diagnosis not present

## 2019-11-07 MED ORDER — CLOPIDOGREL BISULFATE 75 MG PO TABS
75.0000 mg | ORAL_TABLET | Freq: Every day | ORAL | 6 refills | Status: DC
Start: 2019-11-07 — End: 2020-03-20

## 2019-11-07 NOTE — Patient Instructions (Signed)
Carotid Artery Disease  Carotid artery disease is the narrowing or blockage of one or both carotid arteries. This condition is also called carotid artery stenosis. The carotid arteries are the two main blood vessels on either side of the neck. They send blood to the brain, other parts of the head, and the neck.  This condition increases your risk for a stroke or a transient ischemic attack (TIA). A TIA is a "mini-stroke" that causes stroke-like symptoms that go away quickly. What are the causes? This condition is mainly caused by a narrowing and hardening of the carotid arteries. The carotid arteries can become narrow or clogged with a buildup of plaque. Plaque includes:  Fat.  Cholesterol.  Calcium.  Other substances. What increases the risk? The following factors may make you more likely to develop this condition:  Having certain medical conditions, such as: ? High cholesterol. ? High blood pressure. ? Diabetes. ? Obesity.  Smoking.  A family history of cardiovascular disease.  Not being active or lack of regular exercise.  Being female. Men have a higher risk of having arteries become narrow and harden earlier in life than women.  Old age. What are the signs or symptoms? This condition may not have any signs or symptoms until a stroke or TIA happens. In some cases, your doctor may be able to hear a whooshing sound. This can suggest a change in blood flow caused by plaque buildup. An eye exam can also help find signs of the condition. How is this treated? This condition may be treated with more than one treatment. Treatment options include:  Lifestyle changes, such as: ? Quitting smoking. ? Getting regular exercise, or getting exercise as told by your doctor. ? Eating a healthy diet. ? Managing stress. ? Keeping a healthy weight.  Medicines to control: ? Blood pressure. ? Cholesterol. ? Blood clotting.  Surgery. You may have: ? A surgery to remove the blockages in  the carotid arteries. ? A procedure in which a small mesh tube (stent) is used to widen the blocked carotid arteries. Follow these instructions at home: Eating and drinking Follow instructions about your diet from your doctor. It is important to follow a healthy diet.  Eat a diet that includes: ? A lot of fresh fruits and vegetables. ? Low-fat (lean) meats.  Avoid these foods: ? Foods that are high in fat. ? Foods that are high in salt (sodium). ? Foods that are fried. ? Foods that are processed. ? Foods that have few good nutrients (poor nutritional value).  Lifestyle   Keep a healthy weight.  Do exercises as told by your doctor to stay active. Each week, you should get one of the following: ? At least 150 minutes of exercise that raises your heart rate and makes you sweat (moderate-intensity exercise). ? At least 75 minutes of exercise that takes a lot of effort.  Do not use any products that contain nicotine or tobacco, such as cigarettes, e-cigarettes, and chewing tobacco. If you need help quitting, ask your doctor.  Do not drink alcohol if: ? Your doctor tells you not to drink. ? You are pregnant, may be pregnant, or are planning to become pregnant.  If you drink alcohol: ? Limit how much you use to:  0-1 drink a day for women.  0-2 drinks a day for men. ? Be aware of how much alcohol is in your drink. In the [place] one drink equals one 12 oz bottle of beer (355 mL), one 5   oz glass of wine (148 mL), or one 1 oz glass of hard liquor (44 mL).  Do not use drugs.  Manage your stress. Ask your doctor for tips on how to do this. General instructions  Take over-the-counter and prescription medicines only as told by your doctor.  Keep all follow-up visits as told by your doctor. This is important. Where to find more information  American Heart Association: www.heart.org Get help right away if:  You have any signs of a stroke. "BE FAST" is an easy way to remember the  main warning signs: ? B - Balance. Signs are dizziness, sudden trouble walking, or loss of balance. ? E - Eyes. Signs are trouble seeing or a change in how you see. ? F - Face. Signs are sudden weakness or loss of feeling of the face, or the face or eyelid drooping on one side. ? A - Arms. Signs are weakness or loss of feeling in an arm. This happens suddenly and usually on one side of the body. ? S - Speech. Signs are sudden trouble speaking, slurred speech, or trouble understanding what people say. ? T - Time. Time to call emergency services. Write down what time symptoms started.  You have other signs of a stroke, such as: ? A sudden, very bad headache with no known cause. ? Feeling like you may vomit (nausea). ? Vomiting. ? A seizure. These symptoms may be an emergency. Do not wait to see if the symptoms will go away. Get medical help right away. Call your local emergency services (911 in the [place] Do not drive yourself to the hospital. Summary  The carotid arteries are blood vessels on both sides of the neck.  If these arteries get smaller or get blocked, you are more likely to have a stroke or a mini-stroke.  This condition can be treated with lifestyle changes, medicines, surgery, or a blend of these treatments.  Get help right away if you have any signs of a stroke. "BE FAST" is an easy way to remember the main warning signs of stroke. This information is not intended to replace advice given to you by your health care provider. Make sure you discuss any questions you have with your health care provider. Document Revised: 11/28/2018 Document Reviewed: 11/28/2018 Elsevier Patient Education  2020 [employer]vier Inc.  

## 2019-11-07 NOTE — Assessment & Plan Note (Signed)
blood glucose control important in reducing the progression of atherosclerotic disease. Also, involved in wound healing. On appropriate medications.  

## 2019-11-07 NOTE — Progress Notes (Signed)
Patient ID: Erica Rivas, female   DOB: 1941/03/08, 79 y.o.   MRN: [id number]  Chief Complaint  Patient presents with  . New Patient (Initial Visit)    ref Fisher asymptomatic carotid stenosis    HPI Erica Rivas is a 79 y.o. female.  I am asked to see the patient by Dr. Caryn Section for evaluation of carotid stenosis.  The patient reports no symptoms of her carotid artery stenosis. Specifically, the patient denies amaurosis fugax, speech or swallowing difficulties, or arm or leg weakness or numbness.  She does have multiple medical comorbidities as listed below and does have ongoing tobacco use.  She does have significant COPD. The patient recently underwent an ultrasound from the hospital which I have independently reviewed.  The velocities would suggest a 70 to 99% stenosis of the right carotid artery with less than 40% left carotid artery stenosis.   Past Medical History:  Diagnosis Date  . Anemia   . Colon polyps   . COPD (chronic obstructive pulmonary disease) ([place])   . 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 Diabetes mellitus without complication ([place])   . Hemorrhoids   . History of chicken pox   . History of measles   . History of mumps   . Lung nodule 02/18/2015   No additional follow up needed per CT report 03/05/2015   . 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 ABDOMINAL HYSTERECTOMY  1985   DUB; ovaries resected/ removed  . BREAST BIOPSY Right 11/12/2014   fibroadenomatous  . CARDIOVASCULAR STRESS TEST  02/2008   low risk  . COLONOSCOPY WITH PROPOFOL N/A 01/18/2018   Procedure: COLONOSCOPY WITH PROPOFOL;  Surgeon: Lin Landsman, MD;  Location: [facility];  Service: Gastroenterology;  Laterality: N/A;  . DOPPLER ECHOCARDIOGRAPHY  01/2010   EF>55%, LV relaxation impairment consistent with diastolic dysfunction, mild TR and MR  . ESOPHAGOGASTRODUODENOSCOPY  07/2009   +diffuse gastritis. Iftikhar    . ESOPHAGOGASTRODUODENOSCOPY (EGD) WITH PROPOFOL N/A 01/18/2018   Procedure: ESOPHAGOGASTRODUODENOSCOPY (EGD) WITH PROPOFOL;  Surgeon: Lin Landsman, MD;  Location: [facility];  Service: Gastroenterology;  Laterality: N/A;  . GALLBLADDER SURGERY  1994  . HAND SURGERY Right 2010   surgery x 2. Dr. Sabra Heck  . NECK SURGERY  1998   Disc surgey on neck  . Sinus Surgery    . TUBAL LIGATION  1971    Family History  Problem Relation Age of Onset  . Hypertension Sister   . COPD Sister   . Thyroid disease Sister   . Breast cancer Sister 62  . Thyroid disease Sister   . Heart attack Mother   . Hypertension Mother   . Thyroid disease Mother        HYPERTHYROIDISM  . GI Bleed Father   . Lung cancer Brother   . Breast cancer Sister   . Hypertension Sister   . Aneurysm Sister        in the back of her neck  . Heart attack Brother        CABG      Social History   Tobacco Use  . Smoking status: Current Every Day Smoker    Packs/day: 0.75    Years: 50.00    Pack years: 37.50    Types: Cigarettes  . Smokeless tobacco: Never Used  Vaping Use  . Vaping Use: Never used  Substance Use Topics  . Alcohol use: No  Alcohol/week: 0.0 standard drinks  . Drug use: No     No Known Allergies  Current Outpatient Medications  Medication Sig Dispense Refill  . albuterol (PROAIR HFA) 108 (90 Base) MCG/ACT inhaler INHALE TWO PUFFS BY MOUTH EVERY 6 HOURS AS NEEDED FOR WHEEZING OR  SHORTNESS  OF  BREATH 18 g 3  . amitriptyline (ELAVIL) 25 MG tablet Take 1 tablet (25 mg total) by mouth at bedtime. 90 tablet 4  . amLODipine (NORVASC) 5 MG tablet Take 1 tablet (5 mg total) by mouth daily. 90 tablet 3  . aspirin EC 81 MG tablet Take 81 mg by mouth daily.    Marland Kitchen atenolol (TENORMIN) 50 MG tablet TAKE 1 TABLET BY MOUTH TWO  TIMES DAILY 180 tablet 3  . atorvastatin (LIPITOR) 80 MG tablet Take 1 tablet (80 mg total) by mouth daily. 90 tablet 3  . Cholecalciferol (VITAMIN D) 2000 UNITS tablet  Take 1,000 Units by mouth daily.     Marland Kitchen escitalopram (LEXAPRO) 10 MG tablet Take 1 tablet (10 mg total) by mouth daily. 90 tablet 4  . furosemide (LASIX) 20 MG tablet Take 1 tablet (20 mg total) by mouth daily as needed (swelling). 10 tablet 4  . glipiZIDE (GLUCOTROL XL) 10 MG 24 hr tablet TAKE 1 TABLET BY MOUTH  DAILY 90 tablet 1  . lisinopril-hydrochlorothiazide (ZESTORETIC) 20-25 MG tablet Take 1 tablet by mouth daily. 180 tablet 3  . metFORMIN (GLUCOPHAGE) 1000 MG tablet TAKE 1 TABLET BY MOUTH  TWICE DAILY 180 tablet 0  . montelukast (SINGULAIR) 10 MG tablet Take 1 tablet (10 mg total) by mouth daily. 90 tablet 4  . omeprazole (PRILOSEC) 40 MG capsule Take 1 capsule (40 mg total) by mouth daily. 90 capsule 4  . OneTouch Delica Lancets 81X MISC USE TO CHECK BLOOD SUGAR DAILY 100 each 3  . ONETOUCH ULTRA test strip USE TO TEST BLOOD SUGAR DAILY 50 each 0  . SYMBICORT 160-4.5 MCG/ACT inhaler Inhale 2 puffs into the lungs 2 (two) times daily. 3 Inhaler 4  . valACYclovir (VALTREX) 1000 MG tablet 2 tablets twice a day for 1 day as needed for cold sores 12 tablet 1   No current facility-administered medications for this visit.      REVIEW OF SYSTEMS (Negative unless checked)  Constitutional: [] Weight loss  [] Fever  [] Chills Cardiac: [] Chest pain   [] Chest pressure   [] Palpitations   [] Shortness of breath when laying flat   [] Shortness of breath at rest   [] Shortness of breath with exertion. Vascular:  [] Pain in legs with walking   [] Pain in legs at rest   [] Pain in legs when laying flat   [] Claudication   [] Pain in feet when walking  [] Pain in feet at rest  [] Pain in feet when laying flat   [] History of DVT   [] Phlebitis   [] Swelling in legs   [] Varicose veins   [] Non-healing ulcers Pulmonary:   [] Uses home oxygen   [] Productive cough   [] Hemoptysis   [] Wheeze  [] COPD   [] Asthma Neurologic:  [] Dizziness  [] Blackouts   [] Seizures   [] History of stroke   [] History of TIA  [] Aphasia   [] Temporary  blindness   [] Dysphagia   [] Weakness or numbness in arms   [] Weakness or numbness in legs Musculoskeletal:  [x] Arthritis   [] Joint swelling   [] Joint pain   [] Low back pain Hematologic:  [] Easy bruising  [] Easy bleeding   [] Hypercoagulable state   [] Anemic  [] Hepatitis Gastrointestinal:  [] Blood in stool   []   Vomiting blood  [x] Gastroesophageal reflux/heartburn   [] Abdominal pain Genitourinary:  [x] Chronic kidney disease   [] Difficult urination  [] Frequent urination  [] Burning with urination   [] Hematuria Skin:  [] Rashes   [] Ulcers   [] Wounds Psychological:  [] History of anxiety   []  History of major depression.    Physical Exam BP (!) 185/72 (BP Location: Left Arm)   Pulse 70   Resp 16   Ht 5\' 7"  (1.702 m)   Wt 161 lb 9.6 oz (73.3 kg)   BMI 25.31 kg/m  Gen:  WD/WN, NAD Head: [place]/AT, No temporalis wasting.  Ear/Nose/Throat: Hearing grossly intact, nares w/o erythema or drainage, oropharynx w/o Erythema/Exudate Eyes: Conjunctiva clear, sclera non-icteric  Neck: trachea midline.  Right carotid bruit is present Pulmonary:  Good air movement, clear to auscultation bilaterally.  Cardiac: RRR, normal S1, S2 Vascular:  Vessel Right Left  Radial Palpable Palpable                                   Gastrointestinal: soft, non-tender/non-distended. Musculoskeletal: M/S 5/5 throughout.  Extremities without ischemic changes.  No deformity or atrophy.  No edema. Neurologic: Sensation grossly intact in extremities.  Symmetrical.  Speech is fluent. Motor exam as listed above. Psychiatric: Judgment intact, Mood & affect appropriate for pt's clinical situation. Dermatologic: No rashes or ulcers noted.  No cellulitis or open wounds. Lymph : No Cervical, Axillary, or Inguinal lymphadenopathy.   Radiology No results found.  Labs Recent Results (from the past 2160 hour(s))  POCT HgB A1C     Status: Abnormal   Collection Time: 09/25/19 11:14 AM  Result Value Ref Range   Hemoglobin A1C 7.0  (A) 4.0 - 5.6 %   Est. average glucose Bld gHb Est-mCnc 154     Assessment/Plan:  Pure hypercholesterolemia lipid control important in reducing the progression of atherosclerotic disease. Continue statin therapy   Essential hypertension blood pressure control important in reducing the progression of atherosclerotic disease. On appropriate oral medications.   Chronic kidney disease (CKD), stage III (moderate) Her chronic kidney disease is fairly advanced with a creatinine clearance in the high 30s.  There is some risk with performing a CT angiogram due to the high amount of contrast in a catheter-based angiogram may offer a better option for evaluating her carotids.  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 Also, involved in wound healing. On appropriate medications.       Leotis Pain 11/07/2019, 1:29 PM   This note was created with Dragon medical transcription system.  Any errors from dictation are unintentional.

## 2019-11-07 NOTE — Assessment & Plan Note (Signed)
blood pressure control important in reducing the progression of atherosclerotic disease. On appropriate oral medications.  

## 2019-11-07 NOTE — Assessment & Plan Note (Signed)
Her chronic kidney disease is fairly advanced with a creatinine clearance in the high 30s.  There is some risk with performing a CT angiogram due to the high amount of contrast in a catheter-based angiogram may offer a better option for evaluating her carotids.

## 2019-11-07 NOTE — Assessment & Plan Note (Signed)
The patient recently underwent an ultrasound from the hospital which I have independently reviewed.  The velocities would suggest a 70 to 99% stenosis of the right carotid artery with less than 40% left carotid artery stenosis.  Although the patient is asymptomatic, repair at this degree of stenosis would generally be indicated to reduce stroke risk reduction.  I had a long discussion with she and her husband today.  Her chronic kidney disease complicates the situation.  In many instances, we get a CT scan of the neck to evaluate for carotid endarterectomy versus carotid stenting.  With her creatinine clearance down, there is more risk for renal dysfunction with a CT scan due to the amount of contrast used.  A catheter-based angiogram can demonstrate the lesions and generally use less than half the contrast that a CT scan was used.  She and her husband also inquire of whether or not we could go ahead and fix the lesion when we perform the angiogram.  If she does have anatomy that is favorable for carotid stent placement, that is certainly a possibility as long as the amount of contrast stays in a reasonable round.  I have discussed the risks and benefits of the procedure.  I discussed both open surgical therapy versus carotid stent placement and the pros and cons of each method.  The patient and her wife voiced their understanding and are agreeable to proceed with carotid angiogram with a possible stent placement at that time.  I would also recommend the addition of Plavix with the possibility that intervention could be performed.  I will send in a prescription for that today.  She will continue her aspirin and statin agent

## 2019-11-07 NOTE — Telephone Encounter (Signed)
Spoke with the patient and she is scheduled with Dr. Lucky Cowboy for a carotid angiogram possible stent placement on 11/13/19 with a 9:15 am arrival time to the [facility]. Patient will do covid testing on 11/09/19 between 8-1 pm at the [facility]. Pre-procedure instructions were discussed and will be mailed.

## 2019-11-07 NOTE — Assessment & Plan Note (Signed)
lipid control important in reducing the progression of atherosclerotic disease. Continue statin therapy  

## 2019-11-09 ENCOUNTER — Other Ambulatory Visit
Admission: RE | Admit: 2019-11-09 | Discharge: 2019-11-09 | Disposition: A | Discharge status: Home / Self Care | Payer: Medicare Other | Source: Ambulatory Visit | Attending: Vascular Surgery | Admitting: Vascular Surgery

## 2019-11-09 ENCOUNTER — Other Ambulatory Visit: Payer: Self-pay

## 2019-11-09 DIAGNOSIS — Z20822 Contact with and (suspected) exposure to covid-19: Secondary | ICD-10-CM | POA: Diagnosis not present

## 2019-11-09 DIAGNOSIS — Z01812 Encounter for preprocedural laboratory examination: Secondary | ICD-10-CM | POA: Diagnosis not present

## 2019-11-09 LAB — SARS CORONAVIRUS 2 (TAT 6-24 HRS): SARS Coronavirus 2: NEGATIVE

## 2019-11-12 ENCOUNTER — Other Ambulatory Visit (INDEPENDENT_AMBULATORY_CARE_PROVIDER_SITE_OTHER): Payer: Self-pay | Admitting: Nurse Practitioner

## 2019-11-13 ENCOUNTER — Inpatient Hospital Stay
Admission: AD | Admit: 2019-11-13 | Discharge: 2019-11-14 | DRG: 036 | Disposition: A | Discharge status: Home / Self Care | Payer: Medicare Other | Attending: Vascular Surgery | Admitting: Vascular Surgery

## 2019-11-13 ENCOUNTER — Other Ambulatory Visit: Payer: Self-pay

## 2019-11-13 ENCOUNTER — Encounter
Admission: AD | Disposition: A | Discharge status: Home / Self Care | Payer: Self-pay | Source: Home / Self Care | Attending: Vascular Surgery

## 2019-11-13 ENCOUNTER — Encounter: Payer: Self-pay | Admitting: Vascular Surgery

## 2019-11-13 DIAGNOSIS — Z7984 Long term (current) use of oral hypoglycemic drugs: Secondary | ICD-10-CM

## 2019-11-13 DIAGNOSIS — E1122 Type 2 diabetes mellitus with diabetic chronic kidney disease: Secondary | ICD-10-CM | POA: Diagnosis not present

## 2019-11-13 DIAGNOSIS — J449 Chronic obstructive pulmonary disease, unspecified: Secondary | ICD-10-CM | POA: Diagnosis present

## 2019-11-13 DIAGNOSIS — Z8249 Family history of ischemic heart disease and other diseases of the circulatory system: Secondary | ICD-10-CM | POA: Diagnosis not present

## 2019-11-13 DIAGNOSIS — N1832 Chronic kidney disease, stage 3b: Secondary | ICD-10-CM | POA: Diagnosis present

## 2019-11-13 DIAGNOSIS — F17210 Nicotine dependence, cigarettes, uncomplicated: Secondary | ICD-10-CM | POA: Diagnosis present

## 2019-11-13 DIAGNOSIS — Z8719 Personal history of other diseases of the digestive system: Secondary | ICD-10-CM

## 2019-11-13 DIAGNOSIS — Z8619 Personal history of other infectious and parasitic diseases: Secondary | ICD-10-CM | POA: Diagnosis not present

## 2019-11-13 DIAGNOSIS — Z85828 Personal history of other malignant neoplasm of skin: Secondary | ICD-10-CM

## 2019-11-13 DIAGNOSIS — Z7982 Long term (current) use of aspirin: Secondary | ICD-10-CM | POA: Diagnosis not present

## 2019-11-13 DIAGNOSIS — I6529 Occlusion and stenosis of unspecified carotid artery: Secondary | ICD-10-CM

## 2019-11-13 DIAGNOSIS — F329 Major depressive disorder, single episode, unspecified: Secondary | ICD-10-CM | POA: Diagnosis present

## 2019-11-13 DIAGNOSIS — Z79899 Other long term (current) drug therapy: Secondary | ICD-10-CM

## 2019-11-13 DIAGNOSIS — I6521 Occlusion and stenosis of right carotid artery: Secondary | ICD-10-CM | POA: Diagnosis not present

## 2019-11-13 HISTORY — PX: CAROTID PTA/STENT INTERVENTION: CATH118231

## 2019-11-13 LAB — GLUCOSE, CAPILLARY
Glucose-Capillary: 108 mg/dL — ABNORMAL HIGH (ref 70–99)
Glucose-Capillary: 67 mg/dL — ABNORMAL LOW (ref 70–99)
Glucose-Capillary: 73 mg/dL (ref 70–99)
Glucose-Capillary: 79 mg/dL (ref 70–99)

## 2019-11-13 LAB — POCT ACTIVATED CLOTTING TIME: Activated Clotting Time: 268 seconds

## 2019-11-13 LAB — MRSA PCR SCREENING: MRSA by PCR: NEGATIVE

## 2019-11-13 LAB — CREATININE, SERUM
Creatinine, Ser: 1.34 mg/dL — ABNORMAL HIGH (ref 0.44–1.00)
GFR calc Af Amer: 44 mL/min — ABNORMAL LOW (ref >60)
GFR calc non Af Amer: 38 mL/min — ABNORMAL LOW (ref >60)

## 2019-11-13 LAB — BUN: BUN: 11 mg/dL (ref 8–23)

## 2019-11-13 SURGERY — CAROTID PTA/STENT INTERVENTION
Anesthesia: Moderate Sedation | Laterality: Right

## 2019-11-13 MED ORDER — LABETALOL HCL 5 MG/ML IV SOLN: 10.0000 mg | INTRAVENOUS | Status: DC | PRN

## 2019-11-13 MED ORDER — CLOPIDOGREL BISULFATE 75 MG PO TABS
75.0000 mg | ORAL_TABLET | Freq: Every day | ORAL | Status: DC
  Administered 2019-11-14: 75 mg via ORAL
  Filled 2019-11-13: qty 1

## 2019-11-13 MED ORDER — ACETAMINOPHEN 325 MG RE SUPP
325.0000 mg | RECTAL | Status: DC | PRN
  Filled 2019-11-13: qty 2

## 2019-11-13 MED ORDER — DIPHENHYDRAMINE HCL 50 MG/ML IJ SOLN: 50.0000 mg | Freq: Once | INTRAMUSCULAR | Status: DC | PRN

## 2019-11-13 MED ORDER — MAGNESIUM SULFATE 2 GM/50ML IV SOLN: 2.0000 g | Freq: Every day | INTRAVENOUS | Status: DC | PRN

## 2019-11-13 MED ORDER — GUAIFENESIN-DM 100-10 MG/5ML PO SYRP: 15.0000 mL | ORAL_SOLUTION | ORAL | Status: DC | PRN

## 2019-11-13 MED ORDER — MIDAZOLAM HCL 2 MG/2ML IJ SOLN
INTRAMUSCULAR | Status: DC | PRN
  Administered 2019-11-13: 1 mg via INTRAVENOUS
  Administered 2019-11-13: 2 mg via INTRAVENOUS

## 2019-11-13 MED ORDER — LISINOPRIL 20 MG PO TABS
20.0000 mg | ORAL_TABLET | Freq: Every day | ORAL | Status: DC
  Administered 2019-11-14: 20 mg via ORAL
  Filled 2019-11-13: qty 1

## 2019-11-13 MED ORDER — HEPARIN SODIUM (PORCINE) 1000 UNIT/ML IJ SOLN
INTRAMUSCULAR | Status: AC
  Filled 2019-11-13: qty 1

## 2019-11-13 MED ORDER — METHYLPREDNISOLONE SODIUM SUCC 125 MG IJ SOLR: 125.0000 mg | Freq: Once | INTRAMUSCULAR | Status: DC | PRN

## 2019-11-13 MED ORDER — OXYCODONE-ACETAMINOPHEN 5-325 MG PO TABS: 1.0000 | ORAL_TABLET | ORAL | Status: DC | PRN

## 2019-11-13 MED ORDER — HYDROCHLOROTHIAZIDE 25 MG PO TABS
25.0000 mg | ORAL_TABLET | Freq: Every day | ORAL | Status: DC
  Administered 2019-11-14: 25 mg via ORAL
  Filled 2019-11-13: qty 1

## 2019-11-13 MED ORDER — MIDAZOLAM HCL 5 MG/5ML IJ SOLN
INTRAMUSCULAR | Status: AC
  Filled 2019-11-13: qty 5

## 2019-11-13 MED ORDER — ATROPINE SULFATE 1 MG/10ML IJ SOSY
PREFILLED_SYRINGE | INTRAMUSCULAR | Status: AC
  Filled 2019-11-13: qty 20

## 2019-11-13 MED ORDER — HYDRALAZINE HCL 20 MG/ML IJ SOLN: 5.0000 mg | INTRAMUSCULAR | Status: DC | PRN

## 2019-11-13 MED ORDER — CEFAZOLIN SODIUM-DEXTROSE 2-4 GM/100ML-% IV SOLN
2.0000 g | Freq: Once | INTRAVENOUS | Status: AC
  Administered 2019-11-13: 2 g via INTRAVENOUS

## 2019-11-13 MED ORDER — DOPAMINE-DEXTROSE 3.2-5 MG/ML-% IV SOLN
INTRAVENOUS | Status: AC
  Filled 2019-11-13: qty 250

## 2019-11-13 MED ORDER — METFORMIN HCL 500 MG PO TABS
1000.0000 mg | ORAL_TABLET | Freq: Two times a day (BID) | ORAL | Status: DC
  Filled 2019-11-13 (×3): qty 2

## 2019-11-13 MED ORDER — FAMOTIDINE 20 MG PO TABS: 40.0000 mg | ORAL_TABLET | Freq: Once | ORAL | Status: DC | PRN

## 2019-11-13 MED ORDER — AMITRIPTYLINE HCL 25 MG PO TABS
25.0000 mg | ORAL_TABLET | Freq: Every day | ORAL | Status: DC
  Administered 2019-11-13: 25 mg via ORAL
  Filled 2019-11-13 (×2): qty 1

## 2019-11-13 MED ORDER — ESCITALOPRAM OXALATE 10 MG PO TABS
10.0000 mg | ORAL_TABLET | Freq: Every day | ORAL | Status: DC
  Administered 2019-11-14: 10 mg via ORAL
  Filled 2019-11-13: qty 1

## 2019-11-13 MED ORDER — FENTANYL CITRATE (PF) 100 MCG/2ML IJ SOLN
INTRAMUSCULAR | Status: AC
  Filled 2019-11-13: qty 2

## 2019-11-13 MED ORDER — ATENOLOL 50 MG PO TABS
50.0000 mg | ORAL_TABLET | Freq: Two times a day (BID) | ORAL | Status: DC
  Administered 2019-11-14: 50 mg via ORAL
  Filled 2019-11-13 (×3): qty 1

## 2019-11-13 MED ORDER — POTASSIUM CHLORIDE CRYS ER 20 MEQ PO TBCR: 20.0000 meq | EXTENDED_RELEASE_TABLET | Freq: Every day | ORAL | Status: DC | PRN

## 2019-11-13 MED ORDER — VITAMIN D 25 MCG (1000 UNIT) PO TABS
1000.0000 [IU] | ORAL_TABLET | Freq: Every day | ORAL | Status: DC
  Administered 2019-11-14: 1000 [IU] via ORAL
  Filled 2019-11-13: qty 1

## 2019-11-13 MED ORDER — AMLODIPINE BESYLATE 5 MG PO TABS
5.0000 mg | ORAL_TABLET | Freq: Every day | ORAL | Status: DC
  Administered 2019-11-14: 5 mg via ORAL
  Filled 2019-11-13: qty 1

## 2019-11-13 MED ORDER — MONTELUKAST SODIUM 10 MG PO TABS
10.0000 mg | ORAL_TABLET | Freq: Every day | ORAL | Status: DC
  Administered 2019-11-14: 10 mg via ORAL
  Filled 2019-11-13: qty 1

## 2019-11-13 MED ORDER — ALUM & MAG HYDROXIDE-SIMETH 200-200-20 MG/5ML PO SUSP: 15.0000 mL | ORAL | Status: DC | PRN

## 2019-11-13 MED ORDER — METOPROLOL TARTRATE 5 MG/5ML IV SOLN: 2.0000 mg | INTRAVENOUS | Status: DC | PRN

## 2019-11-13 MED ORDER — CEFAZOLIN SODIUM-DEXTROSE 2-4 GM/100ML-% IV SOLN
2.0000 g | Freq: Three times a day (TID) | INTRAVENOUS | Status: AC
  Administered 2019-11-13 – 2019-11-14 (×2): 2 g via INTRAVENOUS
  Filled 2019-11-13 (×2): qty 100

## 2019-11-13 MED ORDER — ATORVASTATIN CALCIUM 80 MG PO TABS
80.0000 mg | ORAL_TABLET | Freq: Every day | ORAL | Status: DC
  Administered 2019-11-13 – 2019-11-14 (×2): 80 mg via ORAL
  Filled 2019-11-13: qty 4
  Filled 2019-11-13 (×2): qty 1

## 2019-11-13 MED ORDER — ASPIRIN EC 81 MG PO TBEC
81.0000 mg | DELAYED_RELEASE_TABLET | Freq: Every day | ORAL | Status: DC
  Administered 2019-11-14: 81 mg via ORAL
  Filled 2019-11-13: qty 1

## 2019-11-13 MED ORDER — PHENYLEPHRINE HCL (PRESSORS) 10 MG/ML IV SOLN
INTRAVENOUS | Status: AC
  Filled 2019-11-13: qty 1

## 2019-11-13 MED ORDER — FAMOTIDINE IN NACL 20-0.9 MG/50ML-% IV SOLN
20.0000 mg | Freq: Two times a day (BID) | INTRAVENOUS | Status: DC
  Administered 2019-11-13 – 2019-11-14 (×3): 20 mg via INTRAVENOUS
  Filled 2019-11-13 (×3): qty 50

## 2019-11-13 MED ORDER — PANTOPRAZOLE SODIUM 40 MG PO TBEC
40.0000 mg | DELAYED_RELEASE_TABLET | Freq: Every day | ORAL | Status: DC
  Administered 2019-11-14: 40 mg via ORAL
  Filled 2019-11-13: qty 1

## 2019-11-13 MED ORDER — FUROSEMIDE 20 MG PO TABS: 20.0000 mg | ORAL_TABLET | Freq: Every day | ORAL | Status: DC | PRN

## 2019-11-13 MED ORDER — ACETAMINOPHEN 325 MG PO TABS: 325.0000 mg | ORAL_TABLET | ORAL | Status: DC | PRN

## 2019-11-13 MED ORDER — FENTANYL CITRATE (PF) 100 MCG/2ML IJ SOLN
INTRAMUSCULAR | Status: DC | PRN
  Administered 2019-11-13: 25 ug via INTRAVENOUS
  Administered 2019-11-13: 50 ug via INTRAVENOUS

## 2019-11-13 MED ORDER — ALBUTEROL SULFATE (2.5 MG/3ML) 0.083% IN NEBU: 2.5000 mg | INHALATION_SOLUTION | Freq: Four times a day (QID) | RESPIRATORY_TRACT | Status: DC | PRN

## 2019-11-13 MED ORDER — SODIUM CHLORIDE 0.9 % IV SOLN: INTRAVENOUS | Status: DC

## 2019-11-13 MED ORDER — HYDROMORPHONE HCL 1 MG/ML IJ SOLN: 1.0000 mg | Freq: Once | INTRAMUSCULAR | Status: DC | PRN

## 2019-11-13 MED ORDER — ONDANSETRON HCL 4 MG/2ML IJ SOLN: 4.0000 mg | Freq: Four times a day (QID) | INTRAMUSCULAR | Status: DC | PRN

## 2019-11-13 MED ORDER — ATROPINE SULFATE 1 MG/10ML IJ SOSY
PREFILLED_SYRINGE | INTRAMUSCULAR | Status: DC | PRN
  Administered 2019-11-13: 1 mg via INTRAVENOUS

## 2019-11-13 MED ORDER — MIDAZOLAM HCL 2 MG/ML PO SYRP: 8.0000 mg | ORAL_SOLUTION | Freq: Once | ORAL | Status: DC | PRN

## 2019-11-13 MED ORDER — GLIPIZIDE ER 10 MG PO TB24
10.0000 mg | ORAL_TABLET | Freq: Every day | ORAL | Status: DC
  Administered 2019-11-14: 10 mg via ORAL
  Filled 2019-11-13: qty 1

## 2019-11-13 MED ORDER — PHENOL 1.4 % MT LIQD
1.0000 | OROMUCOSAL | Status: DC | PRN
  Filled 2019-11-13: qty 177

## 2019-11-13 MED ORDER — ALBUTEROL SULFATE (2.5 MG/3ML) 0.083% IN NEBU
2.5000 mg | INHALATION_SOLUTION | Freq: Four times a day (QID) | RESPIRATORY_TRACT | Status: DC
  Administered 2019-11-13 (×2): 2.5 mg via RESPIRATORY_TRACT
  Filled 2019-11-13 (×2): qty 3

## 2019-11-13 MED ORDER — SODIUM CHLORIDE 0.9 % IV SOLN: 500.0000 mL | Freq: Once | INTRAVENOUS | Status: DC | PRN

## 2019-11-13 MED ORDER — HEPARIN SODIUM (PORCINE) 1000 UNIT/ML IJ SOLN
INTRAMUSCULAR | Status: DC | PRN
  Administered 2019-11-13: 6000 [IU] via INTRAVENOUS

## 2019-11-13 MED ORDER — MORPHINE SULFATE (PF) 4 MG/ML IV SOLN: 2.0000 mg | INTRAVENOUS | Status: DC | PRN

## 2019-11-13 MED ORDER — MOMETASONE FURO-FORMOTEROL FUM 200-5 MCG/ACT IN AERO
2.0000 | INHALATION_SPRAY | Freq: Two times a day (BID) | RESPIRATORY_TRACT | Status: DC
  Administered 2019-11-13 – 2019-11-14 (×2): 2 via RESPIRATORY_TRACT
  Filled 2019-11-13: qty 8.8

## 2019-11-13 MED ORDER — IODIXANOL 320 MG/ML IV SOLN
INTRAVENOUS | Status: DC | PRN
  Administered 2019-11-13: 60 mL

## 2019-11-13 MED ORDER — LISINOPRIL-HYDROCHLOROTHIAZIDE 20-25 MG PO TABS: 1.0000 | ORAL_TABLET | Freq: Every day | ORAL | Status: DC

## 2019-11-13 MED ORDER — CHLORHEXIDINE GLUCONATE CLOTH 2 % EX PADS: 6.0000 | MEDICATED_PAD | Freq: Every day | CUTANEOUS | Status: DC

## 2019-11-13 SURGICAL SUPPLY — 19 items
BALLN VIATRAC 4X30X135 (BALLOONS) ×3
BALLN VIATRAC 5X40X135 (BALLOONS) ×3
BALLOON VIATRAC 4X30X135 (BALLOONS) ×1 IMPLANT
BALLOON VIATRAC 5X40X135 (BALLOONS) ×1 IMPLANT
CATH ANGIO 5F 100CM .035 PIG (CATHETERS) ×3 IMPLANT
CATH BEACON 5 .035 100 SIM1 TP (CATHETERS) ×3 IMPLANT
DEVICE EMBOSHIELD NAV6 4.0-7.0 (FILTER) ×3 IMPLANT
DEVICE PRESTO INFLATION (MISCELLANEOUS) ×3 IMPLANT
DEVICE SAFEGUARD 24CM (GAUZE/BANDAGES/DRESSINGS) ×3 IMPLANT
DEVICE STARCLOSE SE CLOSURE (Vascular Products) ×3 IMPLANT
DEVICE TORQUE .025-.038 (MISCELLANEOUS) ×3 IMPLANT
GLIDEWIRE ANGLED SS 035X260CM (WIRE) ×3 IMPLANT
KIT CAROTID MANIFOLD (MISCELLANEOUS) ×3 IMPLANT
PACK ANGIOGRAPHY (CUSTOM PROCEDURE TRAY) ×3 IMPLANT
SHEATH BRITE TIP 5FRX11 (SHEATH) ×3 IMPLANT
SHEATH SHUTTLE 6FRX80 (SHEATH) ×3 IMPLANT
STENT XACT CAR 9-7X40X136 (Permanent Stent) ×3 IMPLANT
WIRE G VAS 035X260 STIFF (WIRE) ×3 IMPLANT
WIRE J 3MM .035X145CM (WIRE) ×3 IMPLANT

## 2019-11-13 NOTE — Progress Notes (Signed)
Re-examined patient's right femoral site and noticed increased bloody drainage at site. PAD removed and pressure held for 20 minutes. Vascular lab staff notified that EPI and lidocaine injection needed to stop bloody oozing. Dan from vascular lab came to bedside at 1500 to administer. New PAD applied. No new oozing present. Receiving ICU RN, Ceasar Lund, made aware at bedside and examined site. Vital signs stable.

## 2019-11-13 NOTE — Op Note (Signed)
OPERATIVE NOTE DATE: 11/13/2019  PROCEDURE: 1.  Ultrasound guidance for vascular access right femoral artery 2.  Placement of a 9 mm proximal, 7 mm distal, 4 cm long exact stent with the use of the NAV-6 embolic protection device in the right carotid artery  PRE-OPERATIVE DIAGNOSIS: 1.  High-grade right carotid artery stenosis. 2.  Chronic kidney disease limiting ability to get CT scan 3.  COPD  POST-OPERATIVE DIAGNOSIS:  Same as above  SURGEON: Leotis Pain, MD  ASSISTANT(S): None  ANESTHESIA: local/MCS  ESTIMATED BLOOD LOSS: 50 cc  CONTRAST: 60 cc  FLUORO TIME: 9.6 minutes  MODERATE CONSCIOUS SEDATION TIME:  Approximately 45 minutes using 3 mg of Versed and 75 mcg of Fentanyl  FINDING(S): 1.   85% irregular carotid artery stenosis  SPECIMEN(S):   none  INDICATIONS:   Patient is a 79 y.o. female who presents with high-grade right carotid artery stenosis by duplex.  She has chronic kidney disease limiting her ability to get a CT angiogram.  The patient has severe COPD and multiple other medical issues and carotid artery stenting was felt to be preferred to endarterectomy for that reason.  Risks and benefits were discussed and informed consent was obtained.   DESCRIPTION: After obtaining full informed written consent, the patient was brought back to the vascular suite and placed supine upon the table.  The patient received IV antibiotics prior to induction. Moderate conscious sedation was administered during a face to face encounter with the patient throughout the procedure with my supervision of the RN administering medicines and monitoring the patients vital signs and mental status throughout from the start of the procedure until the patient was taken to the recovery room.  After obtaining adequate anesthesia, the patient was prepped and draped in the standard fashion.   The right femoral artery was visualized with ultrasound and found to be widely patent. It was then accessed  under direct ultrasound guidance without difficulty with a Seldinger needle. A permanent image was recorded. A J-wire was placed and we then placed a 6 French sheath. The patient was then heparinized and a total of 6000 units of intravenous heparin were given and an ACT was checked to confirm successful anticoagulation. A pigtail catheter was then placed into the ascending aorta. This showed a type III aortic arch with no proximal stenosis in the great vessels. I then selectively cannulated the innominate artery without difficulty with a Simmons 1 catheter and advanced into the mid right common carotid artery.  Cervical and cerebral carotid angiography was then performed. There were no obvious intracranial filling defects with fairly diminished flow in the anterior cerebral artery but good flow in the middle cerebral artery. The carotid bifurcation demonstrated a high-grade irregular ulcerated lesion in the 85% range.  I then advanced into the external carotid artery with a Glidewire and the Simmons 1 catheter and then exchanged for the Amplatz Super Stiff wire. Over the Amplatz Super Stiff wire, a 6 [place] shuttle sheath was placed into the mid common carotid artery. I then used the NAV-6  Embolic protection device and crossed the lesion and parked this in the distal internal carotid artery at the base of the skull.  I then selected a 9 mm proximal, 7 mm distal, 4 cm long exact stent.  This would not initially cross the lesion due to the tight nature of the lesion, so I predilated the lesion with a 4 mm balloon with the embolic protection device in place.  I then remove  the balloon and advanced the stent without difficulty.  This was deployed across the lesion encompassing it in its entirety. A 5 mm diameter by 4 cm length balloon was used to post dilate the stent. Only about a 10-15% residual stenosis was present after angioplasty. Completion angiogram showed normal intracranial filling without new defects. At  this point I elected to terminate the procedure. The sheath was removed and StarClose closure device was deployed in the right femoral artery with excellent hemostatic result. The patient was taken to the recovery room in stable condition having tolerated the procedure well.  COMPLICATIONS: none  CONDITION: stable  Leotis Pain 11/13/2019 12:00 PM   This note was created with Dragon Medical transcription system. Any errors in dictation are purely unintentional.

## 2019-11-13 NOTE — Progress Notes (Signed)
Patient post-op right carotid stent.  Her vitals remain WDL.  She has been advanced to a regular diet.  She refused her metformin and stated physician wanted her to refrain from taking.  I spoke with physician who verified to not administer metformin.  Patient daughter by bedside.  AOx4.  No pain.  NS @ 173ml/hr.  No pain.  No sign of bleeding at PAD site.

## 2019-11-13 NOTE — H&P (Signed)
[facility] History & Physical Update  The patient was interviewed and re-examined.  The patient's previous History and Physical has been reviewed and is unchanged.  There is no change in the plan of care. We plan to proceed with the scheduled procedure.  Leotis Pain, MD  11/13/2019, 9:04 AM

## 2019-11-14 ENCOUNTER — Encounter: Payer: Self-pay | Admitting: Vascular Surgery

## 2019-11-14 DIAGNOSIS — I6529 Occlusion and stenosis of unspecified carotid artery: Secondary | ICD-10-CM

## 2019-11-14 LAB — BASIC METABOLIC PANEL
Anion gap: 8 (ref 5–15)
BUN: 11 mg/dL (ref 8–23)
CO2: 25 mmol/L (ref 22–32)
Calcium: 8.2 mg/dL — ABNORMAL LOW (ref 8.9–10.3)
Chloride: 103 mmol/L (ref 98–111)
Creatinine, Ser: 1.31 mg/dL — ABNORMAL HIGH (ref 0.44–1.00)
GFR calc Af Amer: 45 mL/min — ABNORMAL LOW (ref >60)
GFR calc non Af Amer: 39 mL/min — ABNORMAL LOW (ref >60)
Glucose, Bld: 101 mg/dL — ABNORMAL HIGH (ref 70–99)
Potassium: 3.8 mmol/L (ref 3.5–5.1)
Sodium: 136 mmol/L (ref 135–145)

## 2019-11-14 LAB — CBC
HCT: 27.5 % — ABNORMAL LOW (ref 36.0–46.0)
Hemoglobin: 9 g/dL — ABNORMAL LOW (ref 12.0–15.0)
MCH: 28.2 pg (ref 26.0–34.0)
MCHC: 32.7 g/dL (ref 30.0–36.0)
MCV: 86.2 fL (ref 80.0–100.0)
Platelets: 181 10*3/uL (ref 150–400)
RBC: 3.19 MIL/uL — ABNORMAL LOW (ref 3.87–5.11)
RDW: 14.3 % (ref 11.5–15.5)
WBC: 11.7 10*3/uL — ABNORMAL HIGH (ref 4.0–10.5)
nRBC: 0 % (ref 0.0–0.2)

## 2019-11-14 MED ORDER — ORAL CARE MOUTH RINSE
15.0000 mL | Freq: Two times a day (BID) | OROMUCOSAL | Status: DC
  Administered 2019-11-14: 15 mL via OROMUCOSAL

## 2019-11-14 MED ORDER — OXYCODONE-ACETAMINOPHEN 5-325 MG PO TABS: 1.0000 | ORAL_TABLET | Freq: Four times a day (QID) | ORAL | Status: DC | PRN

## 2019-11-14 NOTE — Progress Notes (Signed)
PAD dressing deflated to 0 mL at 1300. No bleeding noted. Pt vs wnl. Pt has no symptoms. Pt able to walk down hall over 20 feet without issue. Discussed discharged instructions with patient and husband.

## 2019-11-14 NOTE — Discharge Instructions (Signed)
Vascular Surgery Discharges Instructions:  1) You may shower as of tomorrow.  Gently clean your groins with soap and water.  Gently pat dry. 2) No heavy lifting greater than 10 pounds for at least 3 weeks. 3) No engaging in generous activity for at least 3 weeks.

## 2019-11-14 NOTE — Discharge Summary (Signed)
[facility]    Discharge Summary  Patient ID:  Erica Rivas MRN: [id number] DOB/AGE: 1941-04-24 79 y.o.  Admit date: 11/13/2019 Discharge date: 11/14/2019 Date of Surgery: 11/13/2019 Surgeon: Surgeon(s): Algernon Huxley, MD  Admission Diagnosis: Carotid stenosis, right [I65.21]  Discharge Diagnoses:  Carotid stenosis, right [I65.21]  Secondary Diagnoses: Past Medical History:  Diagnosis Date  . Anemia   . Colon polyps   . COPD (chronic obstructive pulmonary disease) ([place])   . 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 Diabetes mellitus without complication ([place])   . Hemorrhoids   . History of chicken pox   . History of measles   . History of mumps   . Lung nodule 02/18/2015   No additional follow up needed per CT report 03/05/2015   . 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  Ultrasound guidance for vascular access right femoral artery 2.  Placement of a 9 mm proximal, 7 mm distal, 4 cm long exact stent with the use of the NAV-6 embolic protection device in the right carotid artery  Discharged Condition: Good  HPI / Hospital Course:  Erica Rivas is a 79 year old female who presents with critical right artery carotid stenosis. The patient reports no symptoms of her carotid artery stenosis. Specifically, the patient denies amaurosis fugax, speech or swallowing difficulties, or arm or leg weakness or numbness.  She does have multiple medical comorbidities as listed below and does have ongoing tobacco use. She does have significant COPD. The patient recently underwent an ultrasound from the hospital which I have independently reviewed.  The velocities would suggest a 70 to 99% stenosis of the right carotid artery with less than 40% left carotid artery stenosis.  On 11/13/19, the patient underwent:  1.  Ultrasound guidance for vascular access right femoral artery 2.  Placement of a 9 mm proximal, 7 mm distal, 4 cm long exact stent  with the use of the NAV-6 embolic protection device in the right carotid artery She taught the procedure well and was transferred from the recovery room to the ICU for observation overnight.  Patient's night of procedure was unremarkable.  During the patient's brief inpatient stay, her diet was advanced, she was urinating independently, her pain was controlled with the use of p.o. pain medication she was ambulating at baseline.  Day of discharge, the patient was afebrile with stable vital signs making adequate urine.  Physical exam: Alert and oriented x3, no acute distress Face: Symmetrical, tongue midline Neck: Trachea midline, no swelling or ecchymosis Cardiovascular: Regular rate and rhythm Pulmonary: Clear to auscultation bilaterally Abdomen: Soft, nondistended, nontender tender, positive bowel sounds Extremities: warm distally toes Neuro: intact, no deficits  Labs: As below  Complications: None  Consults: None  Significant Diagnostic Studies: CBC Lab Results  Component Value Date   WBC 11.7 (H) 11/14/2019   HGB 9.0 (L) 11/14/2019   HCT 27.5 (L) 11/14/2019   MCV 86.2 11/14/2019   PLT 181 11/14/2019   BMET    Component Value Date/Time   NA 136 11/14/2019 0419   NA 137 06/26/2019 0929   K 3.8 11/14/2019 0419   CL 103 11/14/2019 0419   CO2 25 11/14/2019 0419   GLUCOSE 101 (H) 11/14/2019 0419   BUN 11 11/14/2019 0419   BUN 21 06/26/2019 0929   CREATININE 1.31 (H) 11/14/2019 0419   CALCIUM 8.2 (L) 11/14/2019 0419   GFRNONAA 39 (L) 11/14/2019 0419   GFRAA 45 (L) 11/14/2019 0419   COAG  No results found for: INR, PROTIME  Disposition:  Discharge GX:[id number]  Allergies as of 11/14/2019   No Known Allergies     Medication List    TAKE these medications   albuterol 108 (90 Base) MCG/ACT inhaler Commonly known as: ProAir HFA INHALE TWO PUFFS BY MOUTH EVERY 6 HOURS AS NEEDED FOR WHEEZING OR  SHORTNESS  OF  BREATH   amitriptyline 25 MG tablet Commonly known as:  ELAVIL Take 1 tablet (25 mg total) by mouth at bedtime.   amLODipine 5 MG tablet Commonly known as: NORVASC Take 1 tablet (5 mg total) by mouth daily.   aspirin EC 81 MG tablet Take 81 mg by mouth daily.   atenolol 50 MG tablet Commonly known as: TENORMIN TAKE 1 TABLET BY MOUTH TWO  TIMES DAILY   atorvastatin 80 MG tablet Commonly known as: LIPITOR Take 1 tablet (80 mg total) by mouth daily.   clopidogrel 75 MG tablet Commonly known as: PLAVIX Take 1 tablet (75 mg total) by mouth daily.   escitalopram 10 MG tablet Commonly known as: LEXAPRO Take 1 tablet (10 mg total) by mouth daily.   furosemide 20 MG tablet Commonly known as: Lasix Take 1 tablet (20 mg total) by mouth daily as needed (swelling).   glipiZIDE 10 MG 24 hr tablet Commonly known as: GLUCOTROL XL TAKE 1 TABLET BY MOUTH  DAILY   lisinopril-hydrochlorothiazide 20-25 MG tablet Commonly known as: ZESTORETIC Take 1 tablet by mouth daily.   metFORMIN 1000 MG tablet Commonly known as: GLUCOPHAGE TAKE 1 TABLET BY MOUTH  TWICE DAILY   montelukast 10 MG tablet Commonly known as: SINGULAIR Take 1 tablet (10 mg total) by mouth daily.   omeprazole 40 MG capsule Commonly known as: PRILOSEC Take 1 capsule (40 mg total) by mouth daily.   OneTouch Delica Lancets 41D Misc USE TO CHECK BLOOD SUGAR DAILY   OneTouch Ultra test strip Generic drug: glucose blood USE TO TEST BLOOD SUGAR DAILY   Symbicort 160-4.5 MCG/ACT inhaler Generic drug: budesonide-formoterol Inhale 2 puffs into the lungs 2 (two) times daily.   valACYclovir 1000 MG tablet Commonly known as: VALTREX 2 tablets twice a day for 1 day as needed for cold sores   Vitamin D 50 MCG (2000 UT) tablet Take 1,000 Units by mouth daily.      Verbal and written Discharge instructions given to the patient. Wound care per Discharge AVS  Follow-up Information    Dew, Erskine Squibb, MD Follow up in 1 month(s).   Specialties: Vascular Surgery, Radiology,  Interventional Cardiology Why: Can see Dew or Arna Medici. Will need carotid with visit. First post-op. Contact information: [place] [address] [phone]              Signed: Sela Hua, PA-C  11/14/2019, 10:49 AM

## 2019-12-10 ENCOUNTER — Other Ambulatory Visit: Payer: Self-pay | Admitting: Family Medicine

## 2019-12-10 DIAGNOSIS — I10 Essential (primary) hypertension: Secondary | ICD-10-CM

## 2019-12-10 NOTE — Telephone Encounter (Signed)
Requested Prescriptions  Pending Prescriptions Disp Refills  . metFORMIN (GLUCOPHAGE) 1000 MG tablet [Pharmacy Med Name: MetFORMIN 1000MG TABLET] 180 tablet 3    Sig: TAKE 1 TABLET BY MOUTH  TWICE DAILY     Endocrinology:  Diabetes - Biguanides Failed - 12/10/2019  4:58 AM      Failed - Cr in normal range and within 360 days    Creatinine, Ser  Date Value Ref Range Status  11/14/2019 1.31 (H) 0.44 - 1.00 mg/dL Final   Creatinine, POC  Date Value Ref Range Status  07/13/2016 n/a mg/dL Final         Failed - eGFR in normal range and within 360 days    GFR calc Af Amer  Date Value Ref Range Status  11/14/2019 45 (L) >60 mL/min Final   GFR calc non Af Amer  Date Value Ref Range Status  11/14/2019 39 (L) >60 mL/min Final         Passed - HBA1C is between 0 and 7.9 and within 180 days    Hemoglobin A1C  Date Value Ref Range Status  09/25/2019 7.0 (A) 4.0 - 5.6 % Final   Hgb A1c MFr Bld  Date Value Ref Range Status  06/26/2019 7.0 (H) 4.8 - 5.6 % Final    Comment:             Prediabetes: 5.7 - 6.4          Diabetes: >6.4          Glycemic control for adults with diabetes: <7.0          Passed - Valid encounter within last 6 months    Recent Outpatient Visits          2 months ago Type 2 diabetes mellitus with diabetic neuropathy, without long-term current use of insulin ([place])   [facility] Birdie Sons, MD   5 months ago Type 2 diabetes mellitus with diabetic neuropathy, without long-term current use of insulin ([place])   [facility] Birdie Sons, MD   10 months ago Type 2 diabetes mellitus with diabetic neuropathy, without long-term current use of insulin ([place])   [facility] Birdie Sons, MD   1 year ago Essential hypertension   [facility] Birdie Sons, MD   1 year ago Essential hypertension   [facility] Birdie Sons, MD      Future Appointments            In 1 month  Fisher, Kirstie Peri, MD [facility]           . atenolol (TENORMIN) 50 MG tablet [Pharmacy Med Name: ATENOLOL  50MG  TAB] 180 tablet 3    Sig: TAKE 1 TABLET BY MOUTH  TWICE DAILY     Cardiovascular:  Beta Blockers Failed - 12/10/2019  4:58 AM      Failed - Last Heart Rate in normal range    Pulse Readings from Last 1 Encounters:  11/14/19 (!) 138         Passed - Last BP in normal range    BP Readings from Last 1 Encounters:  11/14/19 (!) 97/52         Passed - Valid encounter within last 6 months    Recent Outpatient Visits          2 months ago Type 2 diabetes mellitus with diabetic neuropathy, without long-term current use of insulin ([place])   [facility] Birdie Sons, MD   5 months ago Type 2 diabetes mellitus with diabetic neuropathy, without long-term current use of insulin [facility])   [facility] Birdie Sons, MD   10 months ago Type 2 diabetes mellitus with diabetic neuropathy, without long-term current use of insulin [facility])   [facility] Birdie Sons, MD   1 year ago Essential hypertension   [facility] Birdie Sons, MD   1 year ago Essential hypertension   [facility] Birdie Sons, MD      Future Appointments            In 1 month Fisher, Kirstie Peri, MD [facility], [facility]           . glipiZIDE (GLUCOTROL XL) 10 MG 24 hr tablet [Pharmacy Med Name: GlipiZIDE XL TAB 10MG] 90 tablet 3    Sig: TAKE 1 TABLET BY MOUTH  DAILY     Endocrinology:  Diabetes - Sulfonylureas Passed - 12/10/2019  4:58 AM      Passed - HBA1C is between 0 and 7.9 and within 180 days    Hemoglobin A1C  Date Value Ref Range Status  09/25/2019 7.0 (A) 4.0 - 5.6 % Final   Hgb A1c MFr Bld  Date Value Ref Range Status  06/26/2019 7.0 (H) 4.8 - 5.6 % Final    Comment:             Prediabetes: 5.7 - 6.4          Diabetes: >6.4          Glycemic control for adults with  diabetes: <7.0          Passed - Valid encounter within last 6 months    Recent Outpatient Visits          2 months ago Type 2 diabetes mellitus with diabetic neuropathy, without long-term current use of insulin ([place])   [facility] Birdie Sons, MD   5 months ago Type 2 diabetes mellitus with diabetic neuropathy, without long-term current use of insulin ([place])   [facility] Birdie Sons, MD   10 months ago Type 2 diabetes mellitus with diabetic neuropathy, without long-term current use of insulin [facility])   [facility] Birdie Sons, MD   1 year ago Essential hypertension   [facility] Birdie Sons, MD   1 year ago Essential hypertension   [facility] Birdie Sons, MD      Future Appointments            In 1 month Caryn Section, Kirstie Peri, MD [facility], [facility]           HR on 11/14/2019 on protocol elevated, which was the time of discharged from the ICU.  For the 3 previous hours prior to discharged patient's HR was 66, 71, and 79.  The elevated HR on the protocol may be movement or artifact while the patient was preparing to be discharged.

## 2019-12-13 ENCOUNTER — Other Ambulatory Visit (INDEPENDENT_AMBULATORY_CARE_PROVIDER_SITE_OTHER): Payer: Self-pay | Admitting: Vascular Surgery

## 2019-12-13 DIAGNOSIS — I6529 Occlusion and stenosis of unspecified carotid artery: Secondary | ICD-10-CM

## 2019-12-14 ENCOUNTER — Ambulatory Visit (INDEPENDENT_AMBULATORY_CARE_PROVIDER_SITE_OTHER): Payer: Medicare Other | Admitting: Nurse Practitioner

## 2019-12-14 ENCOUNTER — Encounter (INDEPENDENT_AMBULATORY_CARE_PROVIDER_SITE_OTHER): Payer: Self-pay | Admitting: Nurse Practitioner

## 2019-12-14 ENCOUNTER — Other Ambulatory Visit: Payer: Self-pay

## 2019-12-14 ENCOUNTER — Ambulatory Visit (INDEPENDENT_AMBULATORY_CARE_PROVIDER_SITE_OTHER): Payer: Medicare Other

## 2019-12-14 VITALS — BP 111/63 | HR 70 | Resp 16 | Ht 67.0 in | Wt 164.0 lb

## 2019-12-14 DIAGNOSIS — I6521 Occlusion and stenosis of right carotid artery: Secondary | ICD-10-CM

## 2019-12-14 DIAGNOSIS — E7800 Pure hypercholesterolemia, unspecified: Secondary | ICD-10-CM

## 2019-12-14 DIAGNOSIS — I6529 Occlusion and stenosis of unspecified carotid artery: Secondary | ICD-10-CM | POA: Diagnosis not present

## 2019-12-14 DIAGNOSIS — I10 Essential (primary) hypertension: Secondary | ICD-10-CM

## 2019-12-17 ENCOUNTER — Other Ambulatory Visit: Payer: Self-pay | Admitting: Family Medicine

## 2019-12-17 NOTE — Telephone Encounter (Signed)
Requested Prescriptions  Pending Prescriptions Disp Refills   amitriptyline (ELAVIL) 25 MG tablet [Pharmacy Med Name: AMITRIPTYLINE  25MG   TAB] 90 tablet 1    Sig: TAKE 1 TABLET BY MOUTH AT  BEDTIME     Psychiatry:  Antidepressants - Heterocyclics (TCAs) Passed - 12/17/2019  6:05 PM      Passed - Completed PHQ-2 or PHQ-9 in the last 360 days.      Passed - Valid encounter within last 6 months    Recent Outpatient Visits          2 months ago Type 2 diabetes mellitus with diabetic neuropathy, without long-term current use of insulin ([place])   [facility] Birdie Sons, MD   5 months ago Type 2 diabetes mellitus with diabetic neuropathy, without long-term current use of insulin [facility])   [facility] Birdie Sons, MD   10 months ago Type 2 diabetes mellitus with diabetic neuropathy, without long-term current use of insulin [facility])   [facility] Birdie Sons, MD   1 year ago Essential hypertension   [facility] Birdie Sons, MD   1 year ago Essential hypertension   [facility] Birdie Sons, MD      Future Appointments            In 1 month Fisher, Kirstie Peri, MD [facility] [place]

## 2019-12-18 ENCOUNTER — Encounter (INDEPENDENT_AMBULATORY_CARE_PROVIDER_SITE_OTHER): Payer: Self-pay | Admitting: Nurse Practitioner

## 2019-12-18 NOTE — Progress Notes (Signed)
Subjective:    Patient ID: UVA RUNKEL, female    DOB: 1941/04/02, 79 y.o.   MRN: [id number] Chief Complaint  Patient presents with  . Follow-up    The patient is seen for follow up evaluation of carotid stenosis status post right carotid stent on 11/13/2019.  There were no post operative problems or complications related to the surgery.  The patient denies neck or incisional pain.  The patient denies interval amaurosis fugax. There is no recent history of TIA symptoms or focal motor deficits. There is no prior documented CVA.  The patient denies headache.  The patient is taking enteric-coated aspirin 81 mg daily.  The patient has a history of coronary artery disease, no recent episodes of angina or shortness of breath. The patient denies PAD or claudication symptoms.  There is a history of hyperlipidemia which is being treated with a statin.   The patient has evidence of a patent right internal carotid artery stent.  There is 1 to 39% stenosis in the bilateral internal carotid arteries.   Review of Systems  Hematological: Bruises/bleeds easily.  All other systems reviewed and are negative.      Objective:   Physical Exam Vitals reviewed.  HENT:     Head: Normocephalic.  Cardiovascular:     Rate and Rhythm: Normal rate and regular rhythm.     Pulses: Normal pulses.     Heart sounds: Normal heart sounds.  Pulmonary:     Effort: Pulmonary effort is normal.  Neurological:     Mental Status: She is alert and oriented to person, place, and time.  Psychiatric:        Mood and Affect: Mood normal.        Behavior: Behavior normal.        Thought Content: Thought content normal.        Judgment: Judgment normal.     BP (!) 111/63 (BP Location: Right Arm)   Pulse 70   Resp 16   Ht 5\' 7"  (1.702 m)   Wt 164 lb (74.4 kg)   BMI 25.69 kg/m   Past Medical History:  Diagnosis Date  . Anemia   . Colon polyps   . COPD (chronic obstructive pulmonary disease) ([place])   .  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 Diabetes mellitus without complication ([place])   . Hemorrhoids   . History of chicken pox   . History of measles   . History of mumps   . Lung nodule 02/18/2015   No additional follow up needed per CT report 03/05/2015   . 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 Marital status: Married    Spouse name: Not on file  . Number of children: 2  . Years of education: Not on file  . Highest education level: Not on file  Occupational History  . Occupation: Retired    Comment: Formerly worked at [employer]. Retired in 2007  Tobacco Use  . Smoking status: Current Every Day Smoker    Packs/day: 0.75    Years: 50.00    Pack years: 37.50    Types: Cigarettes  . Smokeless tobacco: Never Used  Vaping Use  . Vaping Use: Never used  Substance and Sexual Activity  . Alcohol use: No    Alcohol/week: 0.0 standard drinks  . Drug use: No  . Sexual activity: Not on file  Other Topics Concern  . Not on file  Social History  Narrative  . Not on file   Social Determinants of Health   Financial Resource Strain:   . Difficulty of Paying Living Expenses:   Food Insecurity:   . Worried About Charity fundraiser in the Last Year:   . Arboriculturist in the Last Year:   Transportation Needs:   . Film/video editor (Medical):   Marland Kitchen Lack of Transportation (Non-Medical):   Physical Activity:   . Days of Exercise per Week:   . Minutes of Exercise per Session:   Stress:   . Feeling of Stress :   Social Connections:   . Frequency of Communication with Friends and Family:   . Frequency of Social Gatherings with Friends and Family:   . Attends Religious Services:   . Active Member of Clubs or Organizations:   . Attends Archivist Meetings:   Marland Kitchen Marital Status:   Intimate Partner Violence:   . Fear of Current or Ex-Partner:   . Emotionally Abused:   Marland Kitchen Physically Abused:   . Sexually Abused:       Past Surgical History:  Procedure Laterality Date  . ABDOMINAL HYSTERECTOMY  1985   DUB; ovaries resected/ removed  . BREAST BIOPSY Right 11/12/2014   fibroadenomatous  . CARDIOVASCULAR STRESS TEST  02/2008   low risk  . CAROTID PTA/STENT INTERVENTION Right 11/13/2019   Procedure: CAROTID PTA/STENT INTERVENTION;  Surgeon: Algernon Huxley, MD;  Location: [place] [facility];  Service: Cardiovascular;  Laterality: Right;  . COLONOSCOPY WITH PROPOFOL N/A 01/18/2018   Procedure: COLONOSCOPY WITH PROPOFOL;  Surgeon: Lin Landsman, MD;  Location: [facility] ENDOSCOPY;  Service: Gastroenterology;  Laterality: N/A;  . DOPPLER ECHOCARDIOGRAPHY  01/2010   EF>55%, LV relaxation impairment consistent with diastolic dysfunction, mild TR and MR  . ESOPHAGOGASTRODUODENOSCOPY  07/2009   +diffuse gastritis. Iftikhar  . ESOPHAGOGASTRODUODENOSCOPY (EGD) WITH PROPOFOL N/A 01/18/2018   Procedure: ESOPHAGOGASTRODUODENOSCOPY (EGD) WITH PROPOFOL;  Surgeon: Lin Landsman, MD;  Location: [place];  Service: Gastroenterology;  Laterality: N/A;  . GALLBLADDER SURGERY  1994  . HAND SURGERY Right 2010   surgery x 2. Dr. Sabra Heck  . NECK SURGERY  1998   Disc surgey on neck  . Sinus Surgery    . TUBAL LIGATION  1971    Family History  Problem Relation Age of Onset  . Hypertension Sister   . COPD Sister   . Thyroid disease Sister   . Breast cancer Sister 63  . Thyroid disease Sister   . Heart attack Mother   . Hypertension Mother   . Thyroid disease Mother        HYPERTHYROIDISM  . GI Bleed Father   . Lung cancer Brother   . Breast cancer Sister   . Hypertension Sister   . Aneurysm Sister        in the back of her neck  . Heart attack Brother        CABG    No Known Allergies     Assessment & Plan:   1. Carotid stenosis, right Recommend:  The patient is s/p successful right carotid stent  Duplex ultrasound preoperatively shows 1-39% contralateral stenosis.  Continue antiplatelet  therapy as prescribed Continue management of CAD, HTN and Hyperlipidemia Healthy heart diet,  encouraged exercise at least 4 times per week  Follow up in 3 months with duplex ultrasound and physical exam based on the patient's carotid surgery.    2. Essential hypertension Continue antihypertensive medications as already ordered,  these medications have been reviewed and there are no changes at this time.   3. Pure hypercholesterolemia Continue statin as ordered and reviewed, no changes at this time    Current Outpatient Medications on File Prior to Visit  Medication Sig Dispense Refill  . albuterol (PROAIR HFA) 108 (90 Base) MCG/ACT inhaler INHALE TWO PUFFS BY MOUTH EVERY 6 HOURS AS NEEDED FOR WHEEZING OR  SHORTNESS  OF  BREATH 18 g 3  . amLODipine (NORVASC) 5 MG tablet Take 1 tablet (5 mg total) by mouth daily. 90 tablet 3  . aspirin EC 81 MG tablet Take 81 mg by mouth daily.    Marland Kitchen atenolol (TENORMIN) 50 MG tablet TAKE 1 TABLET BY MOUTH  TWICE DAILY 180 tablet 1  . atorvastatin (LIPITOR) 80 MG tablet Take 1 tablet (80 mg total) by mouth daily. 90 tablet 3  . Cholecalciferol (VITAMIN D) 2000 UNITS tablet Take 1,000 Units by mouth daily.     . clopidogrel (PLAVIX) 75 MG tablet Take 1 tablet (75 mg total) by mouth daily. 30 tablet 6  . escitalopram (LEXAPRO) 10 MG tablet Take 1 tablet (10 mg total) by mouth daily. 90 tablet 4  . furosemide (LASIX) 20 MG tablet Take 1 tablet (20 mg total) by mouth daily as needed (swelling). 10 tablet 4  . glipiZIDE (GLUCOTROL XL) 10 MG 24 hr tablet TAKE 1 TABLET BY MOUTH  DAILY 90 tablet 1  . lisinopril-hydrochlorothiazide (ZESTORETIC) 20-25 MG tablet Take 1 tablet by mouth daily. 180 tablet 3  . metFORMIN (GLUCOPHAGE) 1000 MG tablet TAKE 1 TABLET BY MOUTH  TWICE DAILY 180 tablet 1  . montelukast (SINGULAIR) 10 MG tablet Take 1 tablet (10 mg total) by mouth daily. 90 tablet 4  . omeprazole (PRILOSEC) 40 MG capsule Take 1 capsule (40 mg total) by mouth  daily. 90 capsule 4  . OneTouch Delica Lancets 16X MISC USE TO CHECK BLOOD SUGAR DAILY 100 each 3  . ONETOUCH ULTRA test strip USE TO TEST BLOOD SUGAR DAILY 50 each 0  . SYMBICORT 160-4.5 MCG/ACT inhaler Inhale 2 puffs into the lungs 2 (two) times daily. 3 Inhaler 4  . valACYclovir (VALTREX) 1000 MG tablet 2 tablets twice a day for 1 day as needed for cold sores 12 tablet 1   No current facility-administered medications on file prior to visit.    There are no Patient Instructions on file for this visit. No follow-ups on file.   Kris Hartmann, NP

## 2020-01-10 ENCOUNTER — Other Ambulatory Visit: Payer: Self-pay

## 2020-01-10 ENCOUNTER — Encounter (INDEPENDENT_AMBULATORY_CARE_PROVIDER_SITE_OTHER): Payer: Medicare Other | Admitting: Ophthalmology

## 2020-01-10 DIAGNOSIS — H43813 Vitreous degeneration, bilateral: Secondary | ICD-10-CM | POA: Diagnosis not present

## 2020-01-10 DIAGNOSIS — H35033 Hypertensive retinopathy, bilateral: Secondary | ICD-10-CM

## 2020-01-10 DIAGNOSIS — H35373 Puckering of macula, bilateral: Secondary | ICD-10-CM | POA: Diagnosis not present

## 2020-01-10 DIAGNOSIS — H35073 Retinal telangiectasis, bilateral: Secondary | ICD-10-CM | POA: Diagnosis not present

## 2020-01-10 DIAGNOSIS — I10 Essential (primary) hypertension: Secondary | ICD-10-CM

## 2020-01-25 NOTE — Progress Notes (Signed)
Trena Platt Cummings,acting as a scribe for Lelon Huh, MD.,have documented all relevant documentation on the behalf of Lelon Huh, MD,as directed by  Lelon Huh, MD while in the presence of Lelon Huh, MD.  Established patient visit   Patient: Erica Rivas   DOB: November 07, 1940   79 y.o. Female  MRN: [id number] Visit Date: 01/26/2020  Today's healthcare provider: Lelon Huh, MD   Chief Complaint  Patient presents with  . Diabetes  . Hyperlipidemia  . Hypertension   Subjective    HPI    Diabetes Mellitus Type II, Follow-up  Lab Results  Component Value Date   HGBA1C 7.0 (A) 09/25/2019   HGBA1C 7.0 (H) 06/26/2019   HGBA1C 6.6 (A) 01/25/2019   Wt Readings from Last 3 Encounters:  01/26/20 167 lb 3.2 oz (75.8 kg)  12/14/19 164 lb (74.4 kg)  11/13/19 161 lb 13.1 oz (73.4 kg)   Last seen for diabetes 4 months ago.  Management since then includes no changes. She reports excellent compliance with treatment. She is not having side effects.    Home blood sugar records: fasting range: 120  Episodes of hypoglycemia? No    Current insulin regiment: none Most Recent Eye Exam: not UTD Current exercise: none Current diet habits: in general, a "healthy" diet    Pertinent Labs: Lab Results  Component Value Date   CHOL 152 06/26/2019   HDL 62 06/26/2019   LDLCALC 75 06/26/2019   TRIG 81 06/26/2019   CHOLHDL 2.5 06/26/2019   Lab Results  Component Value Date   NA 136 11/14/2019   K 3.8 11/14/2019   CREATININE 1.31 (H) 11/14/2019   GFRNONAA 39 (L) 11/14/2019   GFRAA 45 (L) 11/14/2019   GLUCOSE 101 (H) 11/14/2019     --------------------------------------------------------------------------------------------------- Hypertension, follow-up  BP Readings from Last 3 Encounters:  01/26/20 124/70  12/14/19 (!) 111/63  11/14/19 (!) 97/52   Wt Readings from Last 3 Encounters:  01/26/20 167 lb 3.2 oz (75.8 kg)  12/14/19 164 lb (74.4 kg)  11/13/19 161 lb  13.1 oz (73.4 kg)     She was last seen for hypertension 4 months ago.  BP at that visit was 150/72. Management since that visit includes Increase amlodipine from 2.5 to 5mg  daily .  She reports excellent compliance with treatment. She is not having side effects.  She is following a Regular diet. She is not exercising. She does smoke.  Use of agents associated with hypertension: none.   Outside blood pressures are checked occasionally.  Pertinent labs: Lab Results  Component Value Date   CHOL 152 06/26/2019   HDL 62 06/26/2019   LDLCALC 75 06/26/2019   TRIG 81 06/26/2019   CHOLHDL 2.5 06/26/2019   Lab Results  Component Value Date   NA 136 11/14/2019   K 3.8 11/14/2019   CREATININE 1.31 (H) 11/14/2019   GFRNONAA 39 (L) 11/14/2019   GFRAA 45 (L) 11/14/2019   GLUCOSE 101 (H) 11/14/2019     The 10-year ASCVD risk score Mikey Bussing DC Jr., et al., 2013) is: 53.4%   --------------------------------------------------------------------------------------------------- Lipid/Cholesterol, Follow-up  Last lipid panel Other pertinent labs  Lab Results  Component Value Date   CHOL 152 06/26/2019   HDL 62 06/26/2019   LDLCALC 75 06/26/2019   TRIG 81 06/26/2019   CHOLHDL 2.5 06/26/2019   Lab Results  Component Value Date   ALT 14 06/26/2019   AST 17 06/26/2019   PLT 181 11/14/2019   TSH 1.630 06/26/2019  She was last seen for this 4 months ago.  Management since that visit includes no changes.  She reports excellent compliance with treatment. She is not having side effects.    Current diet: in general, a "healthy" diet   Current exercise: none  The 10-year ASCVD risk score Mikey Bussing DC Brooke Bonito., et al., 2013) is: 53.4%  ---------------------------------------------------------------------------------------------------  Social History   Tobacco Use  . Smoking status: Current Every Day Smoker    Packs/day: 0.75    Years: 50.00    Pack years: 37.50    Types: Cigarettes    . Smokeless tobacco: Never Used  Vaping Use  . Vaping Use: Never used  Substance Use Topics  . Alcohol use: No    Alcohol/week: 0.0 standard drinks  . Drug use: No       Medications: Outpatient Medications Prior to Visit  Medication Sig  . albuterol (PROAIR HFA) 108 (90 Base) MCG/ACT inhaler INHALE TWO PUFFS BY MOUTH EVERY 6 HOURS AS NEEDED FOR WHEEZING OR  SHORTNESS  OF  BREATH  . amitriptyline (ELAVIL) 25 MG tablet TAKE 1 TABLET BY MOUTH AT  BEDTIME  . amLODipine (NORVASC) 5 MG tablet Take 1 tablet (5 mg total) by mouth daily.  Marland Kitchen aspirin EC 81 MG tablet Take 81 mg by mouth daily.  Marland Kitchen atenolol (TENORMIN) 50 MG tablet TAKE 1 TABLET BY MOUTH  TWICE DAILY  . atorvastatin (LIPITOR) 80 MG tablet Take 1 tablet (80 mg total) by mouth daily.  . Cholecalciferol (VITAMIN D) 2000 UNITS tablet Take 1,000 Units by mouth daily.   . clopidogrel (PLAVIX) 75 MG tablet Take 1 tablet (75 mg total) by mouth daily.  Marland Kitchen escitalopram (LEXAPRO) 10 MG tablet Take 1 tablet (10 mg total) by mouth daily.  . furosemide (LASIX) 20 MG tablet Take 1 tablet (20 mg total) by mouth daily as needed (swelling).  Marland Kitchen glipiZIDE (GLUCOTROL XL) 10 MG 24 hr tablet TAKE 1 TABLET BY MOUTH  DAILY  . lisinopril-hydrochlorothiazide (ZESTORETIC) 20-25 MG tablet Take 1 tablet by mouth daily.  . metFORMIN (GLUCOPHAGE) 1000 MG tablet TAKE 1 TABLET BY MOUTH  TWICE DAILY  . montelukast (SINGULAIR) 10 MG tablet Take 1 tablet (10 mg total) by mouth daily.  Marland Kitchen omeprazole (PRILOSEC) 40 MG capsule Take 1 capsule (40 mg total) by mouth daily.  Glory Rosebush Delica Lancets 35T MISC USE TO CHECK BLOOD SUGAR DAILY  . ONETOUCH ULTRA test strip USE TO TEST BLOOD SUGAR DAILY  . SYMBICORT 160-4.5 MCG/ACT inhaler Inhale 2 puffs into the lungs 2 (two) times daily.  . valACYclovir (VALTREX) 1000 MG tablet 2 tablets twice a day for 1 day as needed for cold sores   No facility-administered medications prior to visit.       Objective    BP 124/70 (BP  Location: Right Arm, Patient Position: Sitting, Cuff Size: Normal)   Pulse 66   Temp 98.3 F (36.8 C) (Oral)   Wt 167 lb 3.2 oz (75.8 kg)   BMI 26.19 kg/m    Physical Exam   General: Appearance:     Overweight female in no acute distress  Eyes:    PERRL, conjunctiva/corneas clear, EOM's intact       Lungs:     Clear to auscultation bilaterally, respirations unlabored  Heart:    Normal heart rate. Normal rhythm. No murmurs, rubs, or gallops.   MS:   All extremities are intact.   Neurologic:   Awake, alert, oriented x 3. No apparent focal neurological  defect.        Results for orders placed or performed in visit on 01/26/20  POCT HgB A1C  Result Value Ref Range   Hemoglobin A1C 6.8 (A) 4.0 - 5.6 %   Estimated Average Glucose 148     Assessment & Plan     1. Type 2 diabetes mellitus with diabetic neuropathy, without long-term current use of insulin ([place]) Very controlled. Ccm   2. Essential hypertension Well controlled.  Continue current medications.    3. Pure hypercholesterolemia She is tolerating atorvastatin well with no adverse effects.    4. Estrogen deficiency  - DG Bone density [place]; Future  5. Need for influenza vaccination  - Flu Vaccine QUAD High Dose IM (Fluad)  Refill - valACYclovir (VALTREX) 1000 MG tablet; 2 tablets twice a day for 1 day as needed for cold sores  Dispense: 12 tablet; Refill: 2 - furosemide (LASIX) 20 MG tablet; Take 1 tablet (20 mg total) by mouth daily as needed (swelling).  Dispense: 20 tablet; Refill: 4   Future Appointments  Date Time Provider [place]  03/15/2020 10:30 AM [facility] VASC 3 [facility] None  03/15/2020 11:30 AM Kris Hartmann, NP [facility] None  07/26/2020 11:00 AM Birdie Sons, MD [facility]  01/09/2021  9:30 AM Hayden Pedro, MD TRE-TRE None         The entirety of the information documented in the History of Present Illness, Review of Systems and Physical Exam were personally obtained  by me. Portions of this information were initially documented by the CMA and reviewed by me for thoroughness and accuracy.      Lelon Huh, MD  [facility] [phone] (phone) [phone] (fax)  [place]

## 2020-01-26 ENCOUNTER — Other Ambulatory Visit: Payer: Self-pay

## 2020-01-26 ENCOUNTER — Ambulatory Visit (INDEPENDENT_AMBULATORY_CARE_PROVIDER_SITE_OTHER): Payer: Medicare Other | Admitting: Family Medicine

## 2020-01-26 ENCOUNTER — Encounter: Payer: Self-pay | Admitting: Family Medicine

## 2020-01-26 VITALS — BP 124/70 | HR 66 | Temp 98.3°F | Wt 167.2 lb

## 2020-01-26 DIAGNOSIS — E2839 Other primary ovarian failure: Secondary | ICD-10-CM

## 2020-01-26 DIAGNOSIS — E7800 Pure hypercholesterolemia, unspecified: Secondary | ICD-10-CM

## 2020-01-26 DIAGNOSIS — E1140 Type 2 diabetes mellitus with diabetic neuropathy, unspecified: Secondary | ICD-10-CM

## 2020-01-26 DIAGNOSIS — I10 Essential (primary) hypertension: Secondary | ICD-10-CM | POA: Diagnosis not present

## 2020-01-26 DIAGNOSIS — Z23 Encounter for immunization: Secondary | ICD-10-CM | POA: Diagnosis not present

## 2020-01-26 LAB — POCT GLYCOSYLATED HEMOGLOBIN (HGB A1C)
Estimated Average Glucose: 148
Hemoglobin A1C: 6.8 % — AB (ref 4.0–5.6)

## 2020-01-26 MED ORDER — FUROSEMIDE 20 MG PO TABS: 20.0000 mg | ORAL_TABLET | Freq: Every day | ORAL | 4 refills | Status: DC | PRN

## 2020-01-26 MED ORDER — VALACYCLOVIR HCL 1 G PO TABS: ORAL_TABLET | ORAL | 2 refills | Status: DC

## 2020-01-26 NOTE — Patient Instructions (Signed)
.   Please review the attached list of medications and notify my office if there are any errors.   . Please bring all of your medications to every appointment so we can make sure that our medication list is the same as yours.   

## 2020-02-17 ENCOUNTER — Other Ambulatory Visit: Payer: Self-pay | Admitting: Family Medicine

## 2020-02-17 DIAGNOSIS — F419 Anxiety disorder, unspecified: Secondary | ICD-10-CM

## 2020-02-17 DIAGNOSIS — K219 Gastro-esophageal reflux disease without esophagitis: Secondary | ICD-10-CM

## 2020-02-17 DIAGNOSIS — I10 Essential (primary) hypertension: Secondary | ICD-10-CM

## 2020-02-17 DIAGNOSIS — J301 Allergic rhinitis due to pollen: Secondary | ICD-10-CM

## 2020-02-17 NOTE — Telephone Encounter (Signed)
Requested  medications are  due for refill today yes  Requested medications are on the active medication list yes  Last refill 8/1  Future visit scheduled 07/2020  Notes to clinic Do not see Lexapro/anxiety/dep addressed in a visit since 01/25/2019, please assess.

## 2020-02-21 ENCOUNTER — Other Ambulatory Visit: Payer: Self-pay | Admitting: Family Medicine

## 2020-02-21 DIAGNOSIS — K219 Gastro-esophageal reflux disease without esophagitis: Secondary | ICD-10-CM

## 2020-03-13 ENCOUNTER — Other Ambulatory Visit (INDEPENDENT_AMBULATORY_CARE_PROVIDER_SITE_OTHER): Payer: Self-pay | Admitting: Nurse Practitioner

## 2020-03-13 DIAGNOSIS — I6521 Occlusion and stenosis of right carotid artery: Secondary | ICD-10-CM

## 2020-03-13 DIAGNOSIS — Z95828 Presence of other vascular implants and grafts: Secondary | ICD-10-CM

## 2020-03-15 ENCOUNTER — Encounter (INDEPENDENT_AMBULATORY_CARE_PROVIDER_SITE_OTHER): Payer: Medicare Other

## 2020-03-15 ENCOUNTER — Ambulatory Visit (INDEPENDENT_AMBULATORY_CARE_PROVIDER_SITE_OTHER): Payer: Medicare Other | Admitting: Nurse Practitioner

## 2020-03-20 ENCOUNTER — Ambulatory Visit (INDEPENDENT_AMBULATORY_CARE_PROVIDER_SITE_OTHER): Payer: Medicare Other | Admitting: Nurse Practitioner

## 2020-03-20 ENCOUNTER — Encounter (INDEPENDENT_AMBULATORY_CARE_PROVIDER_SITE_OTHER): Payer: Self-pay | Admitting: Nurse Practitioner

## 2020-03-20 ENCOUNTER — Other Ambulatory Visit: Payer: Self-pay

## 2020-03-20 ENCOUNTER — Ambulatory Visit (INDEPENDENT_AMBULATORY_CARE_PROVIDER_SITE_OTHER): Payer: Medicare Other

## 2020-03-20 VITALS — BP 126/70 | HR 78 | Resp 16 | Wt 165.6 lb

## 2020-03-20 DIAGNOSIS — I6521 Occlusion and stenosis of right carotid artery: Secondary | ICD-10-CM

## 2020-03-20 DIAGNOSIS — E7800 Pure hypercholesterolemia, unspecified: Secondary | ICD-10-CM | POA: Diagnosis not present

## 2020-03-20 DIAGNOSIS — Z95828 Presence of other vascular implants and grafts: Secondary | ICD-10-CM | POA: Diagnosis not present

## 2020-03-20 DIAGNOSIS — I10 Essential (primary) hypertension: Secondary | ICD-10-CM

## 2020-03-20 MED ORDER — CLOPIDOGREL BISULFATE 75 MG PO TABS: 75.0000 mg | ORAL_TABLET | Freq: Every day | ORAL | 6 refills | Status: DC

## 2020-03-24 NOTE — Progress Notes (Signed)
Subjective:    Patient ID: Erica Rivas, female    DOB: [birth year], 79 y.o.   MRN: [id number] Chief Complaint  Patient presents with  . Follow-up    38month ultrasound follow up    The patient is seen for follow up evaluation of carotid stenosis. The carotid stenosis followed by ultrasound.   The patient denies amaurosis fugax. There is no recent history of TIA symptoms or focal motor deficits. There is no prior documented CVA.  The patient is taking enteric-coated aspirin 81 mg daily.  There is no history of migraine headaches. There is no history of seizures.  The patient has a history of coronary artery disease, no recent episodes of angina or shortness of breath. The patient denies PAD or claudication symptoms. There is a history of hyperlipidemia which is being treated with a statin.    Carotid Duplex done today shows 1-39 percent stenosis bilaterally, with a patent right ICA stent with no evidence of restenosis.Marland Kitchen  No change compared to last study in 11/24/2019.  Normal flow hemodynamics seen in the bilateral subclavian arteries.  And bilateral vertebral arteries demonstrate antegrade flow.   Review of Systems  Eyes: Negative for visual disturbance.  Cardiovascular: Negative for chest pain.  Neurological: Negative for headaches.  All other systems reviewed and are negative.      Objective:   Physical Exam Vitals reviewed.  HENT:     Head: Normocephalic.  Neck:     Vascular: No carotid bruit.  Cardiovascular:     Rate and Rhythm: Normal rate.     Pulses: Normal pulses.  Pulmonary:     Effort: Pulmonary effort is normal.  Skin:    General: Skin is warm and dry.  Neurological:     Mental Status: She is alert and oriented to person, place, and time.  Psychiatric:        Mood and Affect: Mood normal.        Behavior: Behavior normal.        Thought Content: Thought content normal.        Judgment: Judgment normal.     BP 126/70 (BP Location: Right Arm)    Pulse 78   Resp 16   Wt 165 lb 9.6 oz (75.1 kg)   BMI 25.94 kg/m   Past Medical History:  Diagnosis Date  . Anemia   . Colon polyps   . COPD (chronic obstructive pulmonary disease) ([place])   . 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 Diabetes mellitus without complication ([place])   . Hemorrhoids   . History of chicken pox   . History of measles   . History of mumps   . Lung nodule 02/18/2015   No additional follow up needed per CT report 03/05/2015   . 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 Marital status: Married    Spouse name: Not on file  . Number of children: 2  . Years of education: Not on file  . Highest education level: Not on file  Occupational History  . Occupation: Retired    Comment: Formerly worked at [employer]. Retired in 2007  Tobacco Use  . Smoking status: Current Every Day Smoker    Packs/day: 0.75    Years: 50.00    Pack years: 37.50    Types: Cigarettes  . Smokeless tobacco: Never Used  Vaping Use  . Vaping Use: Never used  Substance and Sexual Activity  . Alcohol use:  No    Alcohol/week: 0.0 standard drinks  . Drug use: No  . Sexual activity: Not on file  Other Topics Concern  . Not on file  Social History Narrative  . Not on file   Social Determinants of Health   Financial Resource Strain:   . Difficulty of Paying Living Expenses: Not on file  Food Insecurity:   . Worried About Charity fundraiser in the Last Year: Not on file  . Ran Out of Food in the Last Year: Not on file  Transportation Needs:   . Lack of Transportation (Medical): Not on file  . Lack of Transportation (Non-Medical): Not on file  Physical Activity:   . Days of Exercise per Week: Not on file  . Minutes of Exercise per Session: Not on file  Stress:   . Feeling of Stress : Not on file  Social Connections:   . Frequency of Communication with Friends and Family: Not on file  . Frequency of Social Gatherings with  Friends and Family: Not on file  . Attends Religious Services: Not on file  . Active Member of Clubs or Organizations: Not on file  . Attends Archivist Meetings: Not on file  . Marital Status: Not on file  Intimate Partner Violence:   . Fear of Current or Ex-Partner: Not on file  . Emotionally Abused: Not on file  . Physically Abused: Not on file  . Sexually Abused: Not on file    Past Surgical History:  Procedure Laterality Date  . ABDOMINAL HYSTERECTOMY  1985   DUB; ovaries resected/ removed  . BREAST BIOPSY Right 11/12/2014   fibroadenomatous  . CARDIOVASCULAR STRESS TEST  02/2008   low risk  . CAROTID PTA/STENT INTERVENTION Right 11/13/2019   Procedure: CAROTID PTA/STENT INTERVENTION;  Surgeon: Algernon Huxley, MD;  Location: [facility];  Service: Cardiovascular;  Laterality: Right;  . COLONOSCOPY WITH PROPOFOL N/A 01/18/2018   Procedure: COLONOSCOPY WITH PROPOFOL;  Surgeon: Lin Landsman, MD;  Location: [facility];  Service: Gastroenterology;  Laterality: N/A;  . DOPPLER ECHOCARDIOGRAPHY  01/2010   EF>55%, LV relaxation impairment consistent with diastolic dysfunction, mild TR and MR  . ESOPHAGOGASTRODUODENOSCOPY  07/2009   +diffuse gastritis. Iftikhar  . ESOPHAGOGASTRODUODENOSCOPY (EGD) WITH PROPOFOL N/A 01/18/2018   Procedure: ESOPHAGOGASTRODUODENOSCOPY (EGD) WITH PROPOFOL;  Surgeon: Lin Landsman, MD;  Location: [place];  Service: Gastroenterology;  Laterality: N/A;  . GALLBLADDER SURGERY  1994  . HAND SURGERY Right 2010   surgery x 2. Dr. Sabra Heck  . NECK SURGERY  1998   Disc surgey on neck  . Sinus Surgery    . TUBAL LIGATION  1971    Family History  Problem Relation Age of Onset  . Hypertension Sister   . COPD Sister   . Thyroid disease Sister   . Breast cancer Sister 87  . Thyroid disease Sister   . Heart attack Mother   . Hypertension Mother   . Thyroid disease Mother        HYPERTHYROIDISM  . GI Bleed Father   . Lung  cancer Brother   . Breast cancer Sister   . Hypertension Sister   . Aneurysm Sister        in the back of her neck  . Heart attack Brother        CABG    No Known Allergies  CBC Latest Ref Rng & Units 11/14/2019 06/26/2019 11/23/2017  WBC 4.0 - 10.5 K/uL 11.7(H) 12.5(H) 13.4(H)  Hemoglobin 12.0 - 15.0 g/dL 9.0(L) 11.8 10.2(L)  Hematocrit 36 - 46 % 27.5(L) 35.5 32.1(L)  Platelets 150 - 400 K/uL 181 268 320      CMP     Component Value Date/Time   NA 136 11/14/2019 0419   NA 137 06/26/2019 0929   K 3.8 11/14/2019 0419   CL 103 11/14/2019 0419   CO2 25 11/14/2019 0419   GLUCOSE 101 (H) 11/14/2019 0419   BUN 11 11/14/2019 0419   BUN 21 06/26/2019 0929   CREATININE 1.31 (H) 11/14/2019 0419   CALCIUM 8.2 (L) 11/14/2019 0419   PROT 6.9 06/26/2019 0929   ALBUMIN 4.3 06/26/2019 0929   AST 17 06/26/2019 0929   ALT 14 06/26/2019 0929   ALKPHOS 94 06/26/2019 0929   BILITOT 0.3 06/26/2019 0929   GFRNONAA 39 (L) 11/14/2019 0419   GFRAA 45 (L) 11/14/2019 0419        Assessment & Plan:   1. Carotid stenosis, right Recommend:  Given the patient's asymptomatic subcritical stenosis no further invasive testing or surgery at this time.  Duplex ultrasound shows 1-39% stenosis bilaterally.  Continue antiplatelet therapy as prescribed Continue management of CAD, HTN and Hyperlipidemia Healthy heart diet,  encouraged exercise at least 4 times per week Follow up in 6 months with duplex ultrasound and physical exam  - clopidogrel (PLAVIX) 75 MG tablet; Take 1 tablet (75 mg total) by mouth daily.  Dispense: 30 tablet; Refill: 6  2. Essential hypertension Continue antihypertensive medications as already ordered, these medications have been reviewed and there are no changes at this time.   3. Pure hypercholesterolemia Continue statin as ordered and reviewed, no changes at this time    Current Outpatient Medications on File Prior to Visit  Medication Sig Dispense Refill  .  albuterol (PROAIR HFA) 108 (90 Base) MCG/ACT inhaler INHALE TWO PUFFS BY MOUTH EVERY 6 HOURS AS NEEDED FOR WHEEZING OR  SHORTNESS  OF  BREATH 18 g 3  . amitriptyline (ELAVIL) 25 MG tablet TAKE 1 TABLET BY MOUTH AT  BEDTIME 90 tablet 1  . amLODipine (NORVASC) 5 MG tablet Take 1 tablet (5 mg total) by mouth daily. 90 tablet 3  . aspirin EC 81 MG tablet Take 81 mg by mouth daily.    Marland Kitchen atenolol (TENORMIN) 50 MG tablet TAKE 1 TABLET BY MOUTH  TWICE DAILY 180 tablet 1  . atorvastatin (LIPITOR) 80 MG tablet Take 1 tablet (80 mg total) by mouth daily. 90 tablet 3  . Cholecalciferol (VITAMIN D) 2000 UNITS tablet Take 1,000 Units by mouth daily.     Marland Kitchen escitalopram (LEXAPRO) 10 MG tablet TAKE 1 TABLET BY MOUTH  DAILY 90 tablet 3  . furosemide (LASIX) 20 MG tablet Take 1 tablet (20 mg total) by mouth daily as needed (swelling). 20 tablet 4  . glipiZIDE (GLUCOTROL XL) 10 MG 24 hr tablet TAKE 1 TABLET BY MOUTH  DAILY 90 tablet 1  . lisinopril-hydrochlorothiazide (ZESTORETIC) 20-25 MG tablet TAKE 1 TABLET BY MOUTH  DAILY 90 tablet 1  . metFORMIN (GLUCOPHAGE) 1000 MG tablet TAKE 1 TABLET BY MOUTH  TWICE DAILY 180 tablet 1  . montelukast (SINGULAIR) 10 MG tablet TAKE 1 TABLET BY MOUTH  DAILY 90 tablet 2  . omeprazole (PRILOSEC) 40 MG capsule TAKE 1 CAPSULE BY MOUTH  DAILY 90 capsule 0  . OneTouch Delica Lancets 28J MISC USE TO CHECK BLOOD SUGAR DAILY 100 each 3  . ONETOUCH ULTRA test strip USE TO TEST BLOOD SUGAR DAILY  50 each 0  . SYMBICORT 160-4.5 MCG/ACT inhaler Inhale 2 puffs into the lungs 2 (two) times daily. 3 Inhaler 4  . valACYclovir (VALTREX) 1000 MG tablet 2 tablets twice a day for 1 day as needed for cold sores 12 tablet 2   No current facility-administered medications on file prior to visit.    There are no Patient Instructions on file for this visit. No follow-ups on file.   Kris Hartmann, NP

## 2020-04-04 ENCOUNTER — Other Ambulatory Visit: Payer: Medicare Other

## 2020-04-21 ENCOUNTER — Other Ambulatory Visit: Payer: Self-pay | Admitting: Family Medicine

## 2020-04-21 DIAGNOSIS — K219 Gastro-esophageal reflux disease without esophagitis: Secondary | ICD-10-CM

## 2020-05-01 ENCOUNTER — Other Ambulatory Visit: Payer: Self-pay | Admitting: Family Medicine

## 2020-05-01 DIAGNOSIS — K219 Gastro-esophageal reflux disease without esophagitis: Secondary | ICD-10-CM

## 2020-05-01 MED ORDER — OMEPRAZOLE 40 MG PO CPDR: 40.0000 mg | DELAYED_RELEASE_CAPSULE | Freq: Every day | ORAL | 0 refills | Status: DC

## 2020-05-01 NOTE — Telephone Encounter (Signed)
Medication Refill - Medication: omeprazole (PRILOSEC) 40 MG capsule  Has the patient contacted their pharmacy? Yes.    (Agent: If yes, when and what did the pharmacy advise?)  Preferred Pharmacy (with phone number or street name):  [facility], [address], [address] Phone:  [phone]  Fax:  [phone]       Agent: Please be advised that RX refills may take up to 3 business days. We ask that you follow-up with your pharmacy.

## 2020-05-20 ENCOUNTER — Telehealth: Payer: Self-pay | Admitting: Family Medicine

## 2020-05-20 ENCOUNTER — Telehealth (INDEPENDENT_AMBULATORY_CARE_PROVIDER_SITE_OTHER): Payer: Medicare Other | Admitting: Family Medicine

## 2020-05-20 ENCOUNTER — Other Ambulatory Visit: Payer: Self-pay

## 2020-05-20 DIAGNOSIS — J441 Chronic obstructive pulmonary disease with (acute) exacerbation: Secondary | ICD-10-CM | POA: Diagnosis not present

## 2020-05-20 DIAGNOSIS — R059 Cough, unspecified: Secondary | ICD-10-CM

## 2020-05-20 MED ORDER — LEVOFLOXACIN 500 MG PO TABS: 500.0000 mg | ORAL_TABLET | Freq: Every day | ORAL | 0 refills | Status: AC

## 2020-05-20 MED ORDER — PREDNISONE 10 MG PO TABS: ORAL_TABLET | ORAL | 0 refills | Status: DC

## 2020-05-20 NOTE — Telephone Encounter (Addendum)
Made pt appt.  Opened in error

## 2020-05-20 NOTE — Progress Notes (Signed)
Virtual telephone visit    Virtual Visit via Telephone Note   This visit type was conducted due to national recommendations for restrictions regarding the COVID-19 Pandemic (e.g. social distancing) in an effort to limit this patient's exposure and mitigate transmission in our community. Due to her co-morbid illnesses, this patient is at least at moderate risk for complications without adequate follow up. This format is felt to be most appropriate for this patient at this time. The patient did not have access to video technology or had technical difficulties with video requiring transitioning to audio format only (telephone). Physical exam was limited to content and character of the telephone converstion.    Patient location: bfp  Provider location: brief  I discussed the limitations of evaluation and management by telemedicine and the availability of in person appointments. The patient expressed understanding and agreed to proceed.   Visit Date: 05/20/2020  Today's healthcare provider: Lelon Huh, MD   No chief complaint on file.  Subjective    Cough The current episode started in the past 7 days. The problem has been gradually worsening. The cough is productive of sputum. Associated symptoms include headaches, nasal congestion, postnasal drip, shortness of breath and wheezing. Pertinent negatives include no chills or fever. She has tried steroid inhaler and OTC cough suppressant for the symptoms. The treatment provided mild relief. Her past medical history is significant for COPD and environmental allergies.   Is also using albuterol inhaler is short of breath when walking. Had Covid booster about 3 months.        Medications: Outpatient Medications Prior to Visit  Medication Sig  . albuterol (PROAIR HFA) 108 (90 Base) MCG/ACT inhaler INHALE TWO PUFFS BY MOUTH EVERY 6 HOURS AS NEEDED FOR WHEEZING OR  SHORTNESS  OF  BREATH  . amitriptyline (ELAVIL) 25 MG tablet TAKE 1 TABLET  BY MOUTH AT  BEDTIME  . amLODipine (NORVASC) 5 MG tablet Take 1 tablet (5 mg total) by mouth daily.  Marland Kitchen aspirin EC 81 MG tablet Take 81 mg by mouth daily.  Marland Kitchen atenolol (TENORMIN) 50 MG tablet TAKE 1 TABLET BY MOUTH  TWICE DAILY  . atorvastatin (LIPITOR) 80 MG tablet Take 1 tablet (80 mg total) by mouth daily.  . Cholecalciferol (VITAMIN D) 2000 UNITS tablet Take 1,000 Units by mouth daily.   . clopidogrel (PLAVIX) 75 MG tablet Take 1 tablet (75 mg total) by mouth daily.  Marland Kitchen escitalopram (LEXAPRO) 10 MG tablet TAKE 1 TABLET BY MOUTH  DAILY  . furosemide (LASIX) 20 MG tablet Take 1 tablet (20 mg total) by mouth daily as needed (swelling).  Marland Kitchen glipiZIDE (GLUCOTROL XL) 10 MG 24 hr tablet TAKE 1 TABLET BY MOUTH  DAILY  . lisinopril-hydrochlorothiazide (ZESTORETIC) 20-25 MG tablet TAKE 1 TABLET BY MOUTH  DAILY  . metFORMIN (GLUCOPHAGE) 1000 MG tablet TAKE 1 TABLET BY MOUTH  TWICE DAILY  . montelukast (SINGULAIR) 10 MG tablet TAKE 1 TABLET BY MOUTH  DAILY  . omeprazole (PRILOSEC) 40 MG capsule Take 1 capsule (40 mg total) by mouth daily.  Glory Rosebush Delica Lancets 47Q MISC USE TO CHECK BLOOD SUGAR DAILY  . ONETOUCH ULTRA test strip USE TO TEST BLOOD SUGAR DAILY  . SYMBICORT 160-4.5 MCG/ACT inhaler Inhale 2 puffs into the lungs 2 (two) times daily.  . valACYclovir (VALTREX) 1000 MG tablet 2 tablets twice a day for 1 day as needed for cold sores   No facility-administered medications prior to visit.    Review of Systems  Constitutional: Negative for chills and fever.  HENT: Positive for postnasal drip.   Respiratory: Positive for cough, shortness of breath and wheezing.   Allergic/Immunologic: Positive for environmental allergies.  Neurological: Positive for headaches.      Objective    There were no vitals taken for this visit.   Awake, alert, oriented x 3. In no apparent distress   Assessment & Plan     1. Cough  - predniSONE (DELTASONE) 10 MG tablet; 6 tablets for 1 day, then 5 for 1  day, then 4 for 1 day, then 3 for 1 day, then 2 for 1 day then 1 for 1 day.  Dispense: 21 tablet; Refill: 0  2. COPD exacerbation (HCC)  - levofloxacin (LEVAQUIN) 500 MG tablet; Take 1 tablet (500 mg total) by mouth daily for 7 days.  Dispense: 7 tablet; Refill: 0 - predniSONE (DELTASONE) 10 MG tablet; 6 tablets for 1 day, then 5 for 1 day, then 4 for 1 day, then 3 for 1 day, then 2 for 1 day then 1 for 1 day.  Dispense: 21 tablet; Refill: 0   Call if symptoms change or if not rapidly improving.        I discussed the assessment and treatment plan with the patient. The patient was provided an opportunity to ask questions and all were answered. The patient agreed with the plan and demonstrated an understanding of the instructions.   The patient was advised to call back or seek an in-person evaluation if the symptoms worsen or if the condition fails to improve as anticipated.  I provided 11 minutes of non-face-to-face time during this encounter.  The entirety of the information documented in the History of Present Illness, Review of Systems and Physical Exam were personally obtained by me. Portions of this information were initially documented by the CMA and reviewed by me for thoroughness and accuracy.     Lelon Huh, MD [facility] [phone] (phone) [phone] (fax)  [place]

## 2020-05-25 ENCOUNTER — Other Ambulatory Visit: Payer: Self-pay | Admitting: Family Medicine

## 2020-05-25 DIAGNOSIS — I10 Essential (primary) hypertension: Secondary | ICD-10-CM

## 2020-07-02 ENCOUNTER — Other Ambulatory Visit: Payer: Self-pay | Admitting: Family Medicine

## 2020-07-02 DIAGNOSIS — K219 Gastro-esophageal reflux disease without esophagitis: Secondary | ICD-10-CM

## 2020-07-04 ENCOUNTER — Other Ambulatory Visit: Payer: Self-pay

## 2020-07-04 ENCOUNTER — Telehealth: Payer: Self-pay

## 2020-07-04 DIAGNOSIS — J432 Centrilobular emphysema: Secondary | ICD-10-CM

## 2020-07-04 MED ORDER — SYMBICORT 160-4.5 MCG/ACT IN AERO: 2.0000 | INHALATION_SPRAY | Freq: Two times a day (BID) | RESPIRATORY_TRACT | 4 refills | Status: DC

## 2020-07-04 NOTE — Telephone Encounter (Signed)
[facility] faxed refill request for the following medications:  SYMBICORT 160-4.5 MCG/ACT inhaler   Please advise. Thanks, [employer]

## 2020-07-26 ENCOUNTER — Ambulatory Visit: Payer: Self-pay | Admitting: Family Medicine

## 2020-07-28 ENCOUNTER — Telehealth: Payer: Self-pay | Admitting: *Deleted

## 2020-07-28 NOTE — Telephone Encounter (Signed)
Notified Erica Rivas that is time to schedule her annual lung cancer screening CT scan. Patient currently smokes one pack of cigarettes per day. Appointment scheduled for 08/21/2020 at 11:15.

## 2020-07-29 ENCOUNTER — Other Ambulatory Visit: Payer: Self-pay | Admitting: *Deleted

## 2020-07-29 DIAGNOSIS — Z87891 Personal history of nicotine dependence: Secondary | ICD-10-CM

## 2020-07-29 DIAGNOSIS — Z122 Encounter for screening for malignant neoplasm of respiratory organs: Secondary | ICD-10-CM

## 2020-07-29 DIAGNOSIS — F17200 Nicotine dependence, unspecified, uncomplicated: Secondary | ICD-10-CM

## 2020-07-29 NOTE — Progress Notes (Signed)
Contacted and scheduled for annual lung screening scan. Patient is a current smoker with a 54 pack year history.  

## 2020-08-03 ENCOUNTER — Other Ambulatory Visit: Payer: Self-pay | Admitting: Family Medicine

## 2020-08-03 NOTE — Telephone Encounter (Signed)
Requested Prescriptions  Pending Prescriptions Disp Refills  . ONETOUCH ULTRA test strip [employer] Med Name: OneTouch Ultra Blue In Vitro Strip] 50 each 0    Sig: USE TO TEST BLOOD SUGAR DAILY.     Endocrinology: Diabetes - Testing Supplies Passed - 08/03/2020  6:55 PM      Passed - Valid encounter within last 12 months    Recent Outpatient Visits          2 months ago Cough   [facility] Birdie Sons, MD   6 months ago Type 2 diabetes mellitus with diabetic neuropathy, without long-term current use of insulin [facility])   [facility] Birdie Sons, MD   10 months ago Type 2 diabetes mellitus with diabetic neuropathy, without long-term current use of insulin ([place])   [facility] Birdie Sons, MD   1 year ago Type 2 diabetes mellitus with diabetic neuropathy, without long-term current use of insulin [facility])   [facility] Birdie Sons, MD   1 year ago Type 2 diabetes mellitus with diabetic neuropathy, without long-term current use of insulin [facility])   [facility] Birdie Sons, MD      Future Appointments            In 3 days Fisher, Kirstie Peri, MD [facility], [place]   In 1 week Ralene Bathe, MD [place]

## 2020-08-06 ENCOUNTER — Other Ambulatory Visit: Payer: Self-pay

## 2020-08-06 ENCOUNTER — Ambulatory Visit (INDEPENDENT_AMBULATORY_CARE_PROVIDER_SITE_OTHER): Payer: Medicare Other | Admitting: Family Medicine

## 2020-08-06 VITALS — BP 121/55 | HR 68 | Ht 66.0 in | Wt 158.4 lb

## 2020-08-06 DIAGNOSIS — I700 Atherosclerosis of aorta: Secondary | ICD-10-CM | POA: Diagnosis not present

## 2020-08-06 DIAGNOSIS — I10 Essential (primary) hypertension: Secondary | ICD-10-CM | POA: Diagnosis not present

## 2020-08-06 DIAGNOSIS — D509 Iron deficiency anemia, unspecified: Secondary | ICD-10-CM | POA: Diagnosis not present

## 2020-08-06 DIAGNOSIS — J432 Centrilobular emphysema: Secondary | ICD-10-CM | POA: Diagnosis not present

## 2020-08-06 DIAGNOSIS — E559 Vitamin D deficiency, unspecified: Secondary | ICD-10-CM

## 2020-08-06 DIAGNOSIS — D519 Vitamin B12 deficiency anemia, unspecified: Secondary | ICD-10-CM | POA: Diagnosis not present

## 2020-08-06 DIAGNOSIS — E1140 Type 2 diabetes mellitus with diabetic neuropathy, unspecified: Secondary | ICD-10-CM

## 2020-08-06 LAB — POCT GLYCOSYLATED HEMOGLOBIN (HGB A1C)
Estimated Average Glucose: 154
Hemoglobin A1C: 7 % — AB (ref 4.0–5.6)

## 2020-08-06 NOTE — Progress Notes (Signed)
Established patient visit   Patient: Erica Rivas   DOB: 01/08/1941   80 y.o. Female  MRN: [id number] Visit Date: 08/06/2020  Today's healthcare provider: Lelon Huh, MD   Chief Complaint  Patient presents with  . Hypertension  . Diabetes  . Hyperlipidemia   Subjective    HPI  Diabetes Mellitus Type II, Follow-up  Lab Results  Component Value Date   HGBA1C 6.8 (A) 01/26/2020   HGBA1C 7.0 (A) 09/25/2019   HGBA1C 7.0 (H) 06/26/2019   Wt Readings from Last 3 Encounters:  03/20/20 165 lb 9.6 oz (75.1 kg)  01/26/20 167 lb 3.2 oz (75.8 kg)  12/14/19 164 lb (74.4 kg)   Last seen for diabetes 6 months ago.  Management since then includes continue same medications. She reports excellent compliance with treatment. She is not having side effects.  Symptoms: Yes fatigue No foot ulcerations  No appetite changes No nausea  Yes paresthesia of the feet  No polydipsia  No polyuria No visual disturbances   No vomiting     Home blood sugar records: fasting range: 110  Episodes of hypoglycemia? No    Current insulin regiment: none Most Recent Eye Exam: not UTD Current exercise: none Current diet habits: a general diet that is in between healthy and unhealthy  Pertinent Labs: Lab Results  Component Value Date   CHOL 152 06/26/2019   HDL 62 06/26/2019   LDLCALC 75 06/26/2019   TRIG 81 06/26/2019   CHOLHDL 2.5 06/26/2019   Lab Results  Component Value Date   NA 136 11/14/2019   K 3.8 11/14/2019   CREATININE 1.31 (H) 11/14/2019   GFRNONAA 39 (L) 11/14/2019   GFRAA 45 (L) 11/14/2019   GLUCOSE 101 (H) 11/14/2019     --------------------------------------------------------------------------------------------------- Hypertension, follow-up  BP Readings from Last 3 Encounters:  03/20/20 126/70  01/26/20 124/70  12/14/19 (!) 111/63   Wt Readings from Last 3 Encounters:  03/20/20 165 lb 9.6 oz (75.1 kg)  01/26/20 167 lb 3.2 oz (75.8 kg)  12/14/19 164 lb  (74.4 kg)     She was last seen for hypertension 6 months ago.  BP at that visit was 124/70. Management since that visit includes continuing same medications.  She reports excellent compliance with treatment. She is not having side effects.   She is following a Regular diet. She is not exercising. She does smoke.  Use of agents associated with hypertension: NSAIDS.   Outside blood pressures are n/a. Symptoms: No chest pain No chest pressure  No palpitations No syncope  Yes dyspnea - COPD No orthopnea  No paroxysmal nocturnal dyspnea Yes lower extremity edema -  Around ankles   Pertinent labs: Lab Results  Component Value Date   CHOL 152 06/26/2019   HDL 62 06/26/2019   LDLCALC 75 06/26/2019   TRIG 81 06/26/2019   CHOLHDL 2.5 06/26/2019   Lab Results  Component Value Date   NA 136 11/14/2019   K 3.8 11/14/2019   CREATININE 1.31 (H) 11/14/2019   GFRNONAA 39 (L) 11/14/2019   GFRAA 45 (L) 11/14/2019   GLUCOSE 101 (H) 11/14/2019     The 10-year ASCVD risk score Mikey Bussing DC Jr., et al., 2013) is: 58.5%   --------------------------------------------------------------------------------------------------- Lipid/Cholesterol, Follow-up  Last lipid panel Other pertinent labs  Lab Results  Component Value Date   CHOL 152 06/26/2019   HDL 62 06/26/2019   LDLCALC 75 06/26/2019   TRIG 81 06/26/2019   CHOLHDL 2.5 06/26/2019  Lab Results  Component Value Date   ALT 14 06/26/2019   AST 17 06/26/2019   PLT 181 11/14/2019   TSH 1.630 06/26/2019     She was last seen for this 6 months ago.  Management since that visit includes continuing same medications.  She reports excellent compliance with treatment. She is not having side effects.   Symptoms: No chest pain No chest pressure/discomfort  Yes dyspnea Yes lower extremity edema  No numbness or tingling of extremity No orthopnea  No palpitations No paroxysmal nocturnal dyspnea  No speech difficulty No syncope   Current  diet: between healthy and unhealthy diet Current exercise: none  The 10-year ASCVD risk score Mikey Bussing DC Jr., et al., 2013) is: 58.5%  ---------------------------------------------------------------------------------------------------  COPD Follow up  Much better on Symbicort, but still uses albuterol every day.      Medications: Outpatient Medications Prior to Visit  Medication Sig  . albuterol (PROAIR HFA) 108 (90 Base) MCG/ACT inhaler INHALE TWO PUFFS BY MOUTH EVERY 6 HOURS AS NEEDED FOR WHEEZING OR  SHORTNESS  OF  BREATH  . amitriptyline (ELAVIL) 25 MG tablet TAKE 1 TABLET BY MOUTH AT  BEDTIME  . amLODipine (NORVASC) 5 MG tablet Take 1 tablet (5 mg total) by mouth daily.  Marland Kitchen aspirin EC 81 MG tablet Take 81 mg by mouth daily.  Marland Kitchen atenolol (TENORMIN) 50 MG tablet TAKE 1 TABLET BY MOUTH  TWICE DAILY  . atorvastatin (LIPITOR) 80 MG tablet Take 1 tablet (80 mg total) by mouth daily.  . Cholecalciferol (VITAMIN D) 2000 UNITS tablet Take 1,000 Units by mouth daily.   . clopidogrel (PLAVIX) 75 MG tablet Take 1 tablet (75 mg total) by mouth daily.  Marland Kitchen escitalopram (LEXAPRO) 10 MG tablet TAKE 1 TABLET BY MOUTH  DAILY  . furosemide (LASIX) 20 MG tablet Take 1 tablet (20 mg total) by mouth daily as needed (swelling).  Marland Kitchen glipiZIDE (GLUCOTROL XL) 10 MG 24 hr tablet TAKE 1 TABLET BY MOUTH  DAILY  . lisinopril-hydrochlorothiazide (ZESTORETIC) 20-25 MG tablet TAKE 1 TABLET BY MOUTH  DAILY  . metFORMIN (GLUCOPHAGE) 1000 MG tablet TAKE 1 TABLET BY MOUTH  TWICE DAILY  . montelukast (SINGULAIR) 10 MG tablet TAKE 1 TABLET BY MOUTH  DAILY  . omeprazole (PRILOSEC) 40 MG capsule TAKE 1 CAPSULE BY MOUTH  DAILY  . OneTouch Delica Lancets 85O MISC USE TO CHECK BLOOD SUGAR DAILY  . ONETOUCH ULTRA test strip USE TO TEST BLOOD SUGAR DAILY.  . SYMBICORT 160-4.5 MCG/ACT inhaler Inhale 2 puffs into the lungs 2 (two) times daily.  . valACYclovir (VALTREX) 1000 MG tablet 2 tablets twice a day for 1 day as needed for cold  sores   No facility-administered medications prior to visit.    Review of Systems     Objective    BP (!) 121/55 (BP Location: Right Arm, Patient Position: Sitting, Cuff Size: Normal)   Pulse 68   Ht 5\' 6"  (1.676 m)   Wt 158 lb 6.4 oz (71.8 kg)   SpO2 100%   BMI 25.57 kg/m     Physical Exam   General: Appearance:     Well developed, well nourished female in no acute distress  Eyes:    PERRL, conjunctiva/corneas clear, EOM's intact       Lungs:     Clear to auscultation bilaterally, respirations unlabored  Heart:    Normal heart rate. Normal rhythm. No murmurs, rubs, or gallops.   MS:   All extremities are intact.  Neurologic:   Awake, alert, oriented x 3. No apparent focal neurological           defect.         Results for orders placed or performed in visit on 08/06/20  POCT HgB A1C  Result Value Ref Range   Hemoglobin A1C 7.0 (A) 4.0 - 5.6 %   Estimated Average Glucose 154     Assessment & Plan     1. Type 2 diabetes mellitus with diabetic neuropathy, without long-term current use of insulin (HCC) Well controlled.  Continue current medications.    2. Essential hypertension Well controlled.   Current Outpatient Medications  Medication Sig Dispense Refill  . albuterol (PROAIR HFA) 108 (90 Base) MCG/ACT inhaler INHALE TWO PUFFS BY MOUTH EVERY 6 HOURS AS NEEDED FOR WHEEZING OR  SHORTNESS  OF  BREATH 18 g 3  . amitriptyline (ELAVIL) 25 MG tablet TAKE 1 TABLET BY MOUTH AT  BEDTIME 90 tablet 0  . amLODipine (NORVASC) 5 MG tablet Take 1 tablet (5 mg total) by mouth daily. 90 tablet 3  . aspirin EC 81 MG tablet Take 81 mg by mouth daily.    Marland Kitchen atenolol (TENORMIN) 50 MG tablet TAKE 1 TABLET BY MOUTH  TWICE DAILY 180 tablet 0  . atorvastatin (LIPITOR) 80 MG tablet Take 1 tablet (80 mg total) by mouth daily. 90 tablet 3  . Cholecalciferol (VITAMIN D) 2000 UNITS tablet Take 1,000 Units by mouth daily.     . clopidogrel (PLAVIX) 75 MG tablet Take 1 tablet (75 mg total) by  mouth daily. 30 tablet 6  . escitalopram (LEXAPRO) 10 MG tablet TAKE 1 TABLET BY MOUTH  DAILY 90 tablet 3  . furosemide (LASIX) 20 MG tablet Take 1 tablet (20 mg total) by mouth daily as needed (swelling). 20 tablet 4  . glipiZIDE (GLUCOTROL XL) 10 MG 24 hr tablet TAKE 1 TABLET BY MOUTH  DAILY 90 tablet 0  . lisinopril-hydrochlorothiazide (ZESTORETIC) 20-25 MG tablet TAKE 1 TABLET BY MOUTH  DAILY 90 tablet 1  . metFORMIN (GLUCOPHAGE) 1000 MG tablet TAKE 1 TABLET BY MOUTH  TWICE DAILY 180 tablet 0  . montelukast (SINGULAIR) 10 MG tablet TAKE 1 TABLET BY MOUTH  DAILY 90 tablet 2  . omeprazole (PRILOSEC) 40 MG capsule TAKE 1 CAPSULE BY MOUTH  DAILY 90 capsule 1  . OneTouch Delica Lancets 86V MISC USE TO CHECK BLOOD SUGAR DAILY 100 each 3  . ONETOUCH ULTRA test strip USE TO TEST BLOOD SUGAR DAILY. 50 each 0  . SYMBICORT 160-4.5 MCG/ACT inhaler Inhale 2 puffs into the lungs 2 (two) times daily. 3 each 4  . valACYclovir (VALTREX) 1000 MG tablet 2 tablets twice a day for 1 day as needed for cold sores 12 tablet 2   No current facility-administered medications for this visit.     3. Centrilobular emphysema ([place]) Much better on Symbicort. Continue current inhalers.   4. Vitamin D deficiency  - VITAMIN D 25 Hydroxy (Vit-D Deficiency, Fractures)  5. Iron deficiency anemia, unspecified iron deficiency anemia type  - CBC  6. Anemia due to vitamin B12 deficiency, unspecified B12 deficiency type  - Vitamin B12  7. Atherosclerosis of aorta (HCC) Asymptomatic. Compliant with medication.  Continue aggressive risk factor modification.   - Comprehensive metabolic panel - Lipid panel      The entirety of the information documented in the History of Present Illness, Review of Systems and Physical Exam were personally obtained by me. Portions of this information  were initially documented by the CMA and reviewed by me for thoroughness and accuracy.      Lelon Huh, MD  [facility] [phone] (phone) [phone] (fax)  [place]

## 2020-08-08 DIAGNOSIS — I700 Atherosclerosis of aorta: Secondary | ICD-10-CM | POA: Diagnosis not present

## 2020-08-08 DIAGNOSIS — D519 Vitamin B12 deficiency anemia, unspecified: Secondary | ICD-10-CM | POA: Diagnosis not present

## 2020-08-08 DIAGNOSIS — D509 Iron deficiency anemia, unspecified: Secondary | ICD-10-CM | POA: Diagnosis not present

## 2020-08-08 DIAGNOSIS — E559 Vitamin D deficiency, unspecified: Secondary | ICD-10-CM | POA: Diagnosis not present

## 2020-08-09 LAB — CBC
Hematocrit: 31.5 % — ABNORMAL LOW (ref 34.0–46.6)
Hemoglobin: 10.1 g/dL — ABNORMAL LOW (ref 11.1–15.9)
MCH: 26.3 pg — ABNORMAL LOW (ref 26.6–33.0)
MCHC: 32.1 g/dL (ref 31.5–35.7)
MCV: 82 fL (ref 79–97)
Platelets: 300 10*3/uL (ref 150–450)
RBC: 3.84 x10E6/uL (ref 3.77–5.28)
RDW: 14.5 % (ref 11.7–15.4)
WBC: 14 10*3/uL — ABNORMAL HIGH (ref 3.4–10.8)

## 2020-08-09 LAB — COMPREHENSIVE METABOLIC PANEL
ALT: 15 IU/L (ref 0–32)
AST: 16 IU/L (ref 0–40)
Albumin/Globulin Ratio: 1.5 (ref 1.2–2.2)
Albumin: 4.1 g/dL (ref 3.7–4.7)
Alkaline Phosphatase: 90 IU/L (ref 44–121)
BUN/Creatinine Ratio: 13 (ref 12–28)
BUN: 18 mg/dL (ref 8–27)
Bilirubin Total: 0.2 mg/dL (ref 0.0–1.2)
CO2: 24 mmol/L (ref 20–29)
Calcium: 9.9 mg/dL (ref 8.7–10.3)
Chloride: 94 mmol/L — ABNORMAL LOW (ref 96–106)
Creatinine, Ser: 1.41 mg/dL — ABNORMAL HIGH (ref 0.57–1.00)
Globulin, Total: 2.7 g/dL (ref 1.5–4.5)
Glucose: 121 mg/dL — ABNORMAL HIGH (ref 65–99)
Potassium: 4.9 mmol/L (ref 3.5–5.2)
Sodium: 135 mmol/L (ref 134–144)
Total Protein: 6.8 g/dL (ref 6.0–8.5)
eGFR: 38 mL/min/{1.73_m2} — ABNORMAL LOW (ref >59)

## 2020-08-09 LAB — VITAMIN D 25 HYDROXY (VIT D DEFICIENCY, FRACTURES): Vit D, 25-Hydroxy: 31.5 ng/mL (ref 30.0–100.0)

## 2020-08-09 LAB — VITAMIN B12: Vitamin B-12: 320 pg/mL (ref 232–1245)

## 2020-08-09 LAB — LIPID PANEL
Chol/HDL Ratio: 2.5 ratio (ref 0.0–4.4)
Cholesterol, Total: 140 mg/dL (ref 100–199)
HDL: 56 mg/dL (ref >39)
LDL Chol Calc (NIH): 67 mg/dL (ref 0–99)
Triglycerides: 90 mg/dL (ref 0–149)
VLDL Cholesterol Cal: 17 mg/dL (ref 5–40)

## 2020-08-14 ENCOUNTER — Other Ambulatory Visit: Payer: Self-pay

## 2020-08-14 ENCOUNTER — Encounter: Payer: Self-pay | Admitting: Dermatology

## 2020-08-14 ENCOUNTER — Ambulatory Visit: Payer: Medicare Other | Admitting: Dermatology

## 2020-08-14 DIAGNOSIS — L570 Actinic keratosis: Secondary | ICD-10-CM

## 2020-08-14 DIAGNOSIS — L820 Inflamed seborrheic keratosis: Secondary | ICD-10-CM | POA: Diagnosis not present

## 2020-08-14 DIAGNOSIS — L720 Epidermal cyst: Secondary | ICD-10-CM | POA: Diagnosis not present

## 2020-08-14 DIAGNOSIS — C4361 Malignant melanoma of right upper limb, including shoulder: Secondary | ICD-10-CM

## 2020-08-14 DIAGNOSIS — D492 Neoplasm of unspecified behavior of bone, soft tissue, and skin: Secondary | ICD-10-CM

## 2020-08-14 DIAGNOSIS — L578 Other skin changes due to chronic exposure to nonionizing radiation: Secondary | ICD-10-CM | POA: Diagnosis not present

## 2020-08-14 DIAGNOSIS — C439 Malignant melanoma of skin, unspecified: Secondary | ICD-10-CM

## 2020-08-14 HISTORY — DX: Malignant melanoma of skin, unspecified: C43.9

## 2020-08-14 MED ORDER — MUPIROCIN 2 % EX OINT: 1.0000 "application " | TOPICAL_OINTMENT | Freq: Every day | CUTANEOUS | 1 refills | Status: DC

## 2020-08-14 NOTE — Progress Notes (Signed)
Follow-Up Visit   Subjective  Erica Rivas is a 80 y.o. female who presents for the following: check spots (R arm, x 30m, hx of bleeding/L neck, 56m, hx of draining). She has other areas to be checked especially rough areas on her face . The following portions of the chart were reviewed this encounter and updated as appropriate:   Tobacco  Allergies  Meds  Problems  Med Hx  Surg Hx  Fam Hx     Review of Systems:  No other skin or systemic complaints except as noted in HPI or Assessment and Plan.  Objective  Well appearing patient in no apparent distress; mood and affect are within normal limits.  A focused examination was performed including bil arms, face, neck hands. Relevant physical exam findings are noted in the Assessment and Plan.  Objective  Right Forearm: 3.5 x 3.0cm skin colored nodule       Objective  L neck: Fistula cystic pap  Objective  L cheek x 1: Erythematous keratotic or waxy stuck-on papule or plaque.   Objective  L cheek x 1, bil hands/arms x 20 (20): Pink scaly macules    Assessment & Plan    Actinic Damage - chronic, secondary to cumulative UV radiation exposure/sun exposure over time - diffuse scaly erythematous macules with underlying dyspigmentation - Recommend daily broad spectrum sunscreen SPF 30+ to sun-exposed areas, reapply every 2 hours as needed.  - Recommend staying in the shade or wearing long sleeves, sun glasses (UVA+UVB protection) and wide brim hats (4-inch brim around the entire circumference of the hat). - Call for new or changing lesions.  Neoplasm of skin Right Forearm  Skin excision  Lesion length (cm):  3.5 Lesion width (cm):  3 Margin per side (cm):  0.2 Total excision diameter (cm):  3.9 Informed consent: discussed and consent obtained   Timeout: patient name, date of birth, surgical site, and procedure verified   Procedure prep:  Patient was prepped and draped in usual sterile fashion Prep type:   Isopropyl alcohol Anesthesia: the lesion was anesthetized in a standard fashion   Anesthetic:  1% lidocaine w/ epinephrine 1-100,000 buffered w/ 8.4% NaHCO3 (6cc) Instrument used: #15 blade   Hemostasis achieved with: pressure and aluminum chloride   Hemostasis achieved with comment:  Electrocautery Outcome: patient tolerated procedure well with no complications   Post-procedure details: sterile dressing applied and wound care instructions given   Dressing: Mupirocin.    Destruction of lesion Complexity: extensive   Destruction method: electrodesiccation and curettage   Informed consent: discussed and consent obtained   Timeout:  patient name, date of birth, surgical site, and procedure verified Procedure prep:  Patient was prepped and draped in usual sterile fashion Prep type:  Isopropyl alcohol Anesthesia: the lesion was anesthetized in a standard fashion   Anesthetic:  1% lidocaine w/ epinephrine 1-100,000 buffered w/ 8.4% NaHCO3 Curettage performed in three different directions: Yes   Electrodesiccation performed over the curetted area: Yes   Lesion length (cm):  3.5 Lesion width (cm):  3.2 Margin per side (cm):  0.2 Final wound size (cm):  3.9 Hemostasis achieved with:  pressure, aluminum chloride and electrodesiccation Outcome: patient tolerated procedure well with no complications   Post-procedure details: sterile dressing applied and wound care instructions given   Dressing type: bandage and bacitracin   Additional details:  Start Mupirocin oint qd to wound until healed  mupirocin ointment (BACTROBAN) 2 %  Specimen 1 - Surgical pathology Differential Diagnosis: D48.5 R/O  SCC  Check Margins: yes 3.5 x 3.0cm skin colored nodule EDC today  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  Consider excision later  Inflamed seborrheic keratosis L cheek x 1 Destruction of  lesion - L cheek x 1 Complexity: simple   Destruction method: cryotherapy   Informed consent: discussed and consent obtained   Timeout:  patient name, date of birth, surgical site, and procedure verified Lesion destroyed using liquid nitrogen: Yes   Region frozen until ice ball extended beyond lesion: Yes   Outcome: patient tolerated procedure well with no complications   Post-procedure details: wound care instructions given    AK (actinic keratosis) (20) L cheek x 1, bil hands/arms x 20 Destruction of lesion - L cheek x 1, bil hands/arms x 20 Complexity: simple   Destruction method: cryotherapy   Informed consent: discussed and consent obtained   Timeout:  patient name, date of birth, surgical site, and procedure verified Lesion destroyed using liquid nitrogen: Yes   Region frozen until ice ball extended beyond lesion: Yes   Outcome: patient tolerated procedure well with no complications   Post-procedure details: wound care instructions given    Return in about 4 months (around 12/14/2020) for AK f/u, recheck bx site, .   I, Sonya Hupman, RMA, am acting as scribe for Sarina Ser, MD .  Documentation: I have reviewed the above documentation for accuracy and completeness, and I agree with the above.  Sarina Ser, MD

## 2020-08-14 NOTE — Patient Instructions (Addendum)
If you have any questions or concerns for your doctor, please call our main line at [phone] and press option 4 to reach your doctor's medical assistant. If no one answers, please leave a voicemail as directed and we will return your call as soon as possible. Messages left after 4 pm will be answered the following business day.   You may also send us a message via MyChart. We typically respond to MyChart messages within 1-2 business days.  For prescription refills, please ask your pharmacy to contact our office. Our fax number is [phone].  If you have an urgent issue when the clinic is closed that cannot wait until the next business day, you can page your doctor at the number below.    Please note that while we do our best to be available for urgent issues outside of office hours, we are not available 24/7.   If you have an urgent issue and are unable to reach us, you may choose to seek medical care at your doctor's office, retail clinic, urgent care center, or emergency room.  If you have a medical emergency, please immediately call 911 or go to the emergency department.  Pager Numbers  - Dr. Kowalski: [phone]  - Dr. Moye: [phone]  - Dr. Stewart: [phone]  In the event of inclement weather, please call our main line at [phone] for an update on the status of any delays or closures.  Dermatology Medication Tips: Please keep the boxes that topical medications come in in order to help keep track of the instructions about where and how to use these. Pharmacies typically print the medication instructions only on the boxes and not directly on the medication tubes.   If your medication is too expensive, please contact our office at [phone] option 4 or send us a message through MyChart.   We are unable to tell what your co-pay for medications will be in advance as this is different depending on your insurance coverage. However, we may be able to find a  substitute medication at lower cost or fill out paperwork to get insurance to cover a needed medication.   If a prior authorization is required to get your medication covered by your insurance company, please allow us 1-2 business days to complete this process.  Drug prices often vary depending on where the prescription is filled and some pharmacies may offer cheaper prices.  The website www.goodrx.com contains coupons for medications through different pharmacies. The prices here do not account for what the cost may be with help from insurance (it may be cheaper with your insurance), but the website can give you the price if you did not use any insurance.  - You can print the associated coupon and take it with your prescription to the pharmacy.  - You may also stop by our office during regular business hours and pick up a GoodRx coupon card.  - If you need your prescription sent electronically to a different pharmacy, notify our office through [facility] MyChart or by phone at [phone] option 4.     Wound Care Instructions  1. Cleanse wound gently with soap and water once a day then pat dry with clean gauze. Apply a thing coat of Petrolatum (petroleum jelly, "Vaseline") over the wound (unless you have an allergy to this). We recommend that you use a new, sterile tube of Vaseline. Do not pick or remove scabs. Do not remove the yellow or white "healing tissue" from the base of the wound.    2. Cover the wound with fresh, clean, nonstick gauze and secure with paper tape. You may use Band-Aids in place of gauze and tape if the would is small enough, but would recommend trimming much of the tape off as there is often too much. Sometimes Band-Aids can irritate the skin.  3. You should call the office for your biopsy report after 1 week if you have not already been contacted.  4. If you experience any problems, such as abnormal amounts of bleeding, swelling, significant bruising, significant pain,  or evidence of infection, please call the office immediately.  5. FOR ADULT SURGERY PATIENTS: If you need something for pain relief you may take 1 extra strength Tylenol (acetaminophen) AND 2 Ibuprofen (200mg each) together every 4 hours as needed for pain. (do not take these if you are allergic to them or if you have a reason you should not take them.) Typically, you may only need pain medication for 1 to 3 days.     

## 2020-08-15 ENCOUNTER — Encounter: Payer: Self-pay | Admitting: Dermatology

## 2020-08-20 ENCOUNTER — Telehealth: Payer: Self-pay

## 2020-08-20 NOTE — Telephone Encounter (Signed)
Patient advised of biopsy results by Dr. Nehemiah Massed. Scheduled next Thursday 08/29/20 for follow up with Dr. Nehemiah Massed.

## 2020-08-20 NOTE — Telephone Encounter (Signed)
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 DEEP MARGIN: INVOLVED. ULCERATION: PRESENT. SATELLITOSIS: ABSENT. MITOTIC INDEX: 25/MM2 LYMPHO-VASCULAR INVASION: ABSENT. NEUROTROPISM: ABSENT. TUMOR-INFILTRATING LYMPHOCYTES: BRISK. TUMOR REGRESSION: ABSENT. LYMPH NODES (IF APPLICABLE): N/A PATHOLOGIC STAGE: PT4B  Cancer - Malignant Melanoma - invasive and thick. Additional procedures will be recommended Schedule an appt for patient within the next 2 weeks to discuss diagnosis and recommendations. Discussed briefly with patient by phone today 08/20/20 at 2:45 pm

## 2020-08-21 ENCOUNTER — Ambulatory Visit
Admission: RE | Admit: 2020-08-21 | Discharge: 2020-08-21 | Disposition: A | Discharge status: Home / Self Care | Payer: Medicare Other | Source: Ambulatory Visit | Attending: Oncology | Admitting: Oncology

## 2020-08-21 ENCOUNTER — Other Ambulatory Visit: Payer: Self-pay

## 2020-08-21 DIAGNOSIS — F17200 Nicotine dependence, unspecified, uncomplicated: Secondary | ICD-10-CM | POA: Diagnosis present

## 2020-08-21 DIAGNOSIS — F17210 Nicotine dependence, cigarettes, uncomplicated: Secondary | ICD-10-CM | POA: Insufficient documentation

## 2020-08-21 DIAGNOSIS — I700 Atherosclerosis of aorta: Secondary | ICD-10-CM | POA: Diagnosis not present

## 2020-08-21 DIAGNOSIS — I2510 Atherosclerotic heart disease of native coronary artery without angina pectoris: Secondary | ICD-10-CM | POA: Insufficient documentation

## 2020-08-21 DIAGNOSIS — R911 Solitary pulmonary nodule: Secondary | ICD-10-CM | POA: Insufficient documentation

## 2020-08-21 DIAGNOSIS — Z122 Encounter for screening for malignant neoplasm of respiratory organs: Secondary | ICD-10-CM | POA: Diagnosis not present

## 2020-08-21 DIAGNOSIS — Z87891 Personal history of nicotine dependence: Secondary | ICD-10-CM

## 2020-08-21 DIAGNOSIS — J432 Centrilobular emphysema: Secondary | ICD-10-CM | POA: Diagnosis not present

## 2020-08-28 ENCOUNTER — Telehealth: Payer: Self-pay | Admitting: *Deleted

## 2020-08-28 DIAGNOSIS — Z87891 Personal history of nicotine dependence: Secondary | ICD-10-CM

## 2020-08-28 DIAGNOSIS — R918 Other nonspecific abnormal finding of lung field: Secondary | ICD-10-CM

## 2020-08-28 NOTE — Telephone Encounter (Signed)
Notified patient of LDCT lung cancer screening program results with recommendation for 3 month follow up imaging. Also notified of incidental findings noted below and is encouraged to discuss further with PCP who will receive a copy of this note and/or the CT report. Patient verbalizes understanding. Patient is scheduled for the 3 month follow up scan.   IMPRESSION: 1. New left lower lobe pulmonary nodule categorized as Lung-RADS 4AS, suspicious. Follow up low-dose chest CT without contrast in 3 months (please use the following order, "CT CHEST LCS NODULE FOLLOW-UP W/O CM") is recommended. 2. The "S" modifier above refers to potentially clinically significant non lung cancer related findings. Specifically, there is aortic atherosclerosis, in addition to left main and 3 vessel coronary artery disease. Assessment for potential risk factor modification, dietary therapy or pharmacologic therapy may be warranted, if clinically indicated. 3. Mild diffuse bronchial wall thickening with mild centrilobular and paraseptal emphysema; imaging findings suggestive of underlying COPD. 4. There are calcifications of the aortic valve. Echocardiographic correlation for evaluation of potential valvular dysfunction may be warranted if clinically indicated.  Aortic Atherosclerosis (ICD10-I70.0) and Emphysema (ICD10-J43.9).

## 2020-08-29 ENCOUNTER — Ambulatory Visit (INDEPENDENT_AMBULATORY_CARE_PROVIDER_SITE_OTHER): Payer: Medicare Other | Admitting: Dermatology

## 2020-08-29 ENCOUNTER — Other Ambulatory Visit: Payer: Self-pay

## 2020-08-29 ENCOUNTER — Encounter: Payer: Self-pay | Admitting: Dermatology

## 2020-08-29 DIAGNOSIS — C4361 Malignant melanoma of right upper limb, including shoulder: Secondary | ICD-10-CM

## 2020-08-29 DIAGNOSIS — C439 Malignant melanoma of skin, unspecified: Secondary | ICD-10-CM

## 2020-08-29 NOTE — Patient Instructions (Signed)
If you have any questions or concerns for your doctor, please call our main line at [phone] and press option 4 to reach your doctor's medical assistant. If no one answers, please leave a voicemail as directed and we will return your call as soon as possible. Messages left after 4 pm will be answered the following business day.   You may also send [place] a message via [place]. We typically respond to MyChart messages within 1-2 business days.  For prescription refills, please ask your pharmacy to contact our office. Our fax number is [phone].  If you have an urgent issue when the clinic is closed that cannot wait until the next business day, you can page your doctor at the number below.    Please note that while we do our best to be available for urgent issues outside of office hours, we are not available 24/7.   If you have an urgent issue and are unable to reach [place], you may choose to seek medical care at your doctor's office, retail clinic, urgent care center, or emergency room.  If you have a medical emergency, please immediately call 911 or go to the emergency department.  Pager Numbers  - Dr. Nehemiah Massed: [phone]  - Dr. Laurence Ferrari: [phone]  - Dr. Nicole Kindred: [phone]  In the event of inclement weather, please call our main line at [phone] for an update on the status of any delays or closures.  Dermatology Medication Tips: Please keep the boxes that topical medications come in in order to help keep track of the instructions about where and how to use these. Pharmacies typically print the medication instructions only on the boxes and not directly on the medication tubes.   If your medication is too expensive, please contact our office at [phone] option 4 or send [place] a message through [place].   We are unable to tell what your co-pay for medications will be in advance as this is different depending on your insurance coverage. However, we may be able to find a substitute  medication at lower cost or fill out paperwork to get insurance to cover a needed medication.   If a prior authorization is required to get your medication covered by your insurance company, please allow [place] 1-2 business days to complete this process.  Drug prices often vary depending on where the prescription is filled and some pharmacies may offer cheaper prices.  The website www.goodrx.com contains coupons for medications through different pharmacies. The prices here do not account for what the cost may be with help from insurance (it may be cheaper with your insurance), but the website can give you the price if you did not use any insurance.  - You can print the associated coupon and take it with your prescription to the pharmacy.  - You may also stop by our office during regular business hours and pick up a GoodRx coupon card.  - If you need your prescription sent electronically to a different pharmacy, notify our office through [facility] or by phone at [phone] option 4.    Pre-Operative Instructions  You are scheduled for a surgical procedure at [facility]. We recommend you read the following instructions. If you have any questions or concerns, please call the office at [phone].  1. Shower and wash the entire body with soap and water the day of your surgery paying special attention to cleansing at and around the planned surgery site.  2. Avoid aspirin or aspirin containing products at least  fourteen (14) days prior to your surgical procedure and for at least one week (7 Days) after your surgical procedure. If you take aspirin on a regular basis for heart disease or history of stroke or for any other reason, we may recommend you continue taking aspirin but please notify us if you take this on a regular basis. Aspirin can cause more bleeding to occur during surgery as well as prolonged bleeding and bruising after surgery.   3. Avoid other nonsteroidal pain  medications at least one week prior to surgery and at least one week prior to your surgery. These include medications such as Ibuprofen (Motrin, Advil and Nuprin), Naprosyn, Voltaren, Relafen, etc. If medications are used for therapeutic reasons, please inform us as they can cause increased bleeding or prolonged bleeding during and bruising after surgical procedures.   4. Please advise [place] if you are taking any "blood thinner" medications such as Coumadin or Dipyridamole or Plavix or similar medications. These cause increased bleeding and prolonged bleeding during procedures and bruising after surgical procedures. We may have to consider discontinuing these medications briefly prior to and shortly after your surgery if safe to do so.   5. Please inform us of all medications you are currently taking. All medications that are taken regularly should be taken the day of surgery as you always do. Nevertheless, we need to be informed of what medications you are taking prior to surgery to know whether they will affect the procedure or cause any complications.   6. Please inform us of any medication allergies. Also inform us of whether you have allergies to Latex or rubber products or whether you have had any adverse reaction to Lidocaine or Epinephrine.  7. Please inform us of any prosthetic or artificial body parts such as artificial heart valve, joint replacements, etc., or similar condition that might require preoperative antibiotics.   8. We recommend avoidance of alcohol at least two weeks prior to surgery and continued avoidance for at least two weeks after surgery.   9. We recommend discontinuation of tobacco smoking at least two weeks prior to surgery and continued abstinence for at least two weeks after surgery.  10. Do not plan strenuous exercise, strenuous work or strenuous lifting for approximately four weeks after your surgery.   11. We request if you are unable to make your scheduled surgical  appointment, please call us at least a week in advance or as soon as you are aware of a problem so that we can cancel or reschedule the appointment.   12. You MAY TAKE TYLENOL (acetaminophen) for pain as it is not a blood thinner.   13. PLEASE PLAN TO BE IN TOWN FOR TWO WEEKS FOLLOWING SURGERY, THIS IS IMPORTANT SO YOU CAN BE CHECKED FOR DRESSING CHANGES, SUTURE REMOVAL AND TO MONITOR FOR POSSIBLE COMPLICATIONS.

## 2020-08-29 NOTE — Progress Notes (Signed)
   Follow-Up Visit   Subjective  Erica Rivas is a 80 y.o. female who presents for the following: Discuss treatment options for MM  (R forearm - bx proven, patient is here today to discuss diagnosis and tx options).  The following portions of the chart were reviewed this encounter and updated as appropriate:   Tobacco  Allergies  Meds  Problems  Med Hx  Surg Hx  Fam Hx     Review of Systems:  No other skin or systemic complaints except as noted in HPI or Assessment and Plan.  Objective  Well appearing patient in no apparent distress; mood and affect are within normal limits.  A focused examination was performed including the right forearm, axilla, and neck. Relevant physical exam findings are noted in the Assessment and Plan.  Objective  R forearm: Healing ulceration. No LAD of elbow, axilla, or neck.  Assessment & Plan  Melanoma of skin ([place]) R forearm Diagnosis Skin , right forearm MALIGNANT MELANOMA MELANOMA TABLE (AJCC 8TH EDITION*) PROCEDURE: SPECIMEN ANATOMIC SITE: RIGHT FOREARM HISTOLOGIC TYPE: NODULAR BRESLOW'S DEPTH/MAXIMUM TUMOR THICKNESS: AT LEAST 10.00 MM CLARK/ANATOMIC LEVEL: AT LEAST IV MARGINS PERIPHERAL MARGINS: INVOLVED. DEEP MARGIN: INVOLVED. ULCERATION: PRESENT. SATELLITOSIS: ABSENT. MITOTIC INDEX: 25/MM2 LYMPHO-VASCULAR INVASION: ABSENT. NEUROTROPISM: ABSENT. TUMOR-INFILTRATING LYMPHOCYTES: BRISK. TUMOR REGRESSION: ABSENT. LYMPH NODES (IF APPLICABLE): N/A PATHOLOGIC STAGE: PT4B  Ambulatory referral to [facility] - Dr Molli Hazard al.  No neck; axillary or antecubital Lymphadenopathy today.  Discussed diagnosis in detail including significance of melanoma diagnosis which can be potentially lethal.  Discussed treatment recommendations in detail advising that treatment recommendations are based on longitudinal studies and retrospective studies and are nationwide protocols.  Advised there is always potential for recurrence even  after definitive treatment.  After definitive treatment, we recommend total-body skin exams every 3 months for a year; then every 4 months for a year; then every 6 months for 3 years.  At 5 years post treatment, if all looks good we would recommend at least yearly total-body skin exams for the rest of your life.  The patient was given time for questions and these were answered.  We recommend frequent self skin examinations; photoprotection with sunscreen, sun protective clothing, hats, sunglasses and sun avoidance.  If the patient notices any new or changing skin lesions the patient should return to the office immediately for evaluation.   Continue Mupirocin 2% ointment daily with wound dressing.  Changed dressing today.  Will refer to [facility] for [facility] and additional lymph node studies.  Return for appointment as scheduled.  Luther Redo, CMA, am acting as scribe for Sarina Ser, MD .  Documentation: I have reviewed the above documentation for accuracy and completeness, and I agree with the above.  Sarina Ser, MD

## 2020-09-03 ENCOUNTER — Other Ambulatory Visit: Payer: Self-pay | Admitting: Family Medicine

## 2020-09-03 DIAGNOSIS — I6521 Occlusion and stenosis of right carotid artery: Secondary | ICD-10-CM

## 2020-09-03 DIAGNOSIS — I10 Essential (primary) hypertension: Secondary | ICD-10-CM

## 2020-09-05 DIAGNOSIS — D485 Neoplasm of uncertain behavior of skin: Secondary | ICD-10-CM | POA: Diagnosis not present

## 2020-09-11 DIAGNOSIS — C4361 Malignant melanoma of right upper limb, including shoulder: Secondary | ICD-10-CM | POA: Insufficient documentation

## 2020-09-11 DIAGNOSIS — R911 Solitary pulmonary nodule: Secondary | ICD-10-CM | POA: Diagnosis not present

## 2020-09-17 DIAGNOSIS — Z7984 Long term (current) use of oral hypoglycemic drugs: Secondary | ICD-10-CM | POA: Diagnosis not present

## 2020-09-17 DIAGNOSIS — Z972 Presence of dental prosthetic device (complete) (partial): Secondary | ICD-10-CM | POA: Diagnosis not present

## 2020-09-17 DIAGNOSIS — J449 Chronic obstructive pulmonary disease, unspecified: Secondary | ICD-10-CM | POA: Diagnosis not present

## 2020-09-17 DIAGNOSIS — I10 Essential (primary) hypertension: Secondary | ICD-10-CM | POA: Diagnosis not present

## 2020-09-17 DIAGNOSIS — E785 Hyperlipidemia, unspecified: Secondary | ICD-10-CM | POA: Diagnosis not present

## 2020-09-17 DIAGNOSIS — K219 Gastro-esophageal reflux disease without esophagitis: Secondary | ICD-10-CM | POA: Diagnosis not present

## 2020-09-17 DIAGNOSIS — C4361 Malignant melanoma of right upper limb, including shoulder: Secondary | ICD-10-CM | POA: Diagnosis not present

## 2020-09-17 DIAGNOSIS — E119 Type 2 diabetes mellitus without complications: Secondary | ICD-10-CM | POA: Diagnosis not present

## 2020-09-17 DIAGNOSIS — F17210 Nicotine dependence, cigarettes, uncomplicated: Secondary | ICD-10-CM | POA: Diagnosis not present

## 2020-09-17 DIAGNOSIS — Z7902 Long term (current) use of antithrombotics/antiplatelets: Secondary | ICD-10-CM | POA: Diagnosis not present

## 2020-09-17 DIAGNOSIS — Z7982 Long term (current) use of aspirin: Secondary | ICD-10-CM | POA: Diagnosis not present

## 2020-09-17 DIAGNOSIS — Z8679 Personal history of other diseases of the circulatory system: Secondary | ICD-10-CM | POA: Diagnosis not present

## 2020-09-17 DIAGNOSIS — R0600 Dyspnea, unspecified: Secondary | ICD-10-CM | POA: Diagnosis not present

## 2020-09-17 DIAGNOSIS — Z7951 Long term (current) use of inhaled steroids: Secondary | ICD-10-CM | POA: Diagnosis not present

## 2020-09-17 DIAGNOSIS — Z9862 Peripheral vascular angioplasty status: Secondary | ICD-10-CM | POA: Diagnosis not present

## 2020-09-17 HISTORY — PX: MELANOMA EXCISION: SHX5266

## 2020-09-19 ENCOUNTER — Encounter: Payer: Self-pay | Admitting: Family Medicine

## 2020-09-19 ENCOUNTER — Other Ambulatory Visit (INDEPENDENT_AMBULATORY_CARE_PROVIDER_SITE_OTHER): Payer: Self-pay | Admitting: Nurse Practitioner

## 2020-09-19 DIAGNOSIS — I6523 Occlusion and stenosis of bilateral carotid arteries: Secondary | ICD-10-CM

## 2020-09-20 ENCOUNTER — Ambulatory Visit (INDEPENDENT_AMBULATORY_CARE_PROVIDER_SITE_OTHER): Payer: Medicare Other | Admitting: Vascular Surgery

## 2020-09-20 ENCOUNTER — Other Ambulatory Visit: Payer: Self-pay

## 2020-09-20 ENCOUNTER — Ambulatory Visit (INDEPENDENT_AMBULATORY_CARE_PROVIDER_SITE_OTHER): Payer: Medicare Other

## 2020-09-20 VITALS — BP 107/60 | HR 80 | Ht 66.0 in | Wt 159.0 lb

## 2020-09-20 DIAGNOSIS — I6523 Occlusion and stenosis of bilateral carotid arteries: Secondary | ICD-10-CM

## 2020-09-20 DIAGNOSIS — I10 Essential (primary) hypertension: Secondary | ICD-10-CM | POA: Diagnosis not present

## 2020-09-20 DIAGNOSIS — E1140 Type 2 diabetes mellitus with diabetic neuropathy, unspecified: Secondary | ICD-10-CM | POA: Diagnosis not present

## 2020-09-20 NOTE — Progress Notes (Signed)
MRN : [id number]  Erica Rivas is a 80 y.o. ([birth year]) female who presents with chief complaint of  Chief Complaint  Patient presents with  . Follow-up    6 MO Carotid  . Carotid  .  History of Present Illness: Patient returns in follow-up of her carotid disease.  She is doing well today.  She has no specific complaints.  She continues on aspirin and Plavix.  She is almost 1 year status post right carotid artery stent placement for high-grade stenosis.  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  Current Outpatient Medications  Medication Sig Dispense Refill  . albuterol (PROAIR HFA) 108 (90 Base) MCG/ACT inhaler INHALE TWO PUFFS BY MOUTH EVERY 6 HOURS AS NEEDED FOR WHEEZING OR  SHORTNESS  OF  BREATH 18 g 3  . amitriptyline (ELAVIL) 25 MG tablet TAKE 1 TABLET BY MOUTH AT  BEDTIME 90 tablet 1  . amLODipine (NORVASC) 5 MG tablet TAKE 1 TABLET BY MOUTH  DAILY 90 tablet 1  . aspirin EC 81 MG tablet Take 81 mg by mouth daily.    Marland Kitchen atenolol (TENORMIN) 50 MG tablet TAKE 1 TABLET BY MOUTH  TWICE DAILY 180 tablet 1  . atorvastatin (LIPITOR) 80 MG tablet TAKE 1 TABLET BY MOUTH  DAILY 90 tablet 3  . Cholecalciferol (VITAMIN D) 2000 UNITS tablet Take 1,000 Units by mouth daily.     . citalopram (CELEXA) 20 MG tablet Take by mouth.    . clopidogrel (PLAVIX) 75 MG tablet Take 1 tablet (75 mg total) by mouth daily. 30 tablet 6  . enalapril (VASOTEC) 2.5 MG tablet Take by mouth.    . escitalopram (LEXAPRO) 10 MG tablet TAKE 1 TABLET BY MOUTH  DAILY 90 tablet 3  . furosemide (LASIX) 20 MG tablet Take 1 tablet (20 mg total) by mouth daily as needed (swelling). 20 tablet 4  . glipiZIDE (GLUCOTROL XL) 10 MG 24 hr tablet TAKE 1 TABLET BY MOUTH  DAILY 90 tablet 1  . lisinopril-hydrochlorothiazide (ZESTORETIC) 20-25 MG tablet TAKE 1 TABLET BY MOUTH  DAILY 90 tablet 1  . metFORMIN (GLUCOPHAGE) 1000 MG tablet TAKE 1 TABLET BY MOUTH  TWICE DAILY 180 tablet  1  . montelukast (SINGULAIR) 10 MG tablet TAKE 1 TABLET BY MOUTH  DAILY 90 tablet 2  . mupirocin ointment (BACTROBAN) 2 % Apply 1 application topically daily. Qd to excision site on right arm until healed 22 g 1  . omeprazole (PRILOSEC) 40 MG capsule TAKE 1 CAPSULE BY MOUTH  DAILY 90 capsule 1  . ondansetron (ZOFRAN-ODT) 4 MG disintegrating tablet Take by mouth.    Glory Rosebush Delica Lancets 10F MISC USE TO CHECK BLOOD SUGAR DAILY 100 each 3  . ONETOUCH ULTRA test strip USE TO TEST BLOOD SUGAR DAILY. 50 each 0  . oxyCODONE (OXY IR/ROXICODONE) 5 MG immediate release tablet Take by mouth.    . pravastatin (PRAVACHOL) 40 MG tablet Take by mouth.    . SYMBICORT 160-4.5 MCG/ACT inhaler Inhale 2 puffs into the lungs 2 (two) times daily. 3 each 4  . valACYclovir (VALTREX) 1000 MG tablet 2 tablets twice a day for 1 day as needed for cold sores 12 tablet 2   No current facility-administered medications for this visit.    Past Medical History:  Diagnosis Date  . Actinic keratosis   . Anemia   . Colon polyps   . COPD (chronic obstructive pulmonary disease) ([place])   .  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 Diabetes mellitus without complication ([place])   . Hemorrhoids   . History of chicken pox   . History of measles   . History of mumps   . Lung nodule 02/18/2015   No additional follow up needed per CT report 03/05/2015   . Melanoma ([place]) 08/14/2020   right forearm - not yet treated  . Squamous cell carcinoma of skin 09/29/2017   L pretibial - ED&C   . Squamous cell carcinoma of skin 05/23/2019   L calf - ED&C     Past Surgical History:  Procedure Laterality Date  . ABDOMINAL HYSTERECTOMY  1985   DUB; ovaries resected/ removed  . BREAST BIOPSY Right 11/12/2014   fibroadenomatous  . CARDIOVASCULAR STRESS TEST  02/2008   low risk  . CAROTID PTA/STENT INTERVENTION Right 11/13/2019   Procedure: CAROTID PTA/STENT INTERVENTION;  Surgeon: Algernon Huxley, MD;  Location: [facility];  Service:  Cardiovascular;  Laterality: Right;  . COLONOSCOPY WITH PROPOFOL N/A 01/18/2018   Procedure: COLONOSCOPY WITH PROPOFOL;  Surgeon: Lin Landsman, MD;  Location: [facility] ENDOSCOPY;  Service: Gastroenterology;  Laterality: N/A;  . DOPPLER ECHOCARDIOGRAPHY  01/2010   EF>55%, LV relaxation impairment consistent with diastolic dysfunction, mild TR and MR  . ESOPHAGOGASTRODUODENOSCOPY  07/2009   +diffuse gastritis. Iftikhar  . ESOPHAGOGASTRODUODENOSCOPY (EGD) WITH PROPOFOL N/A 01/18/2018   Procedure: ESOPHAGOGASTRODUODENOSCOPY (EGD) WITH PROPOFOL;  Surgeon: Lin Landsman, MD;  Location: [place];  Service: Gastroenterology;  Laterality: N/A;  . GALLBLADDER SURGERY  1994  . HAND SURGERY Right 2010   surgery x 2. Dr. Sabra Heck  . MELANOMA EXCISION Right 09/17/2020   Wide excision r forearm Dr. Arrie Aran, [facility]  . NECK SURGERY  1998   Disc surgey on neck  . Sinus Surgery    . TUBAL LIGATION  1971     Social History   Tobacco Use  . Smoking status: Current Every Day Smoker    Packs/day: 0.75    Years: 50.00    Pack years: 37.50    Types: Cigarettes  . Smokeless tobacco: Never Used  Vaping Use  . Vaping Use: Never used  Substance Use Topics  . Alcohol use: No    Alcohol/week: 0.0 standard drinks  . Drug use: No       Family History  Problem Relation Age of Onset  . Hypertension Sister   . COPD Sister   . Thyroid disease Sister   . Breast cancer Sister 6  . Thyroid disease Sister   . Heart attack Mother   . Hypertension Mother   . Thyroid disease Mother        HYPERTHYROIDISM  . GI Bleed Father   . Lung cancer Brother   . Breast cancer Sister   . Hypertension Sister   . Aneurysm Sister        in the back of her neck  . Heart attack Brother        CABG     No Known Allergies   REVIEW OF SYSTEMS (Negative unless checked)  Constitutional: [] Weight loss  [] Fever  [] Chills Cardiac: [] Chest pain   [] Chest pressure   [] Palpitations    [] Shortness of breath when laying flat   [] Shortness of breath at rest   [] Shortness of breath with exertion. Vascular:  [] Pain in legs with walking   [] Pain in legs at rest   [] Pain in legs when laying flat   [] Claudication   [] Pain in feet when  walking  [] Pain in feet at rest  [] Pain in feet when laying flat   [] History of DVT   [] Phlebitis   [] Swelling in legs   [] Varicose veins   [] Non-healing ulcers Pulmonary:   [] Uses home oxygen   [] Productive cough   [] Hemoptysis   [] Wheeze  [x] COPD   [x] Asthma Neurologic:  [] Dizziness  [] Blackouts   [] Seizures   [] History of stroke   [] History of TIA  [] Aphasia   [] Temporary blindness   [] Dysphagia   [] Weakness or numbness in arms   [] Weakness or numbness in legs Musculoskeletal:  [x] Arthritis   [] Joint swelling   [] Joint pain   [] Low back pain Hematologic:  [] Easy bruising  [] Easy bleeding   [] Hypercoagulable state   [x] Anemic  [] Hepatitis Gastrointestinal:  [] Blood in stool   [] Vomiting blood  [] Gastroesophageal reflux/heartburn   [] Difficulty swallowing. Genitourinary:  [] Chronic kidney disease   [] Difficult urination  [] Frequent urination  [] Burning with urination   [] Blood in urine Skin:  [] Rashes   [] Ulcers   [] Wounds Psychological:  [] History of anxiety   []  History of major depression.  Physical Examination  Vitals:   09/20/20 1113  BP: 107/60  Pulse: 80  Weight: 159 lb (72.1 kg)  Height: 5\' 6"  (1.676 m)   Body mass index is 25.66 kg/m. Gen:  WD/WN, NAD Head: [place]/AT, No temporalis wasting. Ear/Nose/Throat: Hearing grossly intact, nares w/o erythema or drainage, trachea midline Eyes: Conjunctiva clear. Sclera non-icteric Neck: Supple.  No bruit  Pulmonary:  Good air movement, equal and clear to auscultation bilaterally.  Cardiac: RRR, No JVD Vascular:  Vessel Right Left  Radial Palpable Palpable           Musculoskeletal: M/S 5/5 throughout.  No deformity or atrophy. No edema. Neurologic: CN 2-12 intact. Sensation grossly intact in  extremities.  Symmetrical.  Speech is fluent. Motor exam as listed above. Psychiatric: Judgment intact, Mood & affect appropriate for pt's clinical situation. Dermatologic: No rashes or ulcers noted.  No cellulitis or open wounds. Lymph : No Cervical, Axillary, or Inguinal lymphadenopathy.     CBC Lab Results  Component Value Date   WBC 14.0 (H) 08/08/2020   HGB 10.1 (L) 08/08/2020   HCT 31.5 (L) 08/08/2020   MCV 82 08/08/2020   PLT 300 08/08/2020    BMET    Component Value Date/Time   NA 135 08/08/2020 0858   K 4.9 08/08/2020 0858   CL 94 (L) 08/08/2020 0858   CO2 24 08/08/2020 0858   GLUCOSE 121 (H) 08/08/2020 0858   GLUCOSE 101 (H) 11/14/2019 0419   BUN 18 08/08/2020 0858   CREATININE 1.41 (H) 08/08/2020 0858   CALCIUM 9.9 08/08/2020 0858   GFRNONAA 39 (L) 11/14/2019 0419   GFRAA 45 (L) 11/14/2019 0419   CrCl cannot be calculated (Patient's most recent lab result is older than the maximum 21 days allowed.).  COAG No results found for: INR, PROTIME  Radiology VAS US CAROTID  Result Date: 09/20/2020 Carotid Arterial Duplex Study Patient Name:  CORRINE TILLIS  Date of Exam:   09/20/2020 Medical Rec #: [id number]       Accession #:    [id number] Date of Birth: 1940-07-27       Patient Gender: F Patient Age:   35Y Exam Location:  [facility] Procedure:      VAS US CAROTID Referring Phys: [id number] Kris Hartmann --------------------------------------------------------------------------------  Indications:       Carotid artery disease. Other Factors:     11/13/2019  Rt IcA stent. Comparison Study:  03/20/2020 Performing Technologist: Almira Coaster RVS  Examination Guidelines: A complete evaluation includes B-mode imaging, spectral Doppler, color Doppler, and power Doppler as needed of all accessible portions of each vessel. Bilateral testing is considered an integral part of a complete examination. Limited examinations for reoccurring indications may be performed as  noted.  Right Carotid Findings: +----------+--------+--------+--------+------------------+--------+           PSV cm/sEDV cm/sStenosisPlaque DescriptionComments +----------+--------+--------+--------+------------------+--------+ CCA Prox  49      9                                          +----------+--------+--------+--------+------------------+--------+ CCA Mid   60      13                                         +----------+--------+--------+--------+------------------+--------+ CCA Distal64      16                                         +----------+--------+--------+--------+------------------+--------+ ICA Prox  107     28                                         +----------+--------+--------+--------+------------------+--------+ ICA Mid   104     29                                         +----------+--------+--------+--------+------------------+--------+ ICA Distal102     28                                         +----------+--------+--------+--------+------------------+--------+ ECA       189     11                                         +----------+--------+--------+--------+------------------+--------+ +----------+--------+-------+--------+-------------------+           PSV cm/sEDV cmsDescribeArm Pressure (mmHG) +----------+--------+-------+--------+-------------------+ Subclavian130     0                                  +----------+--------+-------+--------+-------------------+ +---------+--------+--+--------+--+ VertebralPSV cm/s51EDV cm/s13 +---------+--------+--+--------+--+  Left Carotid Findings: +----------+-------+-------+--------+---------------------------------+--------+           PSV    EDV    StenosisPlaque Description               Comments           cm/s   cm/s                                                     +----------+-------+-------+--------+---------------------------------+--------+  CCA Prox   113    25                                                       +----------+-------+-------+--------+---------------------------------+--------+ CCA Mid   85     19                                                       +----------+-------+-------+--------+---------------------------------+--------+ CCA Distal89     18                                                       +----------+-------+-------+--------+---------------------------------+--------+ ICA Prox  83     18             heterogenous, irregular and                                               calcific                                  +----------+-------+-------+--------+---------------------------------+--------+ ICA Mid   86     23             heterogenous, irregular and                                               calcific                                  +----------+-------+-------+--------+---------------------------------+--------+ ICA Distal75     25                                                       +----------+-------+-------+--------+---------------------------------+--------+ ECA       132    11                                                       +----------+-------+-------+--------+---------------------------------+--------+ +----------+--------+--------+--------+-------------------+           PSV cm/sEDV cm/sDescribeArm Pressure (mmHG) +----------+--------+--------+--------+-------------------+ [id number]     0                                   +----------+--------+--------+--------+-------------------+ +---------+--------+--+--------+--+ VertebralPSV cm/s78EDV cm/s16 +---------+--------+--+--------+--+   Summary: Right Carotid: Velocities  in the right ICA are consistent with a 1-39% stenosis. Left Carotid: Velocities in the left ICA are consistent with a 1-39% stenosis. Vertebrals:  Bilateral vertebral arteries demonstrate antegrade flow. Subclavians:  Normal flow hemodynamics were seen in bilateral subclavian              arteries. *See table(s) above for measurements and observations.  Electronically signed by Leotis Pain MD on 09/20/2020 at 11:13:43 AM.    Final      Assessment/Plan Carotid stenosis Duplex today shows a widely patent right carotid artery stent in 1 to 39% left ICA stenosis.  She is doing well almost 1 year status post stent placement.  It is okay if she comes off of Plavix at this point just as aspirin.  We will plan on seeing her back on an annual basis at this point or sooner if problems develop in the interim.  Essential hypertension blood pressure control important in reducing the progression of atherosclerotic disease. On appropriate oral medications.   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 Also, involved in wound healing. On appropriate medications.     Leotis Pain, MD  09/20/2020 1:59 PM    This note was created with Dragon medical transcription system.  Any errors from dictation are purely unintentional

## 2020-09-20 NOTE — Assessment & Plan Note (Signed)
blood glucose control important in reducing the progression of atherosclerotic disease. Also, involved in wound healing. On appropriate medications.  

## 2020-09-20 NOTE — Assessment & Plan Note (Signed)
blood pressure control important in reducing the progression of atherosclerotic disease. On appropriate oral medications.  

## 2020-09-20 NOTE — Assessment & Plan Note (Signed)
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  She is doing well almost 1 year status post stent placement.  It is okay if she comes off of Plavix at this point just as aspirin.  We will plan on seeing her back on an annual basis at this point or sooner if problems develop in the interim.

## 2020-09-23 DIAGNOSIS — T797XXA Traumatic subcutaneous emphysema, initial encounter: Secondary | ICD-10-CM | POA: Diagnosis not present

## 2020-09-23 DIAGNOSIS — R918 Other nonspecific abnormal finding of lung field: Secondary | ICD-10-CM | POA: Diagnosis not present

## 2020-09-23 DIAGNOSIS — C4361 Malignant melanoma of right upper limb, including shoulder: Secondary | ICD-10-CM | POA: Diagnosis not present

## 2020-09-23 DIAGNOSIS — R911 Solitary pulmonary nodule: Secondary | ICD-10-CM | POA: Diagnosis not present

## 2020-09-25 DIAGNOSIS — C4361 Malignant melanoma of right upper limb, including shoulder: Secondary | ICD-10-CM | POA: Diagnosis not present

## 2020-09-25 DIAGNOSIS — Z09 Encounter for follow-up examination after completed treatment for conditions other than malignant neoplasm: Secondary | ICD-10-CM | POA: Diagnosis not present

## 2020-09-26 DIAGNOSIS — C4361 Malignant melanoma of right upper limb, including shoulder: Secondary | ICD-10-CM | POA: Diagnosis not present

## 2020-09-26 DIAGNOSIS — K529 Noninfective gastroenteritis and colitis, unspecified: Secondary | ICD-10-CM | POA: Diagnosis not present

## 2020-09-26 DIAGNOSIS — R911 Solitary pulmonary nodule: Secondary | ICD-10-CM | POA: Diagnosis not present

## 2020-09-26 DIAGNOSIS — J449 Chronic obstructive pulmonary disease, unspecified: Secondary | ICD-10-CM | POA: Diagnosis not present

## 2020-09-30 ENCOUNTER — Encounter: Payer: Self-pay | Admitting: Family Medicine

## 2020-09-30 DIAGNOSIS — C779 Secondary and unspecified malignant neoplasm of lymph node, unspecified: Secondary | ICD-10-CM | POA: Insufficient documentation

## 2020-10-03 DIAGNOSIS — C4361 Malignant melanoma of right upper limb, including shoulder: Secondary | ICD-10-CM | POA: Diagnosis not present

## 2020-10-03 DIAGNOSIS — Z09 Encounter for follow-up examination after completed treatment for conditions other than malignant neoplasm: Secondary | ICD-10-CM | POA: Diagnosis not present

## 2020-10-07 ENCOUNTER — Other Ambulatory Visit: Payer: Self-pay | Admitting: Family Medicine

## 2020-10-07 DIAGNOSIS — J432 Centrilobular emphysema: Secondary | ICD-10-CM

## 2020-10-07 NOTE — Telephone Encounter (Signed)
Requested medications are due for refill today.  yes  Requested medications are on the active medications list.  yes  Last refill. 09/25/2019  Future visit scheduled.   yes  Notes to clinic.  Prescription is expired.

## 2020-10-10 DIAGNOSIS — C4361 Malignant melanoma of right upper limb, including shoulder: Secondary | ICD-10-CM | POA: Diagnosis not present

## 2020-10-10 DIAGNOSIS — R9089 Other abnormal findings on diagnostic imaging of central nervous system: Secondary | ICD-10-CM | POA: Diagnosis not present

## 2020-10-10 DIAGNOSIS — R9082 White matter disease, unspecified: Secondary | ICD-10-CM | POA: Diagnosis not present

## 2020-10-21 DIAGNOSIS — C4361 Malignant melanoma of right upper limb, including shoulder: Secondary | ICD-10-CM | POA: Diagnosis not present

## 2020-10-23 DIAGNOSIS — R911 Solitary pulmonary nodule: Secondary | ICD-10-CM | POA: Diagnosis not present

## 2020-10-23 DIAGNOSIS — J449 Chronic obstructive pulmonary disease, unspecified: Secondary | ICD-10-CM | POA: Diagnosis not present

## 2020-10-23 DIAGNOSIS — C4361 Malignant melanoma of right upper limb, including shoulder: Secondary | ICD-10-CM | POA: Diagnosis not present

## 2020-11-01 DIAGNOSIS — Z7984 Long term (current) use of oral hypoglycemic drugs: Secondary | ICD-10-CM | POA: Diagnosis not present

## 2020-11-01 DIAGNOSIS — Z87891 Personal history of nicotine dependence: Secondary | ICD-10-CM | POA: Diagnosis not present

## 2020-11-01 DIAGNOSIS — C4361 Malignant melanoma of right upper limb, including shoulder: Secondary | ICD-10-CM | POA: Diagnosis not present

## 2020-11-01 DIAGNOSIS — J449 Chronic obstructive pulmonary disease, unspecified: Secondary | ICD-10-CM | POA: Diagnosis not present

## 2020-11-01 DIAGNOSIS — Z7982 Long term (current) use of aspirin: Secondary | ICD-10-CM | POA: Diagnosis not present

## 2020-11-01 DIAGNOSIS — Z79899 Other long term (current) drug therapy: Secondary | ICD-10-CM | POA: Diagnosis not present

## 2020-11-01 DIAGNOSIS — R911 Solitary pulmonary nodule: Secondary | ICD-10-CM | POA: Diagnosis not present

## 2020-11-01 DIAGNOSIS — I10 Essential (primary) hypertension: Secondary | ICD-10-CM | POA: Diagnosis not present

## 2020-11-01 DIAGNOSIS — E7800 Pure hypercholesterolemia, unspecified: Secondary | ICD-10-CM | POA: Diagnosis not present

## 2020-11-23 ENCOUNTER — Other Ambulatory Visit: Payer: Self-pay | Admitting: Family Medicine

## 2020-11-23 DIAGNOSIS — K219 Gastro-esophageal reflux disease without esophagitis: Secondary | ICD-10-CM

## 2020-11-23 DIAGNOSIS — J301 Allergic rhinitis due to pollen: Secondary | ICD-10-CM

## 2020-11-23 NOTE — Telephone Encounter (Signed)
Future visit in 1 month  

## 2020-11-26 DIAGNOSIS — J449 Chronic obstructive pulmonary disease, unspecified: Secondary | ICD-10-CM | POA: Diagnosis not present

## 2020-11-26 DIAGNOSIS — Z7902 Long term (current) use of antithrombotics/antiplatelets: Secondary | ICD-10-CM | POA: Diagnosis not present

## 2020-11-26 DIAGNOSIS — C4361 Malignant melanoma of right upper limb, including shoulder: Secondary | ICD-10-CM | POA: Diagnosis not present

## 2020-11-26 DIAGNOSIS — Z7982 Long term (current) use of aspirin: Secondary | ICD-10-CM | POA: Diagnosis not present

## 2020-11-26 DIAGNOSIS — J432 Centrilobular emphysema: Secondary | ICD-10-CM | POA: Diagnosis not present

## 2020-11-26 DIAGNOSIS — Z7951 Long term (current) use of inhaled steroids: Secondary | ICD-10-CM | POA: Diagnosis not present

## 2020-11-26 DIAGNOSIS — Z79899 Other long term (current) drug therapy: Secondary | ICD-10-CM | POA: Diagnosis not present

## 2020-11-26 DIAGNOSIS — C773 Secondary and unspecified malignant neoplasm of axilla and upper limb lymph nodes: Secondary | ICD-10-CM | POA: Diagnosis not present

## 2020-11-26 DIAGNOSIS — R911 Solitary pulmonary nodule: Secondary | ICD-10-CM | POA: Diagnosis not present

## 2020-12-03 ENCOUNTER — Other Ambulatory Visit: Payer: Self-pay | Admitting: Family Medicine

## 2020-12-03 DIAGNOSIS — Z1231 Encounter for screening mammogram for malignant neoplasm of breast: Secondary | ICD-10-CM

## 2020-12-04 ENCOUNTER — Telehealth: Payer: Self-pay | Admitting: Family Medicine

## 2020-12-04 DIAGNOSIS — R911 Solitary pulmonary nodule: Secondary | ICD-10-CM

## 2020-12-04 NOTE — Telephone Encounter (Signed)
Pt states she has been going to [facility] for Melanoma and they are also following her lung nodule as well.   Thanks,   Mickel Baas

## 2020-12-04 NOTE — Telephone Encounter (Signed)
Please advise patient it is time for follow up chest CT for lung nodule. She should get call within the next week to have it scheduled.

## 2020-12-05 NOTE — Telephone Encounter (Signed)
Per Care Everywhere CT was done at [facility] on 11-26-2020

## 2020-12-06 ENCOUNTER — Other Ambulatory Visit: Payer: Self-pay | Admitting: Family Medicine

## 2020-12-06 DIAGNOSIS — J432 Centrilobular emphysema: Secondary | ICD-10-CM

## 2020-12-06 MED ORDER — ALBUTEROL SULFATE HFA 108 (90 BASE) MCG/ACT IN AERS: INHALATION_SPRAY | RESPIRATORY_TRACT | 0 refills | Status: DC

## 2020-12-06 NOTE — Telephone Encounter (Signed)
Medication: albuterol (VENTOLIN HFA) 108 (90 Base) MCG/ACT inhaler Has the pt contacted their pharmacy? No Pt wants to know if she can get 2 inhalers (at one time), one to put in her pocketbook and one to keep at home?  She says Dr Caryn Section has done before  Preferred pharmacy: [place], [place]  Please be advised refills may take up to 3 business days.  We ask that you follow up with your pharmacy.

## 2020-12-16 ENCOUNTER — Ambulatory Visit: Payer: Medicare Other | Admitting: Dermatology

## 2020-12-25 ENCOUNTER — Ambulatory Visit
Admission: RE | Admit: 2020-12-25 | Discharge: 2020-12-25 | Disposition: A | Discharge status: Home / Self Care | Payer: Medicare Other | Source: Ambulatory Visit | Attending: Family Medicine | Admitting: Family Medicine

## 2020-12-25 ENCOUNTER — Other Ambulatory Visit: Payer: Self-pay

## 2020-12-25 DIAGNOSIS — Z1231 Encounter for screening mammogram for malignant neoplasm of breast: Secondary | ICD-10-CM | POA: Insufficient documentation

## 2021-01-04 ENCOUNTER — Other Ambulatory Visit: Payer: Self-pay | Admitting: Family Medicine

## 2021-01-04 DIAGNOSIS — J432 Centrilobular emphysema: Secondary | ICD-10-CM

## 2021-01-04 NOTE — Telephone Encounter (Signed)
Requested Prescriptions  Pending Prescriptions Disp Refills  . albuterol (VENTOLIN HFA) 108 (90 Base) MCG/ACT inhaler [Pharmacy Med Name: Albuterol Sulfate HFA 108 (90 Base) MCG/ACT Inhalation Aerosol Solution] 18 g 0    Sig: INHALE 2 PUFFS BY MOUTH EVERY 6 HOURS AS NEEDED FOR WHEEZING AND FOR SHORTNESS OF BREATH     Pulmonology:  Beta Agonists Failed - 01/04/2021  3:10 PM      Failed - One inhaler should last at least one month. If the patient is requesting refills earlier, contact the patient to check for uncontrolled symptoms.      Passed - Valid encounter within last 12 months    Recent Outpatient Visits          5 months ago Type 2 diabetes mellitus with diabetic neuropathy, without long-term current use of insulin [facility])   [facility] Birdie Sons, MD   7 months ago Cough   [facility] Birdie Sons, MD   11 months ago Type 2 diabetes mellitus with diabetic neuropathy, without long-term current use of insulin [facility])   [facility] Birdie Sons, MD   1 year ago Type 2 diabetes mellitus with diabetic neuropathy, without long-term current use of insulin [facility])   [facility] Birdie Sons, MD   1 year ago Type 2 diabetes mellitus with diabetic neuropathy, without long-term current use of insulin [facility])   [facility] Birdie Sons, MD      Future Appointments            In 2 weeks Fisher, Kirstie Peri, MD [facility]

## 2021-01-09 ENCOUNTER — Other Ambulatory Visit: Payer: Self-pay

## 2021-01-09 ENCOUNTER — Encounter (INDEPENDENT_AMBULATORY_CARE_PROVIDER_SITE_OTHER): Payer: Medicare Other | Admitting: Ophthalmology

## 2021-01-09 DIAGNOSIS — H35033 Hypertensive retinopathy, bilateral: Secondary | ICD-10-CM | POA: Diagnosis not present

## 2021-01-09 DIAGNOSIS — H35073 Retinal telangiectasis, bilateral: Secondary | ICD-10-CM

## 2021-01-09 DIAGNOSIS — H43823 Vitreomacular adhesion, bilateral: Secondary | ICD-10-CM

## 2021-01-09 DIAGNOSIS — I10 Essential (primary) hypertension: Secondary | ICD-10-CM

## 2021-01-09 DIAGNOSIS — H43813 Vitreous degeneration, bilateral: Secondary | ICD-10-CM

## 2021-01-21 ENCOUNTER — Ambulatory Visit: Payer: Self-pay | Admitting: Family Medicine

## 2021-01-31 ENCOUNTER — Encounter: Payer: Self-pay | Admitting: Family Medicine

## 2021-01-31 ENCOUNTER — Ambulatory Visit (INDEPENDENT_AMBULATORY_CARE_PROVIDER_SITE_OTHER): Payer: Medicare Other | Admitting: Family Medicine

## 2021-01-31 ENCOUNTER — Other Ambulatory Visit: Payer: Self-pay

## 2021-01-31 VITALS — BP 149/71 | HR 86 | Temp 97.8°F | Ht 65.0 in | Wt 161.1 lb

## 2021-01-31 DIAGNOSIS — R5382 Chronic fatigue, unspecified: Secondary | ICD-10-CM | POA: Diagnosis not present

## 2021-01-31 DIAGNOSIS — J301 Allergic rhinitis due to pollen: Secondary | ICD-10-CM | POA: Diagnosis not present

## 2021-01-31 DIAGNOSIS — C4361 Malignant melanoma of right upper limb, including shoulder: Secondary | ICD-10-CM

## 2021-01-31 DIAGNOSIS — Z716 Tobacco abuse counseling: Secondary | ICD-10-CM | POA: Insufficient documentation

## 2021-01-31 DIAGNOSIS — I10 Essential (primary) hypertension: Secondary | ICD-10-CM | POA: Diagnosis not present

## 2021-01-31 DIAGNOSIS — R7989 Other specified abnormal findings of blood chemistry: Secondary | ICD-10-CM | POA: Diagnosis not present

## 2021-01-31 DIAGNOSIS — R5383 Other fatigue: Secondary | ICD-10-CM | POA: Diagnosis not present

## 2021-01-31 DIAGNOSIS — E559 Vitamin D deficiency, unspecified: Secondary | ICD-10-CM

## 2021-01-31 DIAGNOSIS — R059 Cough, unspecified: Secondary | ICD-10-CM

## 2021-01-31 DIAGNOSIS — R002 Palpitations: Secondary | ICD-10-CM | POA: Diagnosis not present

## 2021-01-31 DIAGNOSIS — R790 Abnormal level of blood mineral: Secondary | ICD-10-CM | POA: Diagnosis not present

## 2021-01-31 DIAGNOSIS — Z720 Tobacco use: Secondary | ICD-10-CM

## 2021-01-31 DIAGNOSIS — F5101 Primary insomnia: Secondary | ICD-10-CM

## 2021-01-31 NOTE — Assessment & Plan Note (Signed)
Known concern; repeat today given fatigue

## 2021-01-31 NOTE — Assessment & Plan Note (Signed)
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

## 2021-01-31 NOTE — Assessment & Plan Note (Signed)
Ongoing conversation regarding how it is not too late to stop smoking Known lung nodule Known skin cancer hx  Reviewed current risk for heart attack/stroke The 10-year ASCVD risk score (Arnett DK, et al., 2019) is: 70.5%   Values used to calculate the score:     Age: 80 years     Sex: Female     Is Non-Hispanic African American: No     Diabetic: Yes     Tobacco smoker: Yes     Systolic Blood Pressure: 876 mmHg     Is BP treated: Yes     HDL Cholesterol: 56 mg/dL     Total Cholesterol: 140 mg/dL

## 2021-01-31 NOTE — Assessment & Plan Note (Signed)
1 month hx of fatigue; denies difficulty sleeping Denies sick contacts

## 2021-01-31 NOTE — Assessment & Plan Note (Signed)
Continues to smoke 1/2 ppd or more; does not wish to quit

## 2021-01-31 NOTE — Assessment & Plan Note (Signed)
SBP elevated today; continue to monitor Denies CP, SOB Endorses some palpitations during day when laying down; not present at night

## 2021-01-31 NOTE — Progress Notes (Signed)
Established patient visit   Patient: Erica Rivas   DOB: 12/21/1940   80 y.o. Female  MRN: [id number] Visit Date: 01/31/2021  Today's healthcare provider: Gwyneth Sprout, FNP   Recent fatigue  Subjective    HPI  Patient reports feeling fatigue for about the past 3-4 weeks. Patient reports having some headaches. Spot on lungs being monitored at [facility]. Follows up with that appointment next month  Medications: Outpatient Medications Prior to Visit  Medication Sig   albuterol (VENTOLIN HFA) 108 (90 Base) MCG/ACT inhaler INHALE 2 PUFFS BY MOUTH EVERY 6 HOURS AS NEEDED FOR WHEEZING AND FOR SHORTNESS OF BREATH   amitriptyline (ELAVIL) 25 MG tablet TAKE 1 TABLET BY MOUTH AT  BEDTIME   amLODipine (NORVASC) 5 MG tablet TAKE 1 TABLET BY MOUTH  DAILY   aspirin EC 81 MG tablet Take 81 mg by mouth daily.   atenolol (TENORMIN) 50 MG tablet TAKE 1 TABLET BY MOUTH  TWICE DAILY   atorvastatin (LIPITOR) 80 MG tablet TAKE 1 TABLET BY MOUTH  DAILY   Cholecalciferol (VITAMIN D) 2000 UNITS tablet Take 1,000 Units by mouth daily.    clopidogrel (PLAVIX) 75 MG tablet Take 1 tablet (75 mg total) by mouth daily.   enalapril (VASOTEC) 2.5 MG tablet Take by mouth.   furosemide (LASIX) 20 MG tablet Take 1 tablet (20 mg total) by mouth daily as needed (swelling).   glipiZIDE (GLUCOTROL XL) 10 MG 24 hr tablet TAKE 1 TABLET BY MOUTH  DAILY   lisinopril-hydrochlorothiazide (ZESTORETIC) 20-25 MG tablet TAKE 1 TABLET BY MOUTH  DAILY   metFORMIN (GLUCOPHAGE) 1000 MG tablet TAKE 1 TABLET BY MOUTH  TWICE DAILY   montelukast (SINGULAIR) 10 MG tablet TAKE 1 TABLET BY MOUTH  DAILY   mupirocin ointment (BACTROBAN) 2 % Apply 1 application topically daily. Qd to excision site on right arm until healed   omeprazole (PRILOSEC) 40 MG capsule TAKE 1 CAPSULE BY MOUTH  DAILY   OneTouch Delica Lancets 03T MISC USE TO CHECK BLOOD SUGAR DAILY   ONETOUCH ULTRA test strip USE TO TEST BLOOD SUGAR DAILY.   pravastatin (PRAVACHOL)  40 MG tablet Take by mouth.   SYMBICORT 160-4.5 MCG/ACT inhaler Inhale 2 puffs into the lungs 2 (two) times daily.   valACYclovir (VALTREX) 1000 MG tablet 2 tablets twice a day for 1 day as needed for cold sores   [DISCONTINUED] citalopram (CELEXA) 20 MG tablet Take by mouth.   [DISCONTINUED] escitalopram (LEXAPRO) 10 MG tablet TAKE 1 TABLET BY MOUTH  DAILY   No facility-administered medications prior to visit.    Review of Systems     Objective    BP (!) 149/71   Pulse 86   Temp 97.8 F (36.6 C) (Oral)   Ht 5\' 5"  (1.651 m)   Wt 161 lb 1.6 oz (73.1 kg)   SpO2 98%   BMI 26.81 kg/m  {Show previous vital signs (optional):23777}  Physical Exam Vitals and nursing note reviewed.  Constitutional:      General: She is not in acute distress.    Appearance: Normal appearance. She is overweight. She is not ill-appearing, toxic-appearing or diaphoretic.  HENT:     Head: Normocephalic and atraumatic.  Cardiovascular:     Rate and Rhythm: Normal rate and regular rhythm.     Pulses: Normal pulses.     Heart sounds: Normal heart sounds. No murmur heard.   No friction rub. No gallop.     Comments: SR confirmed  on EKG; some reports of palpitations when laying down for a nap during day however, no complaints at night Pulmonary:     Effort: Pulmonary effort is normal. No respiratory distress.     Breath sounds: Normal breath sounds. No stridor. No wheezing, rhonchi or rales.  Chest:     Chest wall: No tenderness.  Abdominal:     General: Bowel sounds are normal.     Palpations: Abdomen is soft.  Musculoskeletal:        General: No swelling, tenderness, deformity or signs of injury. Normal range of motion.     Right lower leg: No edema.     Left lower leg: No edema.  Skin:    General: Skin is warm.     Capillary Refill: Capillary refill takes less than 2 seconds.     Coloration: Skin is pale. Skin is not jaundiced.     Findings: Lesion and rash present. No bruising or erythema.      Comments: Various skin cancer lesions on Bilateral upper extremities; two patches on R hand/wrist that have grown in size that patient needs to have 'frozen off'  Neurological:     General: No focal deficit present.     Mental Status: She is alert and oriented to person, place, and time. Mental status is at baseline.     Cranial Nerves: No cranial nerve deficit.     Sensory: No sensory deficit.     Motor: No weakness.     Coordination: Coordination normal.  Psychiatric:        Mood and Affect: Mood normal.        Behavior: Behavior normal.        Thought Content: Thought content normal.        Judgment: Judgment normal.     No results found for any visits on 01/31/21.  Assessment & Plan     Problem List Items Addressed This Visit       Cardiovascular and Mediastinum   Essential hypertension    SBP elevated today; continue to monitor Denies CP, SOB Endorses some palpitations during day when laying down; not present at night        Respiratory   Allergic rhinitis    Acute on chronic Some recent headaches Does not wish to take any additional medications at this time        Other   Insomnia    No longer a concern; pt endorses the opposite problem if any Has been tired during the day, even upon first waking Has been taking multiple naps lately      Vitamin D deficiency    Known concern; repeat today given fatigue      Relevant Orders   VITAMIN D 25 Hydroxy (Vit-D Deficiency, Fractures)   Malignant melanoma of right forearm (HCC)    Known concern; follows derm routinely Unsure if lung or skin is correlated      Fatigue    1 month hx of fatigue; denies difficulty sleeping Denies sick contacts      Relevant Orders   CBC with Differential/Platelet   Comprehensive metabolic panel   T4, free   TSH   Vitamin B12   VITAMIN D 25 Hydroxy (Vit-D Deficiency, Fractures)   Iron, TIBC and Ferritin Panel   B Nat Peptide   Tobacco abuse - Primary    Continues to smoke  1/2 ppd or more; does not wish to quit      Tobacco abuse counseling    Ongoing  conversation regarding how it is not too late to stop smoking Known lung nodule Known skin cancer hx  Reviewed current risk for heart attack/stroke The 10-year ASCVD risk score (Arnett DK, et al., 2019) is: 70.5%   Values used to calculate the score:     Age: 89 years     Sex: Female     Is Non-Hispanic African American: No     Diabetic: Yes     Tobacco smoker: Yes     Systolic Blood Pressure: 992 mmHg     Is BP treated: Yes     HDL Cholesterol: 56 mg/dL     Total Cholesterol: 140 mg/dL       Cough    Chronic concern, r/t COPD      Relevant Orders   B Nat Peptide   Palpitation    Noted when laying down for nap during day; not present at bedtime EKG normal, 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      Vonna Kotyk, FNP, have reviewed all documentation for this visit. The documentation on 01/31/21 for the exam, diagnosis, procedures, and orders are all accurate and complete.  Gwyneth Sprout, [place] [phone] (phone) [phone] (fax)  [place]

## 2021-01-31 NOTE — Assessment & Plan Note (Signed)
Chronic concern, r/t COPD

## 2021-01-31 NOTE — Assessment & Plan Note (Addendum)
Acute on chronic Some recent headaches Does not wish to take any additional medications at this time

## 2021-01-31 NOTE — Assessment & Plan Note (Signed)
No longer a concern; pt endorses the opposite problem if any Has been tired during the day, even upon first waking Has been taking multiple naps lately

## 2021-01-31 NOTE — Assessment & Plan Note (Signed)
Known concern; follows derm routinely Unsure if lung or skin is correlated

## 2021-02-01 LAB — CBC WITH DIFFERENTIAL/PLATELET
Basophils Absolute: 0.1 10*3/uL (ref 0.0–0.2)
Basos: 0 %
EOS (ABSOLUTE): 0.1 10*3/uL (ref 0.0–0.4)
Eos: 1 %
Hematocrit: 31.4 % — ABNORMAL LOW (ref 34.0–46.6)
Hemoglobin: 9.9 g/dL — ABNORMAL LOW (ref 11.1–15.9)
Immature Grans (Abs): 0.1 10*3/uL (ref 0.0–0.1)
Immature Granulocytes: 1 %
Lymphocytes Absolute: 3.5 10*3/uL — ABNORMAL HIGH (ref 0.7–3.1)
Lymphs: 28 %
MCH: 25.5 pg — ABNORMAL LOW (ref 26.6–33.0)
MCHC: 31.5 g/dL (ref 31.5–35.7)
MCV: 81 fL (ref 79–97)
Monocytes Absolute: 0.7 10*3/uL (ref 0.1–0.9)
Monocytes: 6 %
Neutrophils Absolute: 8 10*3/uL — ABNORMAL HIGH (ref 1.4–7.0)
Neutrophils: 64 %
Platelets: 291 10*3/uL (ref 150–450)
RBC: 3.88 x10E6/uL (ref 3.77–5.28)
RDW: 15.3 % (ref 11.7–15.4)
WBC: 12.5 10*3/uL — ABNORMAL HIGH (ref 3.4–10.8)

## 2021-02-01 LAB — COMPREHENSIVE METABOLIC PANEL
ALT: 15 IU/L (ref 0–32)
AST: 18 IU/L (ref 0–40)
Albumin/Globulin Ratio: 2 (ref 1.2–2.2)
Albumin: 4.6 g/dL (ref 3.7–4.7)
Alkaline Phosphatase: 92 IU/L (ref 44–121)
BUN/Creatinine Ratio: 14 (ref 12–28)
BUN: 17 mg/dL (ref 8–27)
Bilirubin Total: <0.2 mg/dL (ref 0.0–1.2)
CO2: 25 mmol/L (ref 20–29)
Calcium: 10.2 mg/dL (ref 8.7–10.3)
Chloride: 95 mmol/L — ABNORMAL LOW (ref 96–106)
Creatinine, Ser: 1.21 mg/dL — ABNORMAL HIGH (ref 0.57–1.00)
Globulin, Total: 2.3 g/dL (ref 1.5–4.5)
Glucose: 163 mg/dL — ABNORMAL HIGH (ref 65–99)
Potassium: 4.6 mmol/L (ref 3.5–5.2)
Sodium: 138 mmol/L (ref 134–144)
Total Protein: 6.9 g/dL (ref 6.0–8.5)
eGFR: 46 mL/min/{1.73_m2} — ABNORMAL LOW (ref >59)

## 2021-02-01 LAB — IRON,TIBC AND FERRITIN PANEL
Ferritin: 15 ng/mL (ref 15–150)
Iron Saturation: 11 % — ABNORMAL LOW (ref 15–55)
Iron: 46 ug/dL (ref 27–139)
Total Iron Binding Capacity: 427 ug/dL (ref 250–450)
UIBC: 381 ug/dL — ABNORMAL HIGH (ref 118–369)

## 2021-02-01 LAB — VITAMIN D 25 HYDROXY (VIT D DEFICIENCY, FRACTURES): Vit D, 25-Hydroxy: 43.9 ng/mL (ref 30.0–100.0)

## 2021-02-01 LAB — T4, FREE: Free T4: 1.39 ng/dL (ref 0.82–1.77)

## 2021-02-01 LAB — VITAMIN B12: Vitamin B-12: 297 pg/mL (ref 232–1245)

## 2021-02-01 LAB — BRAIN NATRIURETIC PEPTIDE: BNP: 44.3 pg/mL (ref 0.0–100.0)

## 2021-02-01 LAB — TSH: TSH: 1.52 u[IU]/mL (ref 0.450–4.500)

## 2021-02-06 ENCOUNTER — Ambulatory Visit: Payer: Self-pay | Admitting: Family Medicine

## 2021-02-20 DIAGNOSIS — E119 Type 2 diabetes mellitus without complications: Secondary | ICD-10-CM | POA: Diagnosis not present

## 2021-02-20 DIAGNOSIS — Z961 Presence of intraocular lens: Secondary | ICD-10-CM | POA: Diagnosis not present

## 2021-02-20 DIAGNOSIS — H353132 Nonexudative age-related macular degeneration, bilateral, intermediate dry stage: Secondary | ICD-10-CM | POA: Diagnosis not present

## 2021-02-20 DIAGNOSIS — H43823 Vitreomacular adhesion, bilateral: Secondary | ICD-10-CM | POA: Diagnosis not present

## 2021-02-20 DIAGNOSIS — H35353 Cystoid macular degeneration, bilateral: Secondary | ICD-10-CM | POA: Diagnosis not present

## 2021-02-25 DIAGNOSIS — J432 Centrilobular emphysema: Secondary | ICD-10-CM | POA: Diagnosis not present

## 2021-02-25 DIAGNOSIS — D099 Carcinoma in situ, unspecified: Secondary | ICD-10-CM | POA: Diagnosis not present

## 2021-02-25 DIAGNOSIS — R911 Solitary pulmonary nodule: Secondary | ICD-10-CM | POA: Diagnosis not present

## 2021-02-25 DIAGNOSIS — R918 Other nonspecific abnormal finding of lung field: Secondary | ICD-10-CM | POA: Diagnosis not present

## 2021-02-25 DIAGNOSIS — I2510 Atherosclerotic heart disease of native coronary artery without angina pectoris: Secondary | ICD-10-CM | POA: Diagnosis not present

## 2021-02-27 DIAGNOSIS — Z7982 Long term (current) use of aspirin: Secondary | ICD-10-CM | POA: Diagnosis not present

## 2021-02-27 DIAGNOSIS — D508 Other iron deficiency anemias: Secondary | ICD-10-CM | POA: Diagnosis not present

## 2021-02-27 DIAGNOSIS — C4361 Malignant melanoma of right upper limb, including shoulder: Secondary | ICD-10-CM | POA: Diagnosis not present

## 2021-02-27 DIAGNOSIS — R918 Other nonspecific abnormal finding of lung field: Secondary | ICD-10-CM | POA: Diagnosis not present

## 2021-02-27 DIAGNOSIS — Z792 Long term (current) use of antibiotics: Secondary | ICD-10-CM | POA: Diagnosis not present

## 2021-02-27 DIAGNOSIS — Z7902 Long term (current) use of antithrombotics/antiplatelets: Secondary | ICD-10-CM | POA: Diagnosis not present

## 2021-03-01 ENCOUNTER — Other Ambulatory Visit: Payer: Self-pay | Admitting: Family Medicine

## 2021-03-01 DIAGNOSIS — J301 Allergic rhinitis due to pollen: Secondary | ICD-10-CM

## 2021-03-01 DIAGNOSIS — F419 Anxiety disorder, unspecified: Secondary | ICD-10-CM

## 2021-03-01 DIAGNOSIS — I10 Essential (primary) hypertension: Secondary | ICD-10-CM

## 2021-03-01 NOTE — Telephone Encounter (Signed)
Requested medications are due for refill today NO  Requested medications are on the active medication list NO  Last refill 12/29/20  Last visit 01/31/21  Future visit scheduled 03/04/21  Notes to clinic Not on  current med list.  Requested Prescriptions  Pending Prescriptions Disp Refills   escitalopram (LEXAPRO) 10 MG tablet [Pharmacy Med Name: Escitalopram Oxalate 10 MG Oral Tablet] 90 tablet 3    Sig: TAKE 1 TABLET BY MOUTH  DAILY     Psychiatry:  Antidepressants - SSRI Passed - 03/01/2021  5:39 AM      Passed - Completed PHQ-2 or PHQ-9 in the last 360 days      Passed - Valid encounter within last 6 months    Recent Outpatient Visits           4 weeks ago Tobacco abuse   [facility] Tally Joe T, FNP   6 months ago Type 2 diabetes mellitus with diabetic neuropathy, without long-term current use of insulin [facility])   [facility] Birdie Sons, MD   9 months ago Cough   [facility] Birdie Sons, MD   1 year ago Type 2 diabetes mellitus with diabetic neuropathy, without long-term current use of insulin ([place])   [facility] Birdie Sons, MD   1 year ago Type 2 diabetes mellitus with diabetic neuropathy, without long-term current use of insulin [facility])   [place], Kirstie Peri, MD       Future Appointments             In 3 days Fisher, Kirstie Peri, MD [facility]            Signed Prescriptions Disp Refills   montelukast (SINGULAIR) 10 MG tablet 90 tablet 3    Sig: TAKE 1 TABLET BY MOUTH  DAILY     Pulmonology:  Leukotriene Inhibitors Passed - 03/01/2021  5:39 AM      Passed - Valid encounter within last 12 months    Recent Outpatient Visits           4 weeks ago Tobacco abuse   [facility] Tally Joe T, FNP   6 months ago Type 2 diabetes mellitus with diabetic neuropathy, without long-term current use of insulin [facility])   [facility] Birdie Sons, MD   9 months ago Cough   [facility] Birdie Sons, MD   1 year ago Type 2 diabetes mellitus with diabetic neuropathy, without long-term current use of insulin ([place])   [facility] Birdie Sons, MD   1 year ago Type 2 diabetes mellitus with diabetic neuropathy, without long-term current use of insulin [facility])   [facility] Birdie Sons, MD       Future Appointments             In 3 days Fisher, Kirstie Peri, MD [facility]             lisinopril-hydrochlorothiazide (ZESTORETIC) 20-25 MG tablet 90 tablet 1    Sig: TAKE 1 TABLET BY MOUTH  DAILY     Cardiovascular:  ACEI + Diuretic Combos Failed - 03/01/2021  5:39 AM      Failed - Cr in normal range and within 180 days    Creatinine, Ser  Date Value Ref Range Status  01/31/2021 1.21 (H) 0.57 - 1.00 mg/dL Final   Creatinine, POC  Date Value Ref Range Status  07/13/2016 n/a mg/dL Final          Failed - Last BP in normal range    BP Readings from Last 1 Encounters:  01/31/21 (!) 149/71          Passed - Na in normal range and within 180 days    Sodium  Date Value Ref Range Status  01/31/2021 138 134 - 144 mmol/L Final          Passed - K in normal range and within 180 days    Potassium  Date Value Ref Range Status  01/31/2021 4.6 3.5 - 5.2 mmol/L Final          Passed - Ca in normal range and within 180 days    Calcium  Date Value Ref Range Status  01/31/2021 10.2 8.7 - 10.3 mg/dL Final          Passed - Patient is not pregnant      Passed - Valid encounter within last 6 months    Recent Outpatient Visits           4 weeks ago Tobacco abuse   [facility] Tally Joe T, FNP   6 months ago Type 2 diabetes mellitus with diabetic neuropathy, without long-term current use of insulin [facility])   [facility] Birdie Sons, MD   9 months ago Cough   [facility]  Birdie Sons, MD   1 year ago Type 2 diabetes mellitus with diabetic neuropathy, without long-term current use of insulin ([place])   [facility] Birdie Sons, MD   1 year ago Type 2 diabetes mellitus with diabetic neuropathy, without long-term current use of insulin [facility])   [facility] Birdie Sons, MD       Future Appointments             In 3 days Fisher, Kirstie Peri, MD [facility]             atenolol (TENORMIN) 50 MG tablet 180 tablet 1    Sig: TAKE 1 TABLET BY MOUTH  TWICE DAILY     Cardiovascular:  Beta Blockers Failed - 03/01/2021  5:39 AM      Failed - Last BP in normal range    BP Readings from Last 1 Encounters:  01/31/21 (!) 149/71          Passed - Last Heart Rate in normal range    Pulse Readings from Last 1 Encounters:  01/31/21 86          Passed - Valid encounter within last 6 months    Recent Outpatient Visits           4 weeks ago Tobacco abuse   [facility] Tally Joe T, FNP   6 months ago Type 2 diabetes mellitus with diabetic neuropathy, without long-term current use of insulin [facility])   [facility] Birdie Sons, MD   9 months ago Cough   [facility] Birdie Sons, MD   1 year ago Type 2 diabetes mellitus with diabetic neuropathy, without long-term current use of insulin [facility])   [facility] Birdie Sons, MD   1 year ago Type 2 diabetes mellitus with diabetic neuropathy, without long-term current use of insulin [facility])   [facility] Birdie Sons, MD       Future Appointments             In  3 days Birdie Sons, MD [facility]             amitriptyline (ELAVIL) 25 MG tablet 90 tablet 1    Sig: TAKE 1 TABLET BY MOUTH AT  BEDTIME     Psychiatry:  Antidepressants - Heterocyclics (TCAs) Passed - 03/01/2021  5:39 AM      Passed - Completed PHQ-2 or PHQ-9 in the last 360  days      Passed - Valid encounter within last 6 months    Recent Outpatient Visits           4 weeks ago Tobacco abuse   [facility] Tally Joe T, FNP   6 months ago Type 2 diabetes mellitus with diabetic neuropathy, without long-term current use of insulin [facility])   [facility] Birdie Sons, MD   9 months ago Cough   [facility] Birdie Sons, MD   1 year ago Type 2 diabetes mellitus with diabetic neuropathy, without long-term current use of insulin ([place])   [facility] Birdie Sons, MD   1 year ago Type 2 diabetes mellitus with diabetic neuropathy, without long-term current use of insulin [facility]   [facility] Birdie Sons, MD       Future Appointments             In 3 days Fisher, Kirstie Peri, MD [facility]             amLODipine (NORVASC) 5 MG tablet 90 tablet 1    Sig: TAKE 1 TABLET BY MOUTH  DAILY     Cardiovascular:  Calcium Channel Blockers Failed - 03/01/2021  5:39 AM      Failed - Last BP in normal range    BP Readings from Last 1 Encounters:  01/31/21 (!) 149/71          Passed - Valid encounter within last 6 months    Recent Outpatient Visits           4 weeks ago Tobacco abuse   [facility] Tally Joe T, FNP   6 months ago Type 2 diabetes mellitus with diabetic neuropathy, without long-term current use of insulin [facility])   [facility] Birdie Sons, MD   9 months ago Cough   [facility] Birdie Sons, MD   1 year ago Type 2 diabetes mellitus with diabetic neuropathy, without long-term current use of insulin [facility])   [facility] Birdie Sons, MD   1 year ago Type 2 diabetes mellitus with diabetic neuropathy, without long-term current use of insulin [facility])   [facility] Birdie Sons, MD       Future Appointments             In 3 days Fisher, Kirstie Peri, MD  [facility], [facility]

## 2021-03-01 NOTE — Telephone Encounter (Signed)
Requested Prescriptions  Pending Prescriptions Disp Refills  . montelukast (SINGULAIR) 10 MG tablet [Pharmacy Med Name: Montelukast Sodium 10 MG Oral Tablet] 90 tablet 3    Sig: TAKE 1 TABLET BY MOUTH  DAILY     Pulmonology:  Leukotriene Inhibitors Passed - 03/01/2021  5:39 AM      Passed - Valid encounter within last 12 months    Recent Outpatient Visits          4 weeks ago Tobacco abuse   [facility] Tally Joe T, FNP   6 months ago Type 2 diabetes mellitus with diabetic neuropathy, without long-term current use of insulin [facility])   [facility] Birdie Sons, MD   9 months ago Cough   [facility] Birdie Sons, MD   1 year ago Type 2 diabetes mellitus with diabetic neuropathy, without long-term current use of insulin ([place])   [facility] Birdie Sons, MD   1 year ago Type 2 diabetes mellitus with diabetic neuropathy, without long-term current use of insulin ([place])   [place], Kirstie Peri, MD      Future Appointments            In 3 days Fisher, Kirstie Peri, MD [facility], [facility]           . escitalopram (LEXAPRO) 10 MG tablet [Pharmacy Med Name: Escitalopram Oxalate 10 MG Oral Tablet] 90 tablet 3    Sig: TAKE 1 TABLET BY MOUTH  DAILY     Psychiatry:  Antidepressants - SSRI Passed - 03/01/2021  5:39 AM      Passed - Completed PHQ-2 or PHQ-9 in the last 360 days      Passed - Valid encounter within last 6 months    Recent Outpatient Visits          4 weeks ago Tobacco abuse   [facility] Tally Joe T, FNP   6 months ago Type 2 diabetes mellitus with diabetic neuropathy, without long-term current use of insulin [facility])   [facility] Birdie Sons, MD   9 months ago Cough   [facility] Birdie Sons, MD   1 year ago Type 2 diabetes mellitus with diabetic neuropathy, without long-term current use of insulin [facility])    [facility] Birdie Sons, MD   1 year ago Type 2 diabetes mellitus with diabetic neuropathy, without long-term current use of insulin [facility])   [facility] Birdie Sons, MD      Future Appointments            In 3 days Fisher, Kirstie Peri, MD [facility], [facility]           . lisinopril-hydrochlorothiazide (ZESTORETIC) 20-25 MG tablet [Pharmacy Med Name: Lisinopril-hydroCHLOROthiazide 20-25 MG Oral Tablet] 90 tablet 1    Sig: TAKE 1 TABLET BY MOUTH  DAILY     Cardiovascular:  ACEI + Diuretic Combos Failed - 03/01/2021  5:39 AM      Failed - Cr in normal range and within 180 days    Creatinine, Ser  Date Value Ref Range Status  01/31/2021 1.21 (H) 0.57 - 1.00 mg/dL Final   Creatinine, POC  Date Value Ref Range Status  07/13/2016 n/a mg/dL Final         Failed - Last BP in normal range    BP Readings from Last 1 Encounters:  01/31/21 (!) 149/71  Passed - Na in normal range and within 180 days    Sodium  Date Value Ref Range Status  01/31/2021 138 134 - 144 mmol/L Final         Passed - K in normal range and within 180 days    Potassium  Date Value Ref Range Status  01/31/2021 4.6 3.5 - 5.2 mmol/L Final         Passed - Ca in normal range and within 180 days    Calcium  Date Value Ref Range Status  01/31/2021 10.2 8.7 - 10.3 mg/dL Final         Passed - Patient is not pregnant      Passed - Valid encounter within last 6 months    Recent Outpatient Visits          4 weeks ago Tobacco abuse   [facility] Tally Joe T, FNP   6 months ago Type 2 diabetes mellitus with diabetic neuropathy, without long-term current use of insulin [facility])   [facility] Birdie Sons, MD   9 months ago Cough   [facility] Birdie Sons, MD   1 year ago Type 2 diabetes mellitus with diabetic neuropathy, without long-term current use of insulin ([place])   [facility]  Birdie Sons, MD   1 year ago Type 2 diabetes mellitus with diabetic neuropathy, without long-term current use of insulin ([place])   [place], Kirstie Peri, MD      Future Appointments            In 3 days Fisher, Kirstie Peri, MD [facility], [facility]           . atenolol (TENORMIN) 50 MG tablet [Pharmacy Med Name: Atenolol 50 MG Oral Tablet] 180 tablet 1    Sig: TAKE 1 TABLET BY MOUTH  TWICE DAILY     Cardiovascular:  Beta Blockers Failed - 03/01/2021  5:39 AM      Failed - Last BP in normal range    BP Readings from Last 1 Encounters:  01/31/21 (!) 149/71         Passed - Last Heart Rate in normal range    Pulse Readings from Last 1 Encounters:  01/31/21 86         Passed - Valid encounter within last 6 months    Recent Outpatient Visits          4 weeks ago Tobacco abuse   [facility] Tally Joe T, FNP   6 months ago Type 2 diabetes mellitus with diabetic neuropathy, without long-term current use of insulin [facility])   [facility] Birdie Sons, MD   9 months ago Cough   [facility] Birdie Sons, MD   1 year ago Type 2 diabetes mellitus with diabetic neuropathy, without long-term current use of insulin [facility])   [facility] Birdie Sons, MD   1 year ago Type 2 diabetes mellitus with diabetic neuropathy, without long-term current use of insulin [facility])   [place], Kirstie Peri, MD      Future Appointments            In 3 days Fisher, Kirstie Peri, MD [facility], [facility]           . amitriptyline (ELAVIL) 25 MG tablet [Pharmacy Med Name: Amitriptyline HCl 25 MG Oral Tablet] 90 tablet 1    Sig: TAKE 1 TABLET BY  MOUTH AT  BEDTIME     Psychiatry:  Antidepressants - Heterocyclics (TCAs) Passed - 03/01/2021  5:39 AM      Passed - Completed PHQ-2 or PHQ-9 in the last 360 days      Passed - Valid encounter within last 6 months    Recent  Outpatient Visits          4 weeks ago Tobacco abuse   [facility] Tally Joe T, FNP   6 months ago Type 2 diabetes mellitus with diabetic neuropathy, without long-term current use of insulin [facility])   [facility] Birdie Sons, MD   9 months ago Cough   [facility] Birdie Sons, MD   1 year ago Type 2 diabetes mellitus with diabetic neuropathy, without long-term current use of insulin ([place])   [facility] Birdie Sons, MD   1 year ago Type 2 diabetes mellitus with diabetic neuropathy, without long-term current use of insulin ([place])   [facility] Birdie Sons, MD      Future Appointments            In 3 days Fisher, Kirstie Peri, MD [facility], [facility]           . amLODipine ([place]) 5 MG tablet [Pharmacy Med Name: amLODIPine Besylate 5 MG Oral Tablet] 90 tablet 1    Sig: TAKE 1 TABLET BY MOUTH  DAILY     Cardiovascular:  Calcium Channel Blockers Failed - 03/01/2021  5:39 AM      Failed - Last BP in normal range    BP Readings from Last 1 Encounters:  01/31/21 (!) 149/71         Passed - Valid encounter within last 6 months    Recent Outpatient Visits          4 weeks ago Tobacco abuse   [facility] Tally Joe T, FNP   6 months ago Type 2 diabetes mellitus with diabetic neuropathy, without long-term current use of insulin [facility])   [facility] Birdie Sons, MD   9 months ago Cough   [facility] Birdie Sons, MD   1 year ago Type 2 diabetes mellitus with diabetic neuropathy, without long-term current use of insulin [facility])   [facility] Birdie Sons, MD   1 year ago Type 2 diabetes mellitus with diabetic neuropathy, without long-term current use of insulin [facility])   [facility] Birdie Sons, MD      Future Appointments            In 3 days Fisher, Kirstie Peri, MD [facility],  [place]

## 2021-03-04 ENCOUNTER — Encounter: Payer: Self-pay | Admitting: Family Medicine

## 2021-03-04 ENCOUNTER — Ambulatory Visit (INDEPENDENT_AMBULATORY_CARE_PROVIDER_SITE_OTHER): Payer: Medicare Other | Admitting: Family Medicine

## 2021-03-04 ENCOUNTER — Other Ambulatory Visit: Payer: Self-pay

## 2021-03-04 VITALS — BP 142/59 | HR 69 | Wt 161.0 lb

## 2021-03-04 DIAGNOSIS — E1140 Type 2 diabetes mellitus with diabetic neuropathy, unspecified: Secondary | ICD-10-CM | POA: Diagnosis not present

## 2021-03-04 DIAGNOSIS — I10 Essential (primary) hypertension: Secondary | ICD-10-CM | POA: Diagnosis not present

## 2021-03-04 DIAGNOSIS — D509 Iron deficiency anemia, unspecified: Secondary | ICD-10-CM | POA: Diagnosis not present

## 2021-03-04 DIAGNOSIS — R5383 Other fatigue: Secondary | ICD-10-CM

## 2021-03-04 DIAGNOSIS — F331 Major depressive disorder, recurrent, moderate: Secondary | ICD-10-CM | POA: Diagnosis not present

## 2021-03-04 DIAGNOSIS — C779 Secondary and unspecified malignant neoplasm of lymph node, unspecified: Secondary | ICD-10-CM

## 2021-03-04 LAB — POCT GLYCOSYLATED HEMOGLOBIN (HGB A1C): Hemoglobin A1C: 7.1 % — AB (ref 4.0–5.6)

## 2021-03-04 MED ORDER — BUPROPION HCL ER (XL) 150 MG PO TB24: 150.0000 mg | ORAL_TABLET | Freq: Every day | ORAL | 2 refills | Status: DC

## 2021-03-04 MED ORDER — IRON (FERROUS SULFATE) 325 (65 FE) MG PO TABS: 325.0000 mg | ORAL_TABLET | Freq: Every day | ORAL | Status: DC

## 2021-03-04 NOTE — Progress Notes (Signed)
Established patient visit   Patient: Erica Rivas   DOB: 16-May-1941   80 y.o. Female  MRN: [id number] Visit Date: 03/04/2021  Today's healthcare provider: Lelon Huh, MD   No chief complaint on file.  Subjective    HPI  Diabetes Mellitus Type II, follow-up  Lab Results  Component Value Date   HGBA1C 7.0 (A) 08/06/2020   HGBA1C 6.8 (A) 01/26/2020   HGBA1C 7.0 (A) 09/25/2019   Last seen for diabetes 6 months ago.  Management since then includes continuing the same treatment. She reports excellent compliance with treatment. She is not having side effects.   Home blood sugar records: fasting range: 90's   Episodes of hypoglycemia? No    Current insulin regiment: None Most Recent Eye Exam: UTD  --------------------------------------------------------------------------------------------------- Hypertension, follow-up  BP Readings from Last 3 Encounters:  03/04/21 (!) 142/59  01/31/21 (!) 149/71  09/20/20 107/60   Wt Readings from Last 3 Encounters:  03/04/21 161 lb (73 kg)  01/31/21 161 lb 1.6 oz (73.1 kg)  09/20/20 159 lb (72.1 kg)     She was last seen for hypertension 1 months ago.  BP at that visit was 149/71. Management since that visit includes no changes. She reports excellent compliance with treatment. She is not having side effects.  She is exercising. She is adherent to low salt diet.   Outside blood pressures are .  She does smoke.  Use of agents associated with hypertension: none.   --------------------------------------------------------------------------------------------------- Lipid/Cholesterol, follow-up  Last Lipid Panel: Lab Results  Component Value Date   CHOL 140 08/08/2020   LDLCALC 67 08/08/2020   HDL 56 08/08/2020   TRIG 90 08/08/2020    She was last seen for this 6 months ago.  Management since that visit includes no changes.  She reports excellent compliance with treatment. She is not having side effects.    Symptoms: No appetite changes No foot ulcerations  No chest pain No chest pressure/discomfort  No dyspnea No orthopnea  No fatigue No lower extremity edema  No palpitations No paroxysmal nocturnal dyspnea  No nausea No numbness or tingling of extremity  No polydipsia No polyuria  No speech difficulty No syncope    Last metabolic panel Lab Results  Component Value Date   GLUCOSE 163 (H) 01/31/2021   NA 138 01/31/2021   K 4.6 01/31/2021   BUN 17 01/31/2021   CREATININE 1.21 (H) 01/31/2021   EGFR 46 (L) 01/31/2021   GFRNONAA 39 (L) 11/14/2019   CALCIUM 10.2 01/31/2021   AST 18 01/31/2021   ALT 15 01/31/2021   The 10-year ASCVD risk score (Arnett DK, et al., 2019) is: 66.9%  ---------------------------------------------------------------------------------------------------  Follow up depression She stopped Lexapro and citalopram because she felt more tired when she was taking them. However is still depressed and very fatigued. She is concerned about having malignant melanoma and is being considered for radiation treatments.  Depression screen [facility] 2/9 01/31/2021 08/06/2020 09/25/2019  Decreased Interest 3 2 0  Down, Depressed, Hopeless 0 2 3  PHQ - 2 Score 3 4 3   Altered sleeping 3 1 0  Tired, decreased energy 3 2 3   Change in appetite 0 0 1  Feeling bad or failure about yourself  0 0 0  Trouble concentrating 2 2 0  Moving slowly or fidgety/restless 0 0 0  Suicidal thoughts 0 0 0  PHQ-9 Score 11 9 7   Difficult doing work/chores Somewhat difficult Not difficult at all Not difficult at  all    She was noted at her last visit in September that she is iron deficient with anemia and recommended via MyChart to start iron supplements. She has not started iron supplements.   Last CBC Lab Results  Component Value Date   WBC 12.5 (H) 01/31/2021   HGB 9.9 (L) 01/31/2021   HCT 31.4 (L) 01/31/2021   MCV 81 01/31/2021   MCH 25.5 (L) 01/31/2021   RDW 15.3 01/31/2021   PLT 291  01/31/2021   Lab Results  Component Value Date   IRON 46 01/31/2021   TIBC 427 01/31/2021   FERRITIN 15 01/31/2021     Medications: Outpatient Medications Prior to Visit  Medication Sig   albuterol (VENTOLIN HFA) 108 (90 Base) MCG/ACT inhaler INHALE 2 PUFFS BY MOUTH EVERY 6 HOURS AS NEEDED FOR WHEEZING AND FOR SHORTNESS OF BREATH   amitriptyline (ELAVIL) 25 MG tablet TAKE 1 TABLET BY MOUTH AT  BEDTIME   amLODipine (NORVASC) 5 MG tablet TAKE 1 TABLET BY MOUTH  DAILY   aspirin EC 81 MG tablet Take 81 mg by mouth daily.   atenolol (TENORMIN) 50 MG tablet TAKE 1 TABLET BY MOUTH  TWICE DAILY   atorvastatin (LIPITOR) 80 MG tablet TAKE 1 TABLET BY MOUTH  DAILY   Cholecalciferol (VITAMIN D) 2000 UNITS tablet Take 1,000 Units by mouth daily.    clopidogrel (PLAVIX) 75 MG tablet Take 1 tablet (75 mg total) by mouth daily.   enalapril (VASOTEC) 2.5 MG tablet Take by mouth.   furosemide (LASIX) 20 MG tablet Take 1 tablet (20 mg total) by mouth daily as needed (swelling).   glipiZIDE (GLUCOTROL XL) 10 MG 24 hr tablet TAKE 1 TABLET BY MOUTH  DAILY   lisinopril-hydrochlorothiazide (ZESTORETIC) 20-25 MG tablet TAKE 1 TABLET BY MOUTH  DAILY   metFORMIN (GLUCOPHAGE) 1000 MG tablet TAKE 1 TABLET BY MOUTH  TWICE DAILY   montelukast (SINGULAIR) 10 MG tablet TAKE 1 TABLET BY MOUTH  DAILY   mupirocin ointment (BACTROBAN) 2 % Apply 1 application topically daily. Qd to excision site on right arm until healed   omeprazole (PRILOSEC) 40 MG capsule TAKE 1 CAPSULE BY MOUTH  DAILY   OneTouch Delica Lancets 98J MISC USE TO CHECK BLOOD SUGAR DAILY   ONETOUCH ULTRA test strip USE TO TEST BLOOD SUGAR DAILY.   pravastatin (PRAVACHOL) 40 MG tablet Take by mouth.   SYMBICORT 160-4.5 MCG/ACT inhaler Inhale 2 puffs into the lungs 2 (two) times daily.   valACYclovir (VALTREX) 1000 MG tablet 2 tablets twice a day for 1 day as needed for cold sores   No facility-administered medications prior to visit.    Review of  Systems  Constitutional: Negative.   Respiratory:  Positive for cough, shortness of breath and wheezing. Negative for apnea, choking, chest tightness and stridor.        Pt has a history of COPD.  She reports this is stable.   Cardiovascular: Negative.   Gastrointestinal: Negative.   Endocrine: Negative.   Skin:  Negative for wound.  Neurological:  Negative for dizziness, light-headedness and headaches.      Objective    BP (!) 142/59 (BP Location: Right Arm, Patient Position: Sitting, Cuff Size: Large)   Pulse 69   Wt 161 lb (73 kg)   SpO2 100%   BMI 26.79 kg/m    Physical Exam   General: Appearance:     Overweight female in no acute distress  Eyes:    PERRL, conjunctiva/corneas clear, EOM's intact  Lungs:     Clear to auscultation bilaterally, respirations unlabored  Heart:    Normal heart rate. Normal rhythm. No murmurs, rubs, or gallops.    MS:   All extremities are intact.    Neurologic:   Awake, alert, oriented x 3. No apparent focal neurological defect.         Results for orders placed or performed in visit on 03/04/21  POCT glycosylated hemoglobin (Hb A1C)  Result Value Ref Range   Hemoglobin A1C 7.1 (A) 4.0 - 5.6 %  157 mg/dL   Assessment & Plan     1. Type 2 diabetes mellitus with diabetic neuropathy, without long-term current use of insulin ([place]) Fairly well controlled.   2. Fatigue, unspecified type Multifactorial. Is not having trouble sleeping. Likely exacerbated by anemia and depression.   3. Iron deficiency anemia, unspecified iron deficiency anemia type Advised to start daily iron supplement   4. Moderate episode of recurrent major depressive disorder ([place]) Felt more fatigued from Lexapro and escitalopram. Will try  - buPROPion (WELLBUTRIN XL) 150 MG 24 hr tablet; Take 1 tablet (150 mg total) by mouth daily.  Dispense: 30 tablet; Refill: 2   5. Essential hypertension Well controlled.  Continue current medications.    6. 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   She reports she had flu and covid booster recently at [facility].  Future Appointments  Date Time Provider [place]  04/03/2021  8:45 AM Hayden Pedro, MD TRE-TRE None  04/22/2021  9:40 AM Birdie Sons, MD B[facility]-B[facility] PEC  09/19/2021  9:30 AM [facility] VASC 3 AVVS-IMG None  09/19/2021 10:45 AM Dew, Erskine Squibb, MD AV[facility]-AVVS None  01/12/2022  9:15 AM Hayden Pedro, MD TRE-TRE None        The entirety of the information documented in the History of Present Illness, Review of Systems and Physical Exam were personally obtained by me. Portions of this information were initially documented by the CMA and reviewed by me for thoroughness and accuracy.     Lelon Huh, MD  [facility] [phone] (phone) [phone] (fax)  [place]

## 2021-03-04 NOTE — Patient Instructions (Signed)
Please review the attached list of medications and notify my office if there are any errors.   Start taking OTC iron supplement 325mg  once a day

## 2021-03-11 DIAGNOSIS — C4361 Malignant melanoma of right upper limb, including shoulder: Secondary | ICD-10-CM | POA: Diagnosis not present

## 2021-03-14 ENCOUNTER — Ambulatory Visit: Payer: Self-pay | Admitting: *Deleted

## 2021-03-14 ENCOUNTER — Other Ambulatory Visit: Payer: Self-pay

## 2021-03-14 MED ORDER — PAXLOVID (150/100) 10 X 150 MG & 10 X 100MG PO TBPK: ORAL_TABLET | ORAL | 0 refills | Status: DC

## 2021-03-14 MED ORDER — PAXLOVID (150/100) 10 X 150 MG & 10 X 100MG PO TBPK
ORAL_TABLET | ORAL | 0 refills | Status: DC
  Filled 2021-03-14: qty 20, 5d supply, fill #0

## 2021-03-14 NOTE — Telephone Encounter (Signed)
Resent rx to [facility]. Patient is unable to pick medication up in time, before pharmacy closes.

## 2021-03-14 NOTE — Telephone Encounter (Signed)
Recommend calling [facility] pharmacist at [phone] to discuss Covid treatments

## 2021-03-14 NOTE — Telephone Encounter (Signed)
Pt called saying she tested positive for covid this morning.  She is having chills, congestion and cough.  She wants to know if she can get started on something today for covid.   Called patient to review symptoms. Tested positive for covid with at home test x 2 today. Symptoms started this am. Chills, cough congestion, headache. Unknown fever due to no thermometer. Coughing up clear mucus at this time. Denies chest pain , difficulty breathing. Reports hx COPD , lung CA. Patient requesting medication. Patient reports she is heard a lot of antiviral medications are dangerous and requesting z- pack and prednisone if PCP recommends. Please advise . Reviewed quarantine isolation precautions. Care advise given. Patient verbalized understanding of care advise and to call back or go to [facility] or ED if symptoms worsen.

## 2021-03-14 NOTE — Telephone Encounter (Signed)
Reason for Disposition  [1] HIGH RISK for severe COVID complications (e.g., weak immune system, age > 31 years, obesity with BMI > 25, pregnant, chronic lung disease or other chronic medical condition) AND [2] COVID symptoms (e.g., cough, fever)  (Exceptions: Already seen by PCP and no new or worsening symptoms.)  Answer Assessment - Initial Assessment Questions 1. COVID-19 DIAGNOSIS: "Who made your COVID-19 diagnosis?" "Was it confirmed by a positive lab test or self-test?" If not diagnosed by a doctor (or NP/PA), ask "Are there lots of cases (community spread) where you live?" Note: See public health department website, if unsure.     At home test today positive for covid  2. COVID-19 EXPOSURE: "Was there any known exposure to COVID before the symptoms began?" CDC Definition of close contact: within 6 feet (2 meters) for a total of 15 minutes or more over a 24-hour period.      Went to [facility] and around daughter positive for covid  3. ONSET: "When did the COVID-19 symptoms start?"      Today  4. WORST SYMPTOM: "What is your worst symptom?" (e.g., cough, fever, shortness of breath, muscle aches)     Cough , sneezing , body aches  5. COUGH: "Do you have a cough?" If Yes, ask: "How bad is the cough?"       Yes , productive clear  6. FEVER: "Do you have a fever?" If Yes, ask: "What is your temperature, how was it measured, and when did it start?"     Unknown no thermometer 7. RESPIRATORY STATUS: "Describe your breathing?" (e.g., shortness of breath, wheezing, unable to speak)      Hx COPD  8. BETTER-SAME-WORSE: "Are you getting better, staying the same or getting worse compared to yesterday?"  If getting worse, ask, "In what way?"     Symptoms started today 9. HIGH RISK DISEASE: "Do you have any chronic medical problems?" (e.g., asthma, heart or lung disease, weak immune system, obesity, etc.)     Hx COPD lung CA 10. VACCINE: "Have you had the COVID-19 vaccine?" If Yes, ask: "Which one, how many  shots, when did you get it?"       X2 Moderna  11. BOOSTER: "Have you received your COVID-19 booster?" If Yes, ask: "Which one and when did you get it?"       X 2 Moderna and last one [place]  12. PREGNANCY: "Is there any chance you are pregnant?" "When was your last menstrual period?"       na 13. OTHER SYMPTOMS: "Do you have any other symptoms?"  (e.g., chills, fatigue, headache, loss of smell or taste, muscle pain, sore throat)       Chills, headache , body aches, cough, congestion 14. O2 SATURATION MONITOR:  "Do you use an oxygen saturation monitor (pulse oximeter) at home?" If Yes, ask "What is your reading (oxygen level) today?" "What is your usual oxygen saturation reading?" (e.g., 95%)       na  Protocols used: Coronavirus (COVID-19) Diagnosed or Suspected-A-AH

## 2021-03-18 ENCOUNTER — Telehealth: Payer: Medicare Other | Admitting: Family Medicine

## 2021-03-18 ENCOUNTER — Other Ambulatory Visit: Payer: Self-pay

## 2021-03-26 ENCOUNTER — Telehealth: Payer: Self-pay

## 2021-03-26 DIAGNOSIS — R059 Cough, unspecified: Secondary | ICD-10-CM

## 2021-03-26 MED ORDER — BENZONATATE 100 MG PO CAPS: 100.0000 mg | ORAL_CAPSULE | Freq: Three times a day (TID) | ORAL | 0 refills | Status: DC | PRN

## 2021-03-26 MED ORDER — PREDNISONE 10 MG PO TABS: ORAL_TABLET | ORAL | 0 refills | Status: AC

## 2021-03-26 NOTE — Telephone Encounter (Signed)
Have sent prescription for tessalon and prednisone to [facility]

## 2021-03-26 NOTE — Telephone Encounter (Signed)
Copied from [place] [phone]. Topic: General - Other >> Mar 26, 2021  8:18 AM Leward Quan A wrote: Reason for CRM: Patient called in to inform dr Caryn Section that she was recently diagnosed with Covid about 9 days ago and now testing negative but has an awful productive cough with yellow mucus and asking for an Rx sent to her pharmacy to help her feel better soon. Please call with a response Ph# [phone]

## 2021-03-29 ENCOUNTER — Other Ambulatory Visit: Payer: Self-pay | Admitting: Family Medicine

## 2021-03-29 DIAGNOSIS — J432 Centrilobular emphysema: Secondary | ICD-10-CM

## 2021-03-29 NOTE — Telephone Encounter (Signed)
Requested Prescriptions  Pending Prescriptions Disp Refills  . albuterol (VENTOLIN HFA) 108 (90 Base) MCG/ACT inhaler [Pharmacy Med Name: Albuterol Sulfate HFA 108 (90 Base) MCG/ACT Inhalation Aerosol Solution] 18 g 0    Sig: INHALE 2 PUFFS BY MOUTH EVERY 6 HOURS AS NEEDED FOR WHEEZING AND FOR SHORTNESS OF BREATH     Pulmonology:  Beta Agonists Failed - 03/29/2021 10:33 AM      Failed - One inhaler should last at least one month. If the patient is requesting refills earlier, contact the patient to check for uncontrolled symptoms.      Passed - Valid encounter within last 12 months    Recent Outpatient Visits          3 weeks ago Type 2 diabetes mellitus with diabetic neuropathy, without long-term current use of insulin [facility])   [facility] Birdie Sons, MD   1 month ago Tobacco abuse   [facility] Tally Joe T, FNP   7 months ago Type 2 diabetes mellitus with diabetic neuropathy, without long-term current use of insulin [facility])   [facility] Birdie Sons, MD   10 months ago Cough   [facility] Birdie Sons, MD   1 year ago Type 2 diabetes mellitus with diabetic neuropathy, without long-term current use of insulin [facility])   [facility] Birdie Sons, MD      Future Appointments            In 3 weeks Fisher, Kirstie Peri, MD [facility], [facility]EC

## 2021-04-01 DIAGNOSIS — E7800 Pure hypercholesterolemia, unspecified: Secondary | ICD-10-CM | POA: Diagnosis not present

## 2021-04-01 DIAGNOSIS — C3432 Malignant neoplasm of lower lobe, left bronchus or lung: Secondary | ICD-10-CM | POA: Diagnosis not present

## 2021-04-01 DIAGNOSIS — R918 Other nonspecific abnormal finding of lung field: Secondary | ICD-10-CM | POA: Diagnosis not present

## 2021-04-01 DIAGNOSIS — Z7951 Long term (current) use of inhaled steroids: Secondary | ICD-10-CM | POA: Diagnosis not present

## 2021-04-01 DIAGNOSIS — C4361 Malignant melanoma of right upper limb, including shoulder: Secondary | ICD-10-CM | POA: Diagnosis not present

## 2021-04-01 DIAGNOSIS — Z85828 Personal history of other malignant neoplasm of skin: Secondary | ICD-10-CM | POA: Diagnosis not present

## 2021-04-01 DIAGNOSIS — Z79899 Other long term (current) drug therapy: Secondary | ICD-10-CM | POA: Diagnosis not present

## 2021-04-01 DIAGNOSIS — I10 Essential (primary) hypertension: Secondary | ICD-10-CM | POA: Diagnosis not present

## 2021-04-01 DIAGNOSIS — D649 Anemia, unspecified: Secondary | ICD-10-CM | POA: Diagnosis not present

## 2021-04-01 DIAGNOSIS — E119 Type 2 diabetes mellitus without complications: Secondary | ICD-10-CM | POA: Diagnosis not present

## 2021-04-01 DIAGNOSIS — J449 Chronic obstructive pulmonary disease, unspecified: Secondary | ICD-10-CM | POA: Diagnosis not present

## 2021-04-01 DIAGNOSIS — Z87891 Personal history of nicotine dependence: Secondary | ICD-10-CM | POA: Diagnosis not present

## 2021-04-01 DIAGNOSIS — Z7982 Long term (current) use of aspirin: Secondary | ICD-10-CM | POA: Diagnosis not present

## 2021-04-01 DIAGNOSIS — Z7984 Long term (current) use of oral hypoglycemic drugs: Secondary | ICD-10-CM | POA: Diagnosis not present

## 2021-04-02 DIAGNOSIS — C4361 Malignant melanoma of right upper limb, including shoulder: Secondary | ICD-10-CM | POA: Diagnosis not present

## 2021-04-02 LAB — COMPREHENSIVE METABOLIC PANEL: eGFR: 46

## 2021-04-03 ENCOUNTER — Other Ambulatory Visit: Payer: Self-pay

## 2021-04-03 ENCOUNTER — Encounter (INDEPENDENT_AMBULATORY_CARE_PROVIDER_SITE_OTHER): Payer: Medicare Other | Admitting: Ophthalmology

## 2021-04-03 DIAGNOSIS — H43823 Vitreomacular adhesion, bilateral: Secondary | ICD-10-CM

## 2021-04-03 DIAGNOSIS — H35033 Hypertensive retinopathy, bilateral: Secondary | ICD-10-CM

## 2021-04-03 DIAGNOSIS — H43813 Vitreous degeneration, bilateral: Secondary | ICD-10-CM

## 2021-04-03 DIAGNOSIS — H35073 Retinal telangiectasis, bilateral: Secondary | ICD-10-CM

## 2021-04-03 DIAGNOSIS — I10 Essential (primary) hypertension: Secondary | ICD-10-CM | POA: Diagnosis not present

## 2021-04-07 ENCOUNTER — Other Ambulatory Visit: Payer: Self-pay | Admitting: Family Medicine

## 2021-04-07 DIAGNOSIS — C4361 Malignant melanoma of right upper limb, including shoulder: Secondary | ICD-10-CM | POA: Diagnosis not present

## 2021-04-07 DIAGNOSIS — Z79899 Other long term (current) drug therapy: Secondary | ICD-10-CM | POA: Diagnosis not present

## 2021-04-15 DIAGNOSIS — C4361 Malignant melanoma of right upper limb, including shoulder: Secondary | ICD-10-CM | POA: Diagnosis not present

## 2021-04-15 DIAGNOSIS — Z85828 Personal history of other malignant neoplasm of skin: Secondary | ICD-10-CM | POA: Diagnosis not present

## 2021-04-15 DIAGNOSIS — Z7984 Long term (current) use of oral hypoglycemic drugs: Secondary | ICD-10-CM | POA: Diagnosis not present

## 2021-04-15 DIAGNOSIS — Z79899 Other long term (current) drug therapy: Secondary | ICD-10-CM | POA: Diagnosis not present

## 2021-04-15 DIAGNOSIS — Z7951 Long term (current) use of inhaled steroids: Secondary | ICD-10-CM | POA: Diagnosis not present

## 2021-04-15 DIAGNOSIS — Z87891 Personal history of nicotine dependence: Secondary | ICD-10-CM | POA: Diagnosis not present

## 2021-04-15 DIAGNOSIS — Z7982 Long term (current) use of aspirin: Secondary | ICD-10-CM | POA: Diagnosis not present

## 2021-04-15 DIAGNOSIS — D649 Anemia, unspecified: Secondary | ICD-10-CM | POA: Diagnosis not present

## 2021-04-15 DIAGNOSIS — C3432 Malignant neoplasm of lower lobe, left bronchus or lung: Secondary | ICD-10-CM | POA: Diagnosis not present

## 2021-04-15 DIAGNOSIS — J449 Chronic obstructive pulmonary disease, unspecified: Secondary | ICD-10-CM | POA: Diagnosis not present

## 2021-04-15 DIAGNOSIS — I10 Essential (primary) hypertension: Secondary | ICD-10-CM | POA: Diagnosis not present

## 2021-04-15 DIAGNOSIS — E119 Type 2 diabetes mellitus without complications: Secondary | ICD-10-CM | POA: Diagnosis not present

## 2021-04-15 DIAGNOSIS — E7800 Pure hypercholesterolemia, unspecified: Secondary | ICD-10-CM | POA: Diagnosis not present

## 2021-04-21 DIAGNOSIS — R918 Other nonspecific abnormal finding of lung field: Secondary | ICD-10-CM | POA: Diagnosis not present

## 2021-04-21 DIAGNOSIS — J449 Chronic obstructive pulmonary disease, unspecified: Secondary | ICD-10-CM | POA: Diagnosis not present

## 2021-04-21 DIAGNOSIS — Z79899 Other long term (current) drug therapy: Secondary | ICD-10-CM | POA: Diagnosis not present

## 2021-04-21 DIAGNOSIS — C4361 Malignant melanoma of right upper limb, including shoulder: Secondary | ICD-10-CM | POA: Diagnosis not present

## 2021-04-21 DIAGNOSIS — Z7982 Long term (current) use of aspirin: Secondary | ICD-10-CM | POA: Diagnosis not present

## 2021-04-21 DIAGNOSIS — Z792 Long term (current) use of antibiotics: Secondary | ICD-10-CM | POA: Diagnosis not present

## 2021-04-21 DIAGNOSIS — Z7951 Long term (current) use of inhaled steroids: Secondary | ICD-10-CM | POA: Diagnosis not present

## 2021-04-22 ENCOUNTER — Encounter: Payer: Self-pay | Admitting: Family Medicine

## 2021-04-22 ENCOUNTER — Other Ambulatory Visit: Payer: Self-pay

## 2021-04-22 ENCOUNTER — Ambulatory Visit (INDEPENDENT_AMBULATORY_CARE_PROVIDER_SITE_OTHER): Payer: Medicare Other | Admitting: Family Medicine

## 2021-04-22 VITALS — BP 128/55 | HR 66 | Temp 97.9°F | Resp 20 | Wt 162.0 lb

## 2021-04-22 DIAGNOSIS — J432 Centrilobular emphysema: Secondary | ICD-10-CM

## 2021-04-22 DIAGNOSIS — D509 Iron deficiency anemia, unspecified: Secondary | ICD-10-CM

## 2021-04-22 DIAGNOSIS — C779 Secondary and unspecified malignant neoplasm of lymph node, unspecified: Secondary | ICD-10-CM

## 2021-04-22 DIAGNOSIS — R5383 Other fatigue: Secondary | ICD-10-CM

## 2021-04-22 DIAGNOSIS — F331 Major depressive disorder, recurrent, moderate: Secondary | ICD-10-CM

## 2021-04-22 MED ORDER — BREZTRI AEROSPHERE 160-9-4.8 MCG/ACT IN AERO: 2.0000 | INHALATION_SPRAY | Freq: Two times a day (BID) | RESPIRATORY_TRACT | 0 refills | Status: DC

## 2021-04-22 MED ORDER — ALBUTEROL SULFATE HFA 108 (90 BASE) MCG/ACT IN AERS: 2.0000 | INHALATION_SPRAY | Freq: Four times a day (QID) | RESPIRATORY_TRACT | 4 refills | Status: DC | PRN

## 2021-04-22 NOTE — Patient Instructions (Addendum)
Please review the attached list of medications and notify my office if there are any errors.   Please bring all of your medications to every appointment so we can make sure that our medication list is the same as yours.   Let me know if you blood pressure gets below 110/50. If it does, we will have you reduce the dose of amlodipine and lisinopril/hctz to 1/2 tablet daily  You can try taking samples of Breztri, 1 puff twice a day, in place of Symbicort. You can still take the albuterol as needed.

## 2021-04-22 NOTE — Progress Notes (Signed)
Established patient visit   Patient: Erica Rivas   DOB: 10-15-1940   80 y.o. Female  MRN: [id number] Visit Date: 04/22/2021  Today's healthcare provider: Lelon Huh, MD   Chief Complaint  Patient presents with   Depression   Subjective    HPI  Depression, Follow-up  She  was last seen for this 6 weeks ago. Changes made at last visit include trying buPROPion (WELLBUTRIN XL) 150 MG 24 hr tablet; Take 1 tablet (150 mg total) by mouth daily.   She reports poor compliance with treatment. She is having side effects. (Medication causes patient to feel sleepy, so she stopped taking it after 1 week)  She reports poor tolerance of treatment. Current symptoms include: depressed mood She feels she is Unchanged since last visit.  Depression screen [facility] 2/9 04/22/2021 01/31/2021 08/06/2020  Decreased Interest 2 3 2   Down, Depressed, Hopeless 0 0 2  PHQ - 2 Score 2 3 4   Altered sleeping 3 3 1   Tired, decreased energy 2 3 2   Change in appetite 0 0 0  Feeling bad or failure about yourself  0 0 0  Trouble concentrating 2 2 2   Moving slowly or fidgety/restless 0 0 0  Suicidal thoughts 0 0 0  PHQ-9 Score 9 11 9   Difficult doing work/chores Not difficult at all Somewhat difficult Not difficult at all    -----------------------------------------------------------------------------------------  She is going to be starting radiation treatments next week and prefers not to start any new medications at this time.      Medications: Outpatient Medications Prior to Visit  Medication Sig   albuterol (VENTOLIN HFA) 108 (90 Base) MCG/ACT inhaler INHALE 2 PUFFS BY MOUTH EVERY 6 HOURS AS NEEDED FOR WHEEZING AND FOR SHORTNESS OF BREATH   amitriptyline (ELAVIL) 25 MG tablet TAKE 1 TABLET BY MOUTH AT  BEDTIME   amLODipine (NORVASC) 5 MG tablet TAKE 1 TABLET BY MOUTH  DAILY   aspirin EC 81 MG tablet Take 81 mg by mouth daily.   atenolol (TENORMIN) 50 MG tablet TAKE 1 TABLET BY MOUTH  TWICE  DAILY   atorvastatin (LIPITOR) 80 MG tablet TAKE 1 TABLET BY MOUTH  DAILY   Cholecalciferol (VITAMIN D) 2000 UNITS tablet Take 1,000 Units by mouth daily.    enalapril (VASOTEC) 2.5 MG tablet Take by mouth.   furosemide (LASIX) 20 MG tablet Take 1 tablet (20 mg total) by mouth daily as needed (swelling).   glipiZIDE (GLUCOTROL XL) 10 MG 24 hr tablet TAKE 1 TABLET BY MOUTH  DAILY   Iron, Ferrous Sulfate, 325 (65 Fe) MG TABS Take 325 mg by mouth daily.   lisinopril-hydrochlorothiazide (ZESTORETIC) 20-25 MG tablet TAKE 1 TABLET BY MOUTH  DAILY   metFORMIN (GLUCOPHAGE) 1000 MG tablet TAKE 1 TABLET BY MOUTH  TWICE DAILY   montelukast (SINGULAIR) 10 MG tablet TAKE 1 TABLET BY MOUTH  DAILY   mupirocin ointment (BACTROBAN) 2 % Apply 1 application topically daily. Qd to excision site on right arm until healed   omeprazole (PRILOSEC) 40 MG capsule TAKE 1 CAPSULE BY MOUTH  DAILY   OneTouch Delica Lancets 40J MISC USE TO CHECK BLOOD SUGAR DAILY   ONETOUCH ULTRA test strip USE TO TEST BLOOD SUGAR DAILY.   pravastatin (PRAVACHOL) 40 MG tablet Take by mouth.   SYMBICORT 160-4.5 MCG/ACT inhaler Inhale 2 puffs into the lungs 2 (two) times daily.   valACYclovir (VALTREX) 1000 MG tablet 2 tablets twice a day for 1 day as needed  for cold sores   buPROPion (WELLBUTRIN XL) 150 MG 24 hr tablet Take 1 tablet (150 mg total) by mouth daily. (Patient not taking: Reported on 04/22/2021)   clopidogrel (PLAVIX) 75 MG tablet Take 1 tablet (75 mg total) by mouth daily. (Patient not taking: Reported on 04/22/2021)   [DISCONTINUED] benzonatate (TESSALON) 100 MG capsule Take 1 capsule (100 mg total) by mouth 3 (three) times daily as needed for cough. (Patient not taking: Reported on 04/22/2021)   [DISCONTINUED] nirmatrelvir & ritonavir (PAXLOVID, 150/100,) 10 x 150 MG & 10 x 100MG  TBPK Take 1 nirmatrelvir (150 mg) tablet and take 1 ritonavir (100 mg) tablet (2 tablets total) by mouth two times daily for 5 days (Patient not taking:  Reported on 04/22/2021)   No facility-administered medications prior to visit.    Review of Systems  Constitutional:  Negative for appetite change, chills, fatigue and fever.  Respiratory:  Positive for shortness of breath. Negative for chest tightness.   Cardiovascular:  Negative for chest pain and palpitations.  Gastrointestinal:  Negative for abdominal pain, nausea and vomiting.  Neurological:  Negative for dizziness and weakness.  Psychiatric/Behavioral:  Positive for dysphoric mood.       Objective    BP (!) 128/55 (BP Location: Left Arm, Patient Position: Sitting, Cuff Size: Normal)   Pulse 66   Temp 97.9 F (36.6 C) (Oral)   Resp 20   Wt 162 lb (73.5 kg)   SpO2 100% Comment: room air  BMI 26.96 kg/m  {Show previous vital signs (optional):23777}  Physical Exam   General: Appearance:     Well developed, well nourished female in no acute distress  Eyes:    PERRL, conjunctiva/corneas clear, EOM's intact       Lungs:     Clear to auscultation bilaterally, respirations unlabored  Heart:    Normal heart rate. Normal rhythm. No murmurs, rubs, or gallops.    MS:   All extremities are intact.    Neurologic:   Awake, alert, oriented x 3. No apparent focal neurological defect.          Assessment & Plan     1. Centrilobular emphysema ([place]) She is still having to use albuterol at least once every day. Refilled today. Is compliant with Symbicort twice a day every day.   Will try change from Symbicort to  Budeson-Glycopyrrol-Formoterol (BREZTRI AEROSPHERE) 160-9-4.8 MCG/ACT AERO; Inhale 2 puffs into the lungs 2 (two) times daily. TAKE IN THE PLACE OF SYMBICORT  Dispense: 21.4 g (2 sample inhalers)  2. 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   3. Iron deficiency anemia, unspecified iron deficiency anemia type Is only able to tolerate iron supplements 2-3 days a week, but is taking consistently. Will recheck at  follow up in about 6 weeks.   4. Fatigue, unspecified type Multifactoria, likely exacerbated by depression, but she felt more fatigued when she on trial of bupropion.   5. Moderate episode of recurrent major depressive disorder ([place]) She stopped bupropion due to fatigues, which persists but has improved since stopping medication. She is not interested in starting any new medication right new she she will be starting radiation treatments next week.   Will reassess in 6 weeks, consider trial of venlafaxine at follow up.         The entirety of the information documented in the History of Present Illness, Review of Systems and Physical Exam were personally obtained by me. Portions of  this information were initially documented by the CMA and reviewed by me for thoroughness and accuracy.     Lelon Huh, MD  [facility] [phone] (phone) [phone] (fax)  [place]

## 2021-04-24 DIAGNOSIS — C4361 Malignant melanoma of right upper limb, including shoulder: Secondary | ICD-10-CM | POA: Diagnosis not present

## 2021-04-24 DIAGNOSIS — Z7982 Long term (current) use of aspirin: Secondary | ICD-10-CM | POA: Diagnosis not present

## 2021-04-24 DIAGNOSIS — Z79899 Other long term (current) drug therapy: Secondary | ICD-10-CM | POA: Diagnosis not present

## 2021-04-24 DIAGNOSIS — C3432 Malignant neoplasm of lower lobe, left bronchus or lung: Secondary | ICD-10-CM | POA: Diagnosis not present

## 2021-04-24 DIAGNOSIS — Z87891 Personal history of nicotine dependence: Secondary | ICD-10-CM | POA: Diagnosis not present

## 2021-04-24 DIAGNOSIS — T797XXA Traumatic subcutaneous emphysema, initial encounter: Secondary | ICD-10-CM | POA: Diagnosis not present

## 2021-04-24 DIAGNOSIS — E7800 Pure hypercholesterolemia, unspecified: Secondary | ICD-10-CM | POA: Diagnosis not present

## 2021-04-24 DIAGNOSIS — Z7984 Long term (current) use of oral hypoglycemic drugs: Secondary | ICD-10-CM | POA: Diagnosis not present

## 2021-04-24 DIAGNOSIS — D649 Anemia, unspecified: Secondary | ICD-10-CM | POA: Diagnosis not present

## 2021-04-24 DIAGNOSIS — Z85828 Personal history of other malignant neoplasm of skin: Secondary | ICD-10-CM | POA: Diagnosis not present

## 2021-04-24 DIAGNOSIS — Z792 Long term (current) use of antibiotics: Secondary | ICD-10-CM | POA: Diagnosis not present

## 2021-04-24 DIAGNOSIS — E119 Type 2 diabetes mellitus without complications: Secondary | ICD-10-CM | POA: Diagnosis not present

## 2021-04-24 DIAGNOSIS — I10 Essential (primary) hypertension: Secondary | ICD-10-CM | POA: Diagnosis not present

## 2021-04-24 DIAGNOSIS — E869 Volume depletion, unspecified: Secondary | ICD-10-CM | POA: Diagnosis not present

## 2021-04-24 DIAGNOSIS — J449 Chronic obstructive pulmonary disease, unspecified: Secondary | ICD-10-CM | POA: Diagnosis not present

## 2021-04-24 DIAGNOSIS — Z7951 Long term (current) use of inhaled steroids: Secondary | ICD-10-CM | POA: Diagnosis not present

## 2021-04-27 DIAGNOSIS — E119 Type 2 diabetes mellitus without complications: Secondary | ICD-10-CM | POA: Diagnosis not present

## 2021-04-27 DIAGNOSIS — Z79899 Other long term (current) drug therapy: Secondary | ICD-10-CM | POA: Diagnosis not present

## 2021-04-27 DIAGNOSIS — I10 Essential (primary) hypertension: Secondary | ICD-10-CM | POA: Diagnosis not present

## 2021-04-27 DIAGNOSIS — Z7984 Long term (current) use of oral hypoglycemic drugs: Secondary | ICD-10-CM | POA: Diagnosis not present

## 2021-04-27 DIAGNOSIS — T797XXA Traumatic subcutaneous emphysema, initial encounter: Secondary | ICD-10-CM | POA: Diagnosis not present

## 2021-04-27 DIAGNOSIS — E7800 Pure hypercholesterolemia, unspecified: Secondary | ICD-10-CM | POA: Diagnosis not present

## 2021-04-27 DIAGNOSIS — Z7951 Long term (current) use of inhaled steroids: Secondary | ICD-10-CM | POA: Diagnosis not present

## 2021-04-27 DIAGNOSIS — C3432 Malignant neoplasm of lower lobe, left bronchus or lung: Secondary | ICD-10-CM | POA: Diagnosis not present

## 2021-04-27 DIAGNOSIS — C4361 Malignant melanoma of right upper limb, including shoulder: Secondary | ICD-10-CM | POA: Diagnosis not present

## 2021-04-27 DIAGNOSIS — E869 Volume depletion, unspecified: Secondary | ICD-10-CM | POA: Diagnosis not present

## 2021-04-27 DIAGNOSIS — J449 Chronic obstructive pulmonary disease, unspecified: Secondary | ICD-10-CM | POA: Diagnosis not present

## 2021-04-27 DIAGNOSIS — Z792 Long term (current) use of antibiotics: Secondary | ICD-10-CM | POA: Diagnosis not present

## 2021-04-27 DIAGNOSIS — Z85828 Personal history of other malignant neoplasm of skin: Secondary | ICD-10-CM | POA: Diagnosis not present

## 2021-04-27 DIAGNOSIS — Z87891 Personal history of nicotine dependence: Secondary | ICD-10-CM | POA: Diagnosis not present

## 2021-04-27 DIAGNOSIS — D649 Anemia, unspecified: Secondary | ICD-10-CM | POA: Diagnosis not present

## 2021-04-27 DIAGNOSIS — Z7982 Long term (current) use of aspirin: Secondary | ICD-10-CM | POA: Diagnosis not present

## 2021-04-28 DIAGNOSIS — I10 Essential (primary) hypertension: Secondary | ICD-10-CM | POA: Diagnosis not present

## 2021-04-28 DIAGNOSIS — C4361 Malignant melanoma of right upper limb, including shoulder: Secondary | ICD-10-CM | POA: Diagnosis not present

## 2021-04-28 DIAGNOSIS — E119 Type 2 diabetes mellitus without complications: Secondary | ICD-10-CM | POA: Diagnosis not present

## 2021-04-28 DIAGNOSIS — E7800 Pure hypercholesterolemia, unspecified: Secondary | ICD-10-CM | POA: Diagnosis not present

## 2021-04-28 DIAGNOSIS — T797XXA Traumatic subcutaneous emphysema, initial encounter: Secondary | ICD-10-CM | POA: Diagnosis not present

## 2021-04-28 DIAGNOSIS — D649 Anemia, unspecified: Secondary | ICD-10-CM | POA: Diagnosis not present

## 2021-04-28 DIAGNOSIS — Z7982 Long term (current) use of aspirin: Secondary | ICD-10-CM | POA: Diagnosis not present

## 2021-04-28 DIAGNOSIS — C3432 Malignant neoplasm of lower lobe, left bronchus or lung: Secondary | ICD-10-CM | POA: Diagnosis not present

## 2021-04-28 DIAGNOSIS — Z85828 Personal history of other malignant neoplasm of skin: Secondary | ICD-10-CM | POA: Diagnosis not present

## 2021-04-28 DIAGNOSIS — Z87891 Personal history of nicotine dependence: Secondary | ICD-10-CM | POA: Diagnosis not present

## 2021-04-28 DIAGNOSIS — J449 Chronic obstructive pulmonary disease, unspecified: Secondary | ICD-10-CM | POA: Diagnosis not present

## 2021-04-28 DIAGNOSIS — Z792 Long term (current) use of antibiotics: Secondary | ICD-10-CM | POA: Diagnosis not present

## 2021-04-28 DIAGNOSIS — E869 Volume depletion, unspecified: Secondary | ICD-10-CM | POA: Diagnosis not present

## 2021-04-28 DIAGNOSIS — Z79899 Other long term (current) drug therapy: Secondary | ICD-10-CM | POA: Diagnosis not present

## 2021-04-28 DIAGNOSIS — Z7951 Long term (current) use of inhaled steroids: Secondary | ICD-10-CM | POA: Diagnosis not present

## 2021-04-28 DIAGNOSIS — Z7984 Long term (current) use of oral hypoglycemic drugs: Secondary | ICD-10-CM | POA: Diagnosis not present

## 2021-04-29 DIAGNOSIS — C4361 Malignant melanoma of right upper limb, including shoulder: Secondary | ICD-10-CM | POA: Diagnosis not present

## 2021-04-29 DIAGNOSIS — Z85828 Personal history of other malignant neoplasm of skin: Secondary | ICD-10-CM | POA: Diagnosis not present

## 2021-04-29 DIAGNOSIS — Z7984 Long term (current) use of oral hypoglycemic drugs: Secondary | ICD-10-CM | POA: Diagnosis not present

## 2021-04-29 DIAGNOSIS — Z792 Long term (current) use of antibiotics: Secondary | ICD-10-CM | POA: Diagnosis not present

## 2021-04-29 DIAGNOSIS — Z87891 Personal history of nicotine dependence: Secondary | ICD-10-CM | POA: Diagnosis not present

## 2021-04-29 DIAGNOSIS — C3432 Malignant neoplasm of lower lobe, left bronchus or lung: Secondary | ICD-10-CM | POA: Diagnosis not present

## 2021-04-29 DIAGNOSIS — E7800 Pure hypercholesterolemia, unspecified: Secondary | ICD-10-CM | POA: Diagnosis not present

## 2021-04-29 DIAGNOSIS — Z7951 Long term (current) use of inhaled steroids: Secondary | ICD-10-CM | POA: Diagnosis not present

## 2021-04-29 DIAGNOSIS — J449 Chronic obstructive pulmonary disease, unspecified: Secondary | ICD-10-CM | POA: Diagnosis not present

## 2021-04-29 DIAGNOSIS — Z79899 Other long term (current) drug therapy: Secondary | ICD-10-CM | POA: Diagnosis not present

## 2021-04-29 DIAGNOSIS — T797XXA Traumatic subcutaneous emphysema, initial encounter: Secondary | ICD-10-CM | POA: Diagnosis not present

## 2021-04-29 DIAGNOSIS — Z7982 Long term (current) use of aspirin: Secondary | ICD-10-CM | POA: Diagnosis not present

## 2021-04-29 DIAGNOSIS — E119 Type 2 diabetes mellitus without complications: Secondary | ICD-10-CM | POA: Diagnosis not present

## 2021-04-29 DIAGNOSIS — D649 Anemia, unspecified: Secondary | ICD-10-CM | POA: Diagnosis not present

## 2021-04-29 DIAGNOSIS — I10 Essential (primary) hypertension: Secondary | ICD-10-CM | POA: Diagnosis not present

## 2021-04-29 DIAGNOSIS — E869 Volume depletion, unspecified: Secondary | ICD-10-CM | POA: Diagnosis not present

## 2021-05-01 DIAGNOSIS — E869 Volume depletion, unspecified: Secondary | ICD-10-CM | POA: Diagnosis not present

## 2021-05-01 DIAGNOSIS — Z7984 Long term (current) use of oral hypoglycemic drugs: Secondary | ICD-10-CM | POA: Diagnosis not present

## 2021-05-01 DIAGNOSIS — C3432 Malignant neoplasm of lower lobe, left bronchus or lung: Secondary | ICD-10-CM | POA: Diagnosis not present

## 2021-05-01 DIAGNOSIS — J449 Chronic obstructive pulmonary disease, unspecified: Secondary | ICD-10-CM | POA: Diagnosis not present

## 2021-05-01 DIAGNOSIS — D649 Anemia, unspecified: Secondary | ICD-10-CM | POA: Diagnosis not present

## 2021-05-01 DIAGNOSIS — E7800 Pure hypercholesterolemia, unspecified: Secondary | ICD-10-CM | POA: Diagnosis not present

## 2021-05-01 DIAGNOSIS — E119 Type 2 diabetes mellitus without complications: Secondary | ICD-10-CM | POA: Diagnosis not present

## 2021-05-01 DIAGNOSIS — Z792 Long term (current) use of antibiotics: Secondary | ICD-10-CM | POA: Diagnosis not present

## 2021-05-01 DIAGNOSIS — Z7982 Long term (current) use of aspirin: Secondary | ICD-10-CM | POA: Diagnosis not present

## 2021-05-01 DIAGNOSIS — Z87891 Personal history of nicotine dependence: Secondary | ICD-10-CM | POA: Diagnosis not present

## 2021-05-01 DIAGNOSIS — Z7951 Long term (current) use of inhaled steroids: Secondary | ICD-10-CM | POA: Diagnosis not present

## 2021-05-01 DIAGNOSIS — Z85828 Personal history of other malignant neoplasm of skin: Secondary | ICD-10-CM | POA: Diagnosis not present

## 2021-05-01 DIAGNOSIS — Z79899 Other long term (current) drug therapy: Secondary | ICD-10-CM | POA: Diagnosis not present

## 2021-05-01 DIAGNOSIS — I10 Essential (primary) hypertension: Secondary | ICD-10-CM | POA: Diagnosis not present

## 2021-05-01 DIAGNOSIS — T797XXA Traumatic subcutaneous emphysema, initial encounter: Secondary | ICD-10-CM | POA: Diagnosis not present

## 2021-05-01 DIAGNOSIS — C4361 Malignant melanoma of right upper limb, including shoulder: Secondary | ICD-10-CM | POA: Diagnosis not present

## 2021-05-02 ENCOUNTER — Encounter (INDEPENDENT_AMBULATORY_CARE_PROVIDER_SITE_OTHER): Payer: Medicare Other | Admitting: Ophthalmology

## 2021-05-02 DIAGNOSIS — Z792 Long term (current) use of antibiotics: Secondary | ICD-10-CM | POA: Diagnosis not present

## 2021-05-02 DIAGNOSIS — T797XXA Traumatic subcutaneous emphysema, initial encounter: Secondary | ICD-10-CM | POA: Diagnosis not present

## 2021-05-02 DIAGNOSIS — Z7951 Long term (current) use of inhaled steroids: Secondary | ICD-10-CM | POA: Diagnosis not present

## 2021-05-02 DIAGNOSIS — Z79899 Other long term (current) drug therapy: Secondary | ICD-10-CM | POA: Diagnosis not present

## 2021-05-02 DIAGNOSIS — Z7982 Long term (current) use of aspirin: Secondary | ICD-10-CM | POA: Diagnosis not present

## 2021-05-02 DIAGNOSIS — E119 Type 2 diabetes mellitus without complications: Secondary | ICD-10-CM | POA: Diagnosis not present

## 2021-05-02 DIAGNOSIS — E869 Volume depletion, unspecified: Secondary | ICD-10-CM | POA: Diagnosis not present

## 2021-05-02 DIAGNOSIS — D649 Anemia, unspecified: Secondary | ICD-10-CM | POA: Diagnosis not present

## 2021-05-02 DIAGNOSIS — I10 Essential (primary) hypertension: Secondary | ICD-10-CM | POA: Diagnosis not present

## 2021-05-02 DIAGNOSIS — Z85828 Personal history of other malignant neoplasm of skin: Secondary | ICD-10-CM | POA: Diagnosis not present

## 2021-05-02 DIAGNOSIS — J449 Chronic obstructive pulmonary disease, unspecified: Secondary | ICD-10-CM | POA: Diagnosis not present

## 2021-05-02 DIAGNOSIS — C3432 Malignant neoplasm of lower lobe, left bronchus or lung: Secondary | ICD-10-CM | POA: Diagnosis not present

## 2021-05-02 DIAGNOSIS — C4361 Malignant melanoma of right upper limb, including shoulder: Secondary | ICD-10-CM | POA: Diagnosis not present

## 2021-05-02 DIAGNOSIS — Z87891 Personal history of nicotine dependence: Secondary | ICD-10-CM | POA: Diagnosis not present

## 2021-05-02 DIAGNOSIS — Z7984 Long term (current) use of oral hypoglycemic drugs: Secondary | ICD-10-CM | POA: Diagnosis not present

## 2021-05-02 DIAGNOSIS — E7800 Pure hypercholesterolemia, unspecified: Secondary | ICD-10-CM | POA: Diagnosis not present

## 2021-05-05 DIAGNOSIS — Z87891 Personal history of nicotine dependence: Secondary | ICD-10-CM | POA: Diagnosis not present

## 2021-05-05 DIAGNOSIS — Z7982 Long term (current) use of aspirin: Secondary | ICD-10-CM | POA: Diagnosis not present

## 2021-05-05 DIAGNOSIS — I10 Essential (primary) hypertension: Secondary | ICD-10-CM | POA: Diagnosis not present

## 2021-05-05 DIAGNOSIS — Z7984 Long term (current) use of oral hypoglycemic drugs: Secondary | ICD-10-CM | POA: Diagnosis not present

## 2021-05-05 DIAGNOSIS — E7800 Pure hypercholesterolemia, unspecified: Secondary | ICD-10-CM | POA: Diagnosis not present

## 2021-05-05 DIAGNOSIS — E869 Volume depletion, unspecified: Secondary | ICD-10-CM | POA: Diagnosis not present

## 2021-05-05 DIAGNOSIS — D649 Anemia, unspecified: Secondary | ICD-10-CM | POA: Diagnosis not present

## 2021-05-05 DIAGNOSIS — Z85828 Personal history of other malignant neoplasm of skin: Secondary | ICD-10-CM | POA: Diagnosis not present

## 2021-05-05 DIAGNOSIS — C4361 Malignant melanoma of right upper limb, including shoulder: Secondary | ICD-10-CM | POA: Diagnosis not present

## 2021-05-05 DIAGNOSIS — Z79899 Other long term (current) drug therapy: Secondary | ICD-10-CM | POA: Diagnosis not present

## 2021-05-05 DIAGNOSIS — Z792 Long term (current) use of antibiotics: Secondary | ICD-10-CM | POA: Diagnosis not present

## 2021-05-05 DIAGNOSIS — J449 Chronic obstructive pulmonary disease, unspecified: Secondary | ICD-10-CM | POA: Diagnosis not present

## 2021-05-05 DIAGNOSIS — E119 Type 2 diabetes mellitus without complications: Secondary | ICD-10-CM | POA: Diagnosis not present

## 2021-05-05 DIAGNOSIS — Z7951 Long term (current) use of inhaled steroids: Secondary | ICD-10-CM | POA: Diagnosis not present

## 2021-05-05 DIAGNOSIS — T797XXA Traumatic subcutaneous emphysema, initial encounter: Secondary | ICD-10-CM | POA: Diagnosis not present

## 2021-05-05 DIAGNOSIS — C3432 Malignant neoplasm of lower lobe, left bronchus or lung: Secondary | ICD-10-CM | POA: Diagnosis not present

## 2021-05-06 ENCOUNTER — Encounter (INDEPENDENT_AMBULATORY_CARE_PROVIDER_SITE_OTHER): Payer: Medicare Other | Admitting: Ophthalmology

## 2021-05-06 ENCOUNTER — Other Ambulatory Visit: Payer: Self-pay

## 2021-05-06 DIAGNOSIS — H43823 Vitreomacular adhesion, bilateral: Secondary | ICD-10-CM

## 2021-05-06 DIAGNOSIS — H35033 Hypertensive retinopathy, bilateral: Secondary | ICD-10-CM

## 2021-05-06 DIAGNOSIS — I10 Essential (primary) hypertension: Secondary | ICD-10-CM

## 2021-05-06 DIAGNOSIS — H35073 Retinal telangiectasis, bilateral: Secondary | ICD-10-CM | POA: Diagnosis not present

## 2021-05-26 ENCOUNTER — Other Ambulatory Visit: Payer: Self-pay | Admitting: Family Medicine

## 2021-05-26 DIAGNOSIS — C4361 Malignant melanoma of right upper limb, including shoulder: Secondary | ICD-10-CM | POA: Diagnosis not present

## 2021-05-26 DIAGNOSIS — Z5111 Encounter for antineoplastic chemotherapy: Secondary | ICD-10-CM | POA: Diagnosis not present

## 2021-05-26 DIAGNOSIS — Z79899 Other long term (current) drug therapy: Secondary | ICD-10-CM | POA: Diagnosis not present

## 2021-06-04 ENCOUNTER — Other Ambulatory Visit: Payer: Self-pay

## 2021-06-04 ENCOUNTER — Encounter: Payer: Self-pay | Admitting: Family Medicine

## 2021-06-04 ENCOUNTER — Ambulatory Visit (INDEPENDENT_AMBULATORY_CARE_PROVIDER_SITE_OTHER): Payer: Medicare Other | Admitting: Family Medicine

## 2021-06-04 VITALS — BP 130/59 | HR 71 | Temp 98.1°F | Resp 20 | Wt 164.0 lb

## 2021-06-04 DIAGNOSIS — C779 Secondary and unspecified malignant neoplasm of lymph node, unspecified: Secondary | ICD-10-CM | POA: Diagnosis not present

## 2021-06-04 DIAGNOSIS — E1140 Type 2 diabetes mellitus with diabetic neuropathy, unspecified: Secondary | ICD-10-CM | POA: Diagnosis not present

## 2021-06-04 DIAGNOSIS — J432 Centrilobular emphysema: Secondary | ICD-10-CM

## 2021-06-04 DIAGNOSIS — D509 Iron deficiency anemia, unspecified: Secondary | ICD-10-CM | POA: Diagnosis not present

## 2021-06-04 NOTE — Progress Notes (Signed)
Established patient visit   Patient: Erica Rivas   DOB: [birth year]   81 y.o. Female  MRN: [id number] Visit Date: 06/04/2021  Today's healthcare provider: Lelon Huh, MD   Chief Complaint  Patient presents with   Depression   Anemia   Subjective    HPI  Depression, Follow-up  She  was last seen for this 6 weeks ago. Changes made at last visit includes stopping bupropion due to fatigues. Did not start any new medications due to starting radiation the following week.    She reports good compliance with treatment. She is not currently taking any anti Depressants. She is not having side effects.   She reports poor tolerance of treatment. Current symptoms include: depressed mood She feels she is Unchanged since last visit.  Depression screen [facility] 2/9 06/04/2021 04/22/2021 01/31/2021  Decreased Interest 0 2 3  Down, Depressed, Hopeless 2 0 0  PHQ - 2 Score 2 2 3   Altered sleeping 0 3 3  Tired, decreased energy 2 2 3   Change in appetite 0 0 0  Feeling bad or failure about yourself  0 0 0  Trouble concentrating 0 2 2  Moving slowly or fidgety/restless 0 0 0  Suicidal thoughts 0 0 0  PHQ-9 Score 4 9 11   Difficult doing work/chores Not difficult at all Not difficult at all Somewhat difficult  Some recent data might be hidden    -----------------------------------------------------------------------------------------  Follow up for iron deficiency anemia:  The patient was last seen for this on 04/22/2021.   Changes made at last visit include none. Patient advised to continue taking oral iron supplement.  She reports poor compliance with treatment. Patient has not been taking iron supplements due to intolerance. She states it makes her nauseated and have diarrhea.  She feels that condition is Unchanged. She is having side effects.   -----------------------------------------------------------------------------------------   Follow up for Centrilobular  emphysema:  The patient was last seen for this 6 weeks ago. Changes made at last visit include change from Symbicort to  Budeson-Glycopyrrol-Formoterol (BREZTRI AEROSPHERE) 160-9-4.8 MCG/ACT AERO; Inhale 2 puffs into the lungs 2 (two) times daily..  She reports poor compliance with treatment. Patient did not start Breztri due to the cost. She has continued to use Symbicort inhaler which usually helps, but not as much as the [employer].  She feels that condition is Unchanged. She is not having side effects.   -----------------------------------------------------------------------------------------    Medications: Outpatient Medications Prior to Visit  Medication Sig   albuterol (VENTOLIN HFA) 108 (90 Base) MCG/ACT inhaler Inhale 2 puffs into the lungs every 6 (six) hours as needed for wheezing or shortness of breath.   amLODipine (NORVASC) 5 MG tablet TAKE 1 TABLET BY MOUTH  DAILY   aspirin EC 81 MG tablet Take 81 mg by mouth daily.   atenolol (TENORMIN) 50 MG tablet TAKE 1 TABLET BY MOUTH  TWICE DAILY   atorvastatin (LIPITOR) 80 MG tablet TAKE 1 TABLET BY MOUTH  DAILY   Cholecalciferol (VITAMIN D) 2000 UNITS tablet Take 1,000 Units by mouth daily.    glipiZIDE (GLUCOTROL XL) 10 MG 24 hr tablet TAKE 1 TABLET BY MOUTH  DAILY   ketorolac (ACULAR) 0.5 % ophthalmic solution 1 drop 4 (four) times daily.   lisinopril-hydrochlorothiazide (ZESTORETIC) 20-25 MG tablet TAKE 1 TABLET BY MOUTH  DAILY   metFORMIN (GLUCOPHAGE) 1000 MG tablet TAKE 1 TABLET BY MOUTH  TWICE DAILY   montelukast (SINGULAIR) 10 MG tablet TAKE 1 TABLET  BY MOUTH  DAILY   omeprazole (PRILOSEC) 40 MG capsule TAKE 1 CAPSULE BY MOUTH  DAILY   OneTouch Delica Lancets 43X MISC USE TO CHECK BLOOD SUGAR DAILY   ONETOUCH ULTRA test strip USE TO TEST BLOOD SUGAR DAILY.   prednisoLONE acetate (PRED FORTE) 1 % ophthalmic suspension 1 drop 4 (four) times daily.   SYMBICORT 160-4.5 MCG/ACT inhaler Inhale 2 puffs into the lungs 2 (two) times  daily.   valACYclovir (VALTREX) 1000 MG tablet 2 tablets twice a day for 1 day as needed for cold sores   amitriptyline (ELAVIL) 25 MG tablet TAKE 1 TABLET BY MOUTH AT  BEDTIME (Patient not taking: Reported on 06/04/2021)   Budeson-Glycopyrrol-Formoterol (BREZTRI AEROSPHERE) 160-9-4.8 MCG/ACT AERO Inhale 2 puffs into the lungs 2 (two) times daily. TAKE IN THE PLACE OF SYMBICORT (Patient not taking: Reported on 06/04/2021)   clopidogrel (PLAVIX) 75 MG tablet Take 1 tablet (75 mg total) by mouth daily. (Patient not taking: Reported on 06/04/2021)   enalapril (VASOTEC) 2.5 MG tablet Take by mouth. (Patient not taking: Reported on 06/04/2021)   furosemide (LASIX) 20 MG tablet Take 1 tablet (20 mg total) by mouth daily as needed (swelling). (Patient not taking: Reported on 06/04/2021)   Iron, Ferrous Sulfate, 325 (65 Fe) MG TABS Take 325 mg by mouth daily. (Patient not taking: Reported on 06/04/2021)   [DISCONTINUED] mupirocin ointment (BACTROBAN) 2 % Apply 1 application topically daily. Qd to excision site on right arm until healed   [DISCONTINUED] pravastatin (PRAVACHOL) 40 MG tablet Take by mouth.   No facility-administered medications prior to visit.    Review of Systems  Constitutional:  Negative for appetite change, chills, fatigue and fever.  Respiratory:  Positive for cough and shortness of breath. Negative for chest tightness.   Cardiovascular:  Negative for chest pain and palpitations.  Gastrointestinal:  Negative for abdominal pain, nausea and vomiting.  Neurological:  Negative for dizziness and weakness.      Objective    BP (!) 130/59 (BP Location: Left Arm, Patient Position: Sitting, Cuff Size: Normal)    Pulse 71    Temp 98.1 F (36.7 C) (Oral)    Resp 20    Wt 164 lb (74.4 kg)    SpO2 99% Comment: room air   BMI 27.29 kg/m  {Show previous vital signs (optional):23777}  Physical Exam    General: Appearance:     Well developed, well nourished female in no acute distress  Eyes:     PERRL, conjunctiva/corneas clear, EOM's intact       Lungs:     Clear to auscultation bilaterally, respirations unlabored  Heart:    Normal heart rate. Normal rhythm. II/VI systolic murmur RUSB, no rubs, or gallops.    MS:   All extremities are intact.    Neurologic:   Awake, alert, oriented x 3. No apparent focal neurological defect.          Assessment & Plan     1. Iron deficiency anemia, unspecified iron deficiency anemia type Intolerant to oral iron, but is consuming more iron rich foods, especially green leafy vegetables in diet.   - CBC - Iron, TIBC and Ferritin Panel  2. Type 2 diabetes mellitus with diabetic neuropathy, without long-term current use of insulin (HCC)  - Hemoglobin A1c  3. Centrilobular emphysema ([place]) Is doing fairly well with Symbicort, but was doing much better with Judithann Sauger which is on a higher tier and cost prohibitive. Have her two samples of Breztri which she  would take when the Symbicort isn't enough. Consider adding a stand alone LAMA if needed.   4. 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 She doesn't feel like she needs any additional antidepressants at this time.   Will schedule follow up after reviewing labs.      The entirety of the information documented in the History of Present Illness, Review of Systems and Physical Exam were personally obtained by me. Portions of this information were initially documented by the CMA and reviewed by me for thoroughness and accuracy.     Lelon Huh, MD  [facility] [phone] (phone) [phone] (fax)  [place]

## 2021-06-05 ENCOUNTER — Other Ambulatory Visit: Payer: Self-pay | Admitting: Family Medicine

## 2021-06-05 LAB — IRON,TIBC AND FERRITIN PANEL
Ferritin: 14 ng/mL — ABNORMAL LOW (ref 15–150)
Iron Saturation: 12 % — ABNORMAL LOW (ref 15–55)
Iron: 47 ug/dL (ref 27–139)
Total Iron Binding Capacity: 388 ug/dL (ref 250–450)
UIBC: 341 ug/dL (ref 118–369)

## 2021-06-05 LAB — CBC
Hematocrit: 29.9 % — ABNORMAL LOW (ref 34.0–46.6)
Hemoglobin: 9.5 g/dL — ABNORMAL LOW (ref 11.1–15.9)
MCH: 26 pg — ABNORMAL LOW (ref 26.6–33.0)
MCHC: 31.8 g/dL (ref 31.5–35.7)
MCV: 82 fL (ref 79–97)
Platelets: 319 10*3/uL (ref 150–450)
RBC: 3.65 x10E6/uL — ABNORMAL LOW (ref 3.77–5.28)
RDW: 15.9 % — ABNORMAL HIGH (ref 11.7–15.4)
WBC: 12.6 10*3/uL — ABNORMAL HIGH (ref 3.4–10.8)

## 2021-06-05 LAB — HEMOGLOBIN A1C
Est. average glucose Bld gHb Est-mCnc: 174 mg/dL
Hgb A1c MFr Bld: 7.7 % — ABNORMAL HIGH (ref 4.8–5.6)

## 2021-06-11 ENCOUNTER — Telehealth: Payer: Self-pay | Admitting: Family Medicine

## 2021-06-11 NOTE — Telephone Encounter (Signed)
I called and spoke with patient. Patient reports that she stopped taking amitriptyline a while back. I have discontinued amitriptyline from her active medication list.

## 2021-06-11 NOTE — Telephone Encounter (Signed)
Received fax from [facility] that 25mg  amitriptyline is out of stock... but her medication list says she stopped taking it. Please confirm if she is still taking it or not. If she's not then d/c from her medication list. If she is still taking we will need to send prescription for different dose

## 2021-06-16 ENCOUNTER — Telehealth: Payer: Self-pay | Admitting: Family Medicine

## 2021-06-16 DIAGNOSIS — C4361 Malignant melanoma of right upper limb, including shoulder: Secondary | ICD-10-CM | POA: Diagnosis not present

## 2021-06-16 DIAGNOSIS — Z7951 Long term (current) use of inhaled steroids: Secondary | ICD-10-CM | POA: Diagnosis not present

## 2021-06-16 DIAGNOSIS — C3490 Malignant neoplasm of unspecified part of unspecified bronchus or lung: Secondary | ICD-10-CM | POA: Diagnosis not present

## 2021-06-16 DIAGNOSIS — J449 Chronic obstructive pulmonary disease, unspecified: Secondary | ICD-10-CM | POA: Diagnosis not present

## 2021-06-16 DIAGNOSIS — Z79899 Other long term (current) drug therapy: Secondary | ICD-10-CM | POA: Diagnosis not present

## 2021-06-16 DIAGNOSIS — Z7982 Long term (current) use of aspirin: Secondary | ICD-10-CM | POA: Diagnosis not present

## 2021-06-16 DIAGNOSIS — Z792 Long term (current) use of antibiotics: Secondary | ICD-10-CM | POA: Diagnosis not present

## 2021-06-16 NOTE — Telephone Encounter (Signed)
Error

## 2021-06-19 ENCOUNTER — Encounter (INDEPENDENT_AMBULATORY_CARE_PROVIDER_SITE_OTHER): Payer: Medicare Other | Admitting: Ophthalmology

## 2021-06-19 ENCOUNTER — Other Ambulatory Visit: Payer: Self-pay

## 2021-06-19 DIAGNOSIS — H35373 Puckering of macula, bilateral: Secondary | ICD-10-CM

## 2021-06-19 DIAGNOSIS — I10 Essential (primary) hypertension: Secondary | ICD-10-CM | POA: Diagnosis not present

## 2021-06-19 DIAGNOSIS — H43813 Vitreous degeneration, bilateral: Secondary | ICD-10-CM

## 2021-06-19 DIAGNOSIS — H35033 Hypertensive retinopathy, bilateral: Secondary | ICD-10-CM

## 2021-06-19 DIAGNOSIS — H35073 Retinal telangiectasis, bilateral: Secondary | ICD-10-CM | POA: Diagnosis not present

## 2021-06-20 ENCOUNTER — Telehealth: Payer: Self-pay

## 2021-06-20 NOTE — Telephone Encounter (Signed)
Copied from [place] [id number]. Topic: Appointment Scheduling - Scheduling Inquiry for Clinic >> Jun 19, 2021  1:51 PM Oneta Rack wrote: Reason for CRM: Skin cancer doctor advised patient to follow up with PCP as soon as possible to due to a spot detected on the left side of her tongue,

## 2021-06-23 ENCOUNTER — Other Ambulatory Visit: Payer: Self-pay

## 2021-06-23 ENCOUNTER — Ambulatory Visit (INDEPENDENT_AMBULATORY_CARE_PROVIDER_SITE_OTHER): Payer: Medicare Other | Admitting: Family Medicine

## 2021-06-23 ENCOUNTER — Encounter: Payer: Self-pay | Admitting: Family Medicine

## 2021-06-23 VITALS — BP 125/53 | HR 84 | Temp 98.2°F | Resp 18 | Wt 166.0 lb

## 2021-06-23 DIAGNOSIS — C4361 Malignant melanoma of right upper limb, including shoulder: Secondary | ICD-10-CM

## 2021-06-23 LAB — HM DIABETES EYE EXAM

## 2021-06-23 NOTE — Progress Notes (Signed)
Established patient visit   Patient: Erica Rivas   DOB: 02-12-1941   81 y.o. Female  MRN: [id number] Visit Date: 06/23/2021  Today's healthcare provider: Lelon Huh, MD   Chief Complaint  Patient presents with   Mouth Lesions   Subjective    HPI  Oral lesion: Patient says she was seen by Surgical Oncology last week at [facility] for a PET CT whole body scan. Per PET/CT report: "increased FDG uptake in the anterior left tongue without definite CT correlate" Patient says she was advised to follow up with her PCP regarding a spot on the side of her tongue.   She denies having any sores or lesions on her tongue that she has noticed.   Medications: Outpatient Medications Prior to Visit  Medication Sig   albuterol (VENTOLIN HFA) 108 (90 Base) MCG/ACT inhaler Inhale 2 puffs into the lungs every 6 (six) hours as needed for wheezing or shortness of breath.   amLODipine (NORVASC) 5 MG tablet TAKE 1 TABLET BY MOUTH  DAILY   aspirin EC 81 MG tablet Take 81 mg by mouth daily.   atenolol (TENORMIN) 50 MG tablet TAKE 1 TABLET BY MOUTH  TWICE DAILY   atorvastatin (LIPITOR) 80 MG tablet TAKE 1 TABLET BY MOUTH  DAILY   Budeson-Glycopyrrol-Formoterol (BREZTRI AEROSPHERE) 160-9-4.8 MCG/ACT AERO Inhale 2 puffs into the lungs 2 (two) times daily. TAKE IN THE PLACE OF SYMBICORT   Cholecalciferol (VITAMIN D) 2000 UNITS tablet Take 1,000 Units by mouth daily.    clopidogrel (PLAVIX) 75 MG tablet Take 1 tablet (75 mg total) by mouth daily.   enalapril (VASOTEC) 2.5 MG tablet Take by mouth.   furosemide (LASIX) 20 MG tablet Take 1 tablet (20 mg total) by mouth daily as needed (swelling).   glipiZIDE (GLUCOTROL XL) 10 MG 24 hr tablet TAKE 1 TABLET BY MOUTH  DAILY   ketorolac (ACULAR) 0.5 % ophthalmic solution 1 drop 4 (four) times daily.   lisinopril-hydrochlorothiazide (ZESTORETIC) 20-25 MG tablet TAKE 1 TABLET BY MOUTH  DAILY   metFORMIN (GLUCOPHAGE) 1000 MG tablet TAKE 1 TABLET BY MOUTH  TWICE DAILY    montelukast (SINGULAIR) 10 MG tablet TAKE 1 TABLET BY MOUTH  DAILY   omeprazole (PRILOSEC) 40 MG capsule TAKE 1 CAPSULE BY MOUTH  DAILY   OneTouch Delica Lancets 45Y MISC USE TO CHECK BLOOD SUGAR DAILY   ONETOUCH ULTRA test strip USE TO TEST BLOOD SUGAR DAILY.   prednisoLONE acetate (PRED FORTE) 1 % ophthalmic suspension 1 drop 4 (four) times daily.   SYMBICORT 160-4.5 MCG/ACT inhaler Inhale 2 puffs into the lungs 2 (two) times daily.   valACYclovir (VALTREX) 1000 MG tablet 2 tablets twice a day for 1 day as needed for cold sores   No facility-administered medications prior to visit.    Review of Systems  Constitutional:  Negative for appetite change, chills, fatigue and fever.  Respiratory:  Negative for chest tightness and shortness of breath.   Cardiovascular:  Negative for chest pain and palpitations.  Gastrointestinal:  Negative for abdominal pain, nausea and vomiting.  Neurological:  Negative for dizziness and weakness.      Objective    BP (!) 125/53 (BP Location: Left Arm, Patient Position: Sitting, Cuff Size: Normal)    Pulse 84    Temp 98.2 F (36.8 C) (Oral)    Resp 18    Wt 166 lb (75.3 kg)    SpO2 99% Comment: room air   BMI 27.62 kg/m  {Show  previous vital signs (optional):23777}  Physical Exam  Normal appearing tongue. No nodules or lesions appreciated with palpations.     Assessment & Plan     1. 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 Will recheck at her next follow up.    Future Appointments  Date Time Provider [place]  08/21/2021 12:30 PM Hayden Pedro, MD [facility] None  09/19/2021  9:30 AM [facility] VASC 3 [facility] None  09/19/2021 10:45 AM Dew, Erskine Squibb, MD [facility] None  10/07/2021 11:00 AM Birdie Sons, MD [facility]  01/12/2022  9:15 AM Hayden Pedro, MD T[facility]TRE None        The entirety of the information  documented in the History of Present Illness, Review of Systems and Physical Exam were personally obtained by me. Portions of this information were initially documented by the CMA and reviewed by me for thoroughness and accuracy.     Lelon Huh, MD  [facility] [phone] (phone) [phone] (fax)  [place]

## 2021-06-25 DIAGNOSIS — C4361 Malignant melanoma of right upper limb, including shoulder: Secondary | ICD-10-CM | POA: Diagnosis not present

## 2021-07-04 ENCOUNTER — Other Ambulatory Visit: Payer: Self-pay | Admitting: Family Medicine

## 2021-07-04 DIAGNOSIS — J432 Centrilobular emphysema: Secondary | ICD-10-CM

## 2021-07-07 ENCOUNTER — Other Ambulatory Visit: Payer: Self-pay | Admitting: Family Medicine

## 2021-07-07 NOTE — Telephone Encounter (Signed)
[facility] faxed refill request for the following medications:  amitriptyline (ELAVIL) 25 MG tablet   Please advise.

## 2021-07-29 ENCOUNTER — Telehealth: Payer: Self-pay | Admitting: Family Medicine

## 2021-07-29 NOTE — Telephone Encounter (Signed)
Please check with pt to see if she started back on amitriptyline, she reported at her last visit that she had stopped taking it.  ?

## 2021-07-29 NOTE — Telephone Encounter (Signed)
[facility] faxed refill request for the following medications: ? ?amitriptyline (ELAVIL) 25 MG tablet  ? ?Please advise. ? ?

## 2021-08-01 DIAGNOSIS — C7989 Secondary malignant neoplasm of other specified sites: Secondary | ICD-10-CM | POA: Diagnosis not present

## 2021-08-01 DIAGNOSIS — C4362 Malignant melanoma of left upper limb, including shoulder: Secondary | ICD-10-CM | POA: Diagnosis not present

## 2021-08-01 DIAGNOSIS — C439 Malignant melanoma of skin, unspecified: Secondary | ICD-10-CM | POA: Diagnosis not present

## 2021-08-01 DIAGNOSIS — Z85820 Personal history of malignant melanoma of skin: Secondary | ICD-10-CM | POA: Diagnosis not present

## 2021-08-06 ENCOUNTER — Other Ambulatory Visit: Payer: Self-pay | Admitting: Family Medicine

## 2021-08-06 DIAGNOSIS — I10 Essential (primary) hypertension: Secondary | ICD-10-CM

## 2021-08-09 ENCOUNTER — Other Ambulatory Visit: Payer: Self-pay | Admitting: Family Medicine

## 2021-08-09 DIAGNOSIS — J432 Centrilobular emphysema: Secondary | ICD-10-CM

## 2021-08-18 ENCOUNTER — Ambulatory Visit: Payer: Self-pay | Admitting: *Deleted

## 2021-08-18 MED ORDER — FUROSEMIDE 20 MG PO TABS
20.0000 mg | ORAL_TABLET | Freq: Every day | ORAL | 4 refills | Status: DC | PRN
Start: 2021-08-18 — End: 2023-04-09

## 2021-08-18 NOTE — Telephone Encounter (Signed)
Patient states she is out of her Furosamide- she has been out for 2 weeks. Patient thought she may be able to go without- but she is having significant swelling of feet and legs during the day. Request RF. ?

## 2021-08-18 NOTE — Telephone Encounter (Signed)
Summary: swelling in feet and legs several days/Medication Request.  ? Pt is requesting fluid pills medication stated experiencing swelling in feet and legs several days.    ?Pt declined appointment would just like medication.  ? ?Seeking clinical advice   ?  ? ?Reason for Disposition ? [1] Prescription refill request for ESSENTIAL medicine (i.e., likelihood of harm to patient if not taken) AND [2] triager unable to refill per department policy ? ?Answer Assessment - Initial Assessment Questions ?1. ONSET: "When did the swelling start?" (e.g., minutes, hours, days) ?    Started at least 2 weeks ?2. LOCATION: "What part of the leg is swollen?"  "Are both legs swollen or just one leg?" ?    Bilateral feet/leg swelling- into calve ?3. SEVERITY: "How bad is the swelling?" (e.g., localized; mild, moderate, severe) ? - Localized - small area of swelling localized to one leg ? - MILD pedal edema - swelling limited to foot and ankle, pitting edema < 1/4 inch (6 mm) deep, rest and elevation eliminate most or all swelling ? - MODERATE edema - swelling of lower leg to knee, pitting edema > 1/4 inch (6 mm) deep, rest and elevation only partially reduce swelling ? - SEVERE edema - swelling extends above knee, facial or hand swelling present  ?    moderate ?4. REDNESS: "Does the swelling look red or infected?" ?    Redness around toes- always there ?5. PAIN: "Is the swelling painful to touch?" If Yes, ask: "How painful is it?"   (Scale 1-10; mild, moderate or severe) ?    With swelling- go down a lot at night ?6. FEVER: "Do you have a fever?" If Yes, ask: "What is it, how was it measured, and when did it start?"  ?    no ?7. CAUSE: "What do you think is causing the leg swelling?" ?    Fluid collection ?8. MEDICAL HISTORY: "Do you have a history of heart failure, kidney disease, liver failure, or cancer?" ?    Cancer-skin ?9. RECURRENT SYMPTOM: "Have you had leg swelling before?" If Yes, ask: "When was the last time?" "What  happened that time?" ?    Yes- patient ran out of her fluid medication- patient had been out for 2 weeks or longer ?10. OTHER SYMPTOMS: "Do you have any other symptoms?" (e.g., chest pain, difficulty breathing) ?      No- patient has COPD- no changes ?11. PREGNANCY: "Is there any chance you are pregnant?" "When was your last menstrual period?" ?      *No Answer* ? ?Answer Assessment - Initial Assessment Questions ?1. DRUG NAME: "What medicine do you need to have refilled?" ?    Furosamide 20 mg ?2. REFILLS REMAINING: "How many refills are remaining?" (Note: The label on the medicine or pill bottle will show how many refills are remaining. If there are no refills remaining, then a renewal may be needed.) ?    none ?3. EXPIRATION DATE: "What is the expiration date?" (Note: The label states when the prescription will expire, and thus can no longer be refilled.) ?    na ?4. PRESCRIBING HCP: "Who prescribed it?" Reason: If prescribed by specialist, call should be referred to that group. ?    PCP ?5. SYMPTOMS: "Do you have any symptoms?" ?    Bilateral swelling in feet and legs ?6. PREGNANCY: "Is there any chance that you are pregnant?" "When was your last menstrual period?" ? ?Protocols used: Leg Swelling and Edema-A-AH, Medication Refill and  Renewal Call-A-AH ? ?

## 2021-08-18 NOTE — Addendum Note (Signed)
Addended by: Birdie Sons on: 08/18/2021 05:11 PM ? ? Modules accepted: Orders ? ?

## 2021-08-19 ENCOUNTER — Other Ambulatory Visit: Payer: Self-pay | Admitting: Family Medicine

## 2021-08-19 DIAGNOSIS — J432 Centrilobular emphysema: Secondary | ICD-10-CM

## 2021-08-19 DIAGNOSIS — K219 Gastro-esophageal reflux disease without esophagitis: Secondary | ICD-10-CM

## 2021-08-19 DIAGNOSIS — I6521 Occlusion and stenosis of right carotid artery: Secondary | ICD-10-CM

## 2021-08-21 ENCOUNTER — Encounter (INDEPENDENT_AMBULATORY_CARE_PROVIDER_SITE_OTHER): Payer: Medicare Other | Admitting: Ophthalmology

## 2021-08-21 DIAGNOSIS — H43813 Vitreous degeneration, bilateral: Secondary | ICD-10-CM

## 2021-08-21 DIAGNOSIS — H35033 Hypertensive retinopathy, bilateral: Secondary | ICD-10-CM | POA: Diagnosis not present

## 2021-08-21 DIAGNOSIS — H35073 Retinal telangiectasis, bilateral: Secondary | ICD-10-CM | POA: Diagnosis not present

## 2021-08-21 DIAGNOSIS — I10 Essential (primary) hypertension: Secondary | ICD-10-CM

## 2021-08-25 ENCOUNTER — Encounter: Payer: Self-pay | Admitting: Family Medicine

## 2021-08-25 ENCOUNTER — Ambulatory Visit (INDEPENDENT_AMBULATORY_CARE_PROVIDER_SITE_OTHER): Payer: Medicare Other | Admitting: Family Medicine

## 2021-08-25 ENCOUNTER — Ambulatory Visit: Payer: Self-pay | Admitting: *Deleted

## 2021-08-25 DIAGNOSIS — J441 Chronic obstructive pulmonary disease with (acute) exacerbation: Secondary | ICD-10-CM | POA: Diagnosis not present

## 2021-08-25 DIAGNOSIS — R059 Cough, unspecified: Secondary | ICD-10-CM | POA: Diagnosis not present

## 2021-08-25 MED ORDER — PREDNISONE 10 MG PO TABS: ORAL_TABLET | ORAL | 0 refills | Status: AC

## 2021-08-25 MED ORDER — AZITHROMYCIN 250 MG PO TABS: ORAL_TABLET | ORAL | 0 refills | Status: AC

## 2021-08-25 NOTE — Progress Notes (Signed)
?  ? ?I,Jana Robinson,acting as a scribe for Lelon Huh, MD.,have documented all relevant documentation on the behalf of Lelon Huh, MD,as directed by  Lelon Huh, MD while in the presence of Lelon Huh, MD. ? ? ?Established patient visit ? ? ?Patient: Erica Rivas   DOB: [birth year]   81 y.o. Female  MRN: [id number] ?Visit Date: 08/25/2021 ? ?Today's healthcare provider: Lelon Huh, MD  ? ?Chief Complaint  ?Patient presents with  ? Shortness of Breath  ? Cough  ? ?Subjective  ?  ? Shortness of Breath ?This is a new problem. The current episode started in the past 7 days. The problem occurs constantly. The problem has been unchanged. The average episode lasts 4 days (since Thursday afternoon). Associated symptoms include wheezing. Pertinent negatives include no abdominal pain, chest pain, claudication, coryza, ear pain, fever, headaches, hemoptysis, leg pain, leg swelling, neck pain, orthopnea, PND, rash, rhinorrhea, sore throat, sputum production, swollen glands, syncope or vomiting. Nothing aggravates the symptoms. The patient has no known risk factors for DVT/PE. She has tried nothing for the symptoms. Her past medical history is significant for COPD. There is no history of allergies, aspirin allergies, asthma, bronchiolitis, CAD, chronic lung disease, DVT, a heart failure, PE, pneumonia or a recent surgery.  ?Cough ?This is a new problem. The current episode started in the past 7 days. The problem has been unchanged. The problem occurs every few minutes. The cough is Productive of sputum (clear). Associated symptoms include shortness of breath and wheezing. Pertinent negatives include no chest pain, ear pain, fever, headaches, hemoptysis, rash, rhinorrhea or sore throat. Nothing aggravates the symptoms. She has tried nothing for the symptoms. Her past medical history is significant for COPD. There is no history of asthma or pneumonia.  ?Also to recheck melanoma of right forearm.   ?Medications: ?Outpatient Medications Prior to Visit  ?Medication Sig  ? albuterol (VENTOLIN HFA) 108 (90 Base) MCG/ACT inhaler INHALE 2 PUFFS BY MOUTH EVERY 6 HOURS AS NEEDED FOR WHEEZING AND FOR SHORTNESS OF BREATH  ? amLODipine (NORVASC) 5 MG tablet TAKE 1 TABLET BY MOUTH  DAILY  ? aspirin EC 81 MG tablet Take 81 mg by mouth daily.  ? atenolol (TENORMIN) 50 MG tablet TAKE 1 TABLET BY MOUTH  TWICE DAILY  ? atorvastatin (LIPITOR) 80 MG tablet TAKE 1 TABLET BY MOUTH  DAILY  ? Budeson-Glycopyrrol-Formoterol (BREZTRI AEROSPHERE) 160-9-4.8 MCG/ACT AERO Inhale 2 puffs into the lungs 2 (two) times daily. TAKE IN THE PLACE OF SYMBICORT  ? Cholecalciferol (VITAMIN D) 2000 UNITS tablet Take 1,000 Units by mouth daily.   ? clopidogrel (PLAVIX) 75 MG tablet Take 1 tablet (75 mg total) by mouth daily.  ? furosemide (LASIX) 20 MG tablet Take 1 tablet (20 mg total) by mouth daily as needed (swelling).  ? glipiZIDE (GLUCOTROL XL) 10 MG 24 hr tablet TAKE 1 TABLET BY MOUTH  DAILY  ? glucose blood (ONETOUCH ULTRA) test strip USE 1 STRIP TO CHECK GLUCOSE ONCE DAILY  ? ketorolac (ACULAR) 0.5 % ophthalmic solution 1 drop 4 (four) times daily.  ? lisinopril-hydrochlorothiazide (ZESTORETIC) 20-25 MG tablet TAKE 1 TABLET BY MOUTH  DAILY  ? metFORMIN (GLUCOPHAGE) 1000 MG tablet TAKE 1 TABLET BY MOUTH  TWICE DAILY  ? montelukast (SINGULAIR) 10 MG tablet TAKE 1 TABLET BY MOUTH  DAILY  ? omeprazole (PRILOSEC) 40 MG capsule TAKE 1 CAPSULE BY MOUTH  DAILY  ? OneTouch Delica Lancets 57D MISC USE TO CHECK BLOOD SUGAR DAILY  ? prednisoLONE acetate (  PRED FORTE) 1 % ophthalmic suspension 1 drop 4 (four) times daily.  ? SYMBICORT 160-4.5 MCG/ACT inhaler USE 2 INHALATIONS BY MOUTH  TWICE DAILY  ? valACYclovir (VALTREX) 1000 MG tablet 2 tablets twice a day for 1 day as needed for cold sores  ? ?No facility-administered medications prior to visit.  ? ? ?Review of Systems  ?Constitutional:  Negative for fever.  ?HENT:  Negative for ear pain, rhinorrhea  and sore throat.   ?Respiratory:  Positive for cough, shortness of breath and wheezing. Negative for hemoptysis and sputum production.   ?Cardiovascular:  Negative for chest pain, orthopnea, claudication, leg swelling, syncope and PND.  ?Gastrointestinal:  Negative for abdominal pain and vomiting.  ?Musculoskeletal:  Negative for neck pain.  ?Skin:  Negative for rash.  ?Neurological:  Negative for headaches.  ? ? ?  Objective  ?  ?BP 115/63 (BP Location: Right Arm, Patient Position: Sitting, Cuff Size: Normal)   Pulse 71   Temp 97.7 ?F (36.5 ?C) (Oral)   Resp 16   Wt 156 lb 12.8 oz (71.1 kg)   SpO2 99%   BMI 26.09 kg/m?  ? ? ?Physical Exam  ? ?General: Appearance:     ?Well developed, well nourished female in no acute distress  ?Eyes:    PERRL, conjunctiva/corneas clear, EOM's intact       ?Lungs:     Occasional expiratory wheeze, no rhonchi, respirations unlabored  ?Heart:    Normal heart rate. Normal rhythm. No murmurs, rubs, or gallops.    ?MS:   All extremities are intact.    ?Neurologic:   Awake, alert, oriented x 3. No apparent focal neurological defect.   ?   ?  ? Assessment & Plan  ?  ? ?1. Cough ? ?- predniSONE (DELTASONE) 10 MG tablet; 6 tablets for 1 day, then 5 for 1 day, then 4 for 1 day, then 3 for 1 day, then 2 for 1 day then 1 for 1 day.  Dispense: 21 tablet; Refill: 0 ? ?2. COPD exacerbation ([place]) ? ?- predniSONE (DELTASONE) 10 MG tablet; 6 tablets for 1 day, then 5 for 1 day, then 4 for 1 day, then 3 for 1 day, then 2 for 1 day then 1 for 1 day.  Dispense: 21 tablet; Refill: 0 ?- azithromycin (ZITHROMAX) 250 MG tablet; Take 2 tablets on day 1, then 1 tablet daily on days 2 through 5  Dispense: 6 tablet; Refill: 0  ?Call if symptoms change or if not rapidly improving.  ?  ?   ? ?The entirety of the information documented in the History of Present Illness, Review of Systems and Physical Exam were personally obtained by me. Portions of this information were initially documented by the CMA and  reviewed by me for thoroughness and accuracy.   ? ? ?Lelon Huh, MD  ?[facility] ?[phone] (phone) ?[phone] (fax) ? ?[facility] ?

## 2021-08-25 NOTE — Telephone Encounter (Signed)
? ?  Chief Complaint: SOB ?Symptoms: productive cough ?Frequency: constant sob ?Pertinent Negatives: Patient denies fever ?Disposition: [] ED /[] Urgent Care (no appt availability in office) / [x] Appointment(In office/virtual)/ []  [place] [facility]/ [] Home Care/ [] Refused Recommended Disposition /[] [place] [facility]/ []  Follow-up with PCP ?Additional Notes: Pt states had a test last week where they injected dye and she has been sob since. Appt made for today with Dr. Caryn Section. ? ?Reason for Disposition ? [1] MILD difficulty breathing (e.g., minimal/no SOB at rest, SOB with walking, pulse <100) AND [2] NEW-onset or WORSE than normal ? ?Answer Assessment - Initial Assessment Questions ?1. RESPIRATORY STATUS: "Describe your breathing?" (e.g., wheezing, shortness of breath, unable to speak, severe coughing)  ?    SOB, a little wheezing, coughing up phlegm,clear ?2. ONSET: "When did this breathing problem begin?"  ?    Had a test with dye Thursday and that is when it started ?3. PATTERN "Does the difficult breathing come and go, or has it been constant since it started?"  ?    All the time ?4. SEVERITY: "How bad is your breathing?" (e.g., mild, moderate, severe)  ?  - MILD: No SOB at rest, mild SOB with walking, speaks normally in sentences, can lie down, no retractions, pulse < 100.  ?  - MODERATE: SOB at rest, SOB with minimal exertion and prefers to sit, cannot lie down flat, speaks in phrases, mild retractions, audible wheezing, pulse 100-120.  ?  - SEVERE: Very SOB at rest, speaks in single words, struggling to breathe, sitting hunched forward, retractions, pulse > 120  ?    8 ?5. RECURRENT SYMPTOM: "Have you had difficulty breathing before?" If Yes, ask: "When was the last time?" and "What happened that time?"  ?    First time like this ?6. CARDIAC HISTORY: "Do you have any history of heart disease?" (e.g., heart attack, angina, bypass surgery, angioplasty)  ?     ?7. LUNG HISTORY: "Do you have any history  of lung disease?"  (e.g., pulmonary embolus, asthma, emphysema) ?    COPD ?8. CAUSE: "What do you think is causing the breathing problem?"  ?    That dye ?9. OTHER SYMPTOMS: "Do you have any other symptoms? (e.g., dizziness, runny nose, cough, chest pain, fever) ?    no ?10. O2 SATURATION MONITOR:  "Do you use an oxygen saturation monitor (pulse oximeter) at home?" If Yes, "What is your reading (oxygen level) today?" "What is your usual oxygen saturation reading?" (e.g., 95%) ?      Can not figure out to work it. ?11. PREGNANCY: "Is there any chance you are pregnant?" "When was your last menstrual period?" ?      na ?12. TRAVEL: "Have you traveled out of the country in the last month?" (e.g., travel history, exposures) ?      na ? ?Protocols used: Breathing Difficulty-A-AH ? ?

## 2021-08-26 ENCOUNTER — Encounter: Payer: Self-pay | Admitting: Pulmonary Disease

## 2021-08-26 ENCOUNTER — Ambulatory Visit: Payer: Medicare Other | Admitting: Pulmonary Disease

## 2021-08-26 ENCOUNTER — Telehealth: Payer: Self-pay | Admitting: *Deleted

## 2021-08-26 VITALS — BP 104/50 | HR 68 | Temp 97.5°F | Ht 66.0 in | Wt 157.4 lb

## 2021-08-26 DIAGNOSIS — J449 Chronic obstructive pulmonary disease, unspecified: Secondary | ICD-10-CM

## 2021-08-26 DIAGNOSIS — R0689 Other abnormalities of breathing: Secondary | ICD-10-CM

## 2021-08-26 DIAGNOSIS — J441 Chronic obstructive pulmonary disease with (acute) exacerbation: Secondary | ICD-10-CM

## 2021-08-26 DIAGNOSIS — Z87891 Personal history of nicotine dependence: Secondary | ICD-10-CM | POA: Diagnosis not present

## 2021-08-26 DIAGNOSIS — R0600 Dyspnea, unspecified: Secondary | ICD-10-CM

## 2021-08-26 MED ORDER — BREZTRI AEROSPHERE 160-9-4.8 MCG/ACT IN AERO
2.0000 | INHALATION_SPRAY | Freq: Two times a day (BID) | RESPIRATORY_TRACT | 0 refills | Status: DC
Start: 2021-08-26 — End: 2022-01-21

## 2021-08-26 MED ORDER — ALBUTEROL SULFATE (2.5 MG/3ML) 0.083% IN NEBU: 2.5000 mg | INHALATION_SOLUTION | Freq: Four times a day (QID) | RESPIRATORY_TRACT | 3 refills | Status: DC | PRN

## 2021-08-26 NOTE — Telephone Encounter (Signed)
Patient given patient assistance paperwork for [facility] while she was in the office.  She will bring her portion back at her f/u with the NP.  The provider portion was given to Dr. Patsey Berthold for her signature. ?

## 2021-08-26 NOTE — Patient Instructions (Signed)
Continue using the Breztri 2 puffs twice a day.  Make sure you rinse your mouth well after you use it. ? ?We have sent in a prescription to a DME company to provide you with a nebulizer machine for home use. ? ?You will get medication for the nebulizer machine that you can use up to 4 times a day ONLY AS NEEDED. ? ?Finish your prednisone and antibiotic as prescribed by Dr. Caryn Section. ? ?We will get some breathing tests and a heart test to evaluate your breathing function and your heart function. ? ?I asked Dr. Caryn Section for a referral to a local dermatologist. ? ?We will have you see one of our nurse practitioners within 3 to 4 weeks.  I will see you in follow-up in 3 months time. ? ? ?

## 2021-08-26 NOTE — Progress Notes (Signed)
? ?Subjective:  ? ? Patient ID: Erica Rivas, female    DOB: 27-Jul-1940, 81 y.o.   MRN: [id number] ?Patient Care Team: ?Birdie Sons, MD as PCP - General (Family Medicine) ?Dasher, Rayvon Char, MD as Consulting Physician (Dermatology) ?Agapito Games as Consulting Physician (Optometry) ?Hayden Pedro, MD as Consulting Physician (Ophthalmology) ?Rickard Patience, MD as Referring Physician (Surgery) ? ?Chief Complaint  ?Patient presents with  ? Consult  ?  COPD.  Sob with exertion (bathing and dressing), panics at time.  Coughing up thick clear mucous.    Sometimes the mucous is easy to get up and sometime difficult to get up.  ? ? ?HPI ?Erica Rivas is a 81 year old smoker (150 PY), abstinent of tobacco since 6 April, who presents for evaluation of shortness of breath with episodes of "panic" with shortness of breath.  She is kindly referred by Dr. Lelon Huh.  She has had dyspnea for several years however over the last week she has noted increased difficulties with dyspnea particularly with activities of daily living such as bathing and dressing and has noted increased cough productive of thick mucus.  When her symptoms started a week ago she also noticed that she had lower extremity edema and was prescribed a diuretic by Dr. Caryn Section, this actually helped her with her breathing some.  However she has continued to have difficulties with dyspnea.  She was also given samples of Breztri by Dr. Caryn Section and she has noted that these helped some.  She does not have a nebulizer machine at home and does not use rescue nebulization treatments.  She does note some orthopnea, has had also paroxysmal nocturnal dyspnea.  She notes that with the shortness of breath she sometimes "panics" and this makes her breathing worse.  Her sputum has become more tenacious.  She has not had any fevers, chills or sweats.  She is currently on a prednisone taper and Azithromycin for COPD exacerbation.  Any activity exacerbates her  shortness of breath, rest helps her.  She does not endorse any other symptomatology. ? ?She is enrolled in lung cancer screening, last study was 21 August 2020, she had a left lower lobe pulmonary nodule classified as lung RADS 4AS at that time because of a history of melanoma she had SBRT to this nodule at [facility].  She had recent PET/CT at [facility] (16 June 2021) that showed decrease in the size of that nodule without FDG uptake and no other FDG uptake was noted. ? ? ?Review of Systems ?A 10 point review of systems was performed and it is as noted above otherwise negative. ? ?Past Medical History:  ?Diagnosis Date  ? Actinic keratosis   ? Anemia   ? Colon polyps   ? COPD (chronic obstructive pulmonary disease) ([place])   ? Depressive disorder 07/08/2007  ? Diabetes mellitus without complication ([place])   ? Hemorrhoids   ? History of chicken pox   ? History of measles   ? History of mumps   ? Lung nodule 02/18/2015  ? No additional follow up needed per CT report 03/05/2015   ? Melanoma ([place]) 08/14/2020  ? right forearm, excised 09/17/2020 [facility]  ? 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 L pretibial - ED&C   ? Squamous cell carcinoma of skin 05/23/2019  ? L calf - ED&C   ? ?Past Surgical History:  ?Procedure Laterality Date  ? ABDOMINAL HYSTERECTOMY  1985  ? DUB; ovaries resected/ removed  ? BREAST BIOPSY Right 11/12/2014  ?  fibroadenomatous  ? CARDIOVASCULAR STRESS TEST  02/2008  ? low risk  ? CAROTID PTA/STENT INTERVENTION Right 11/13/2019  ? Procedure: CAROTID PTA/STENT INTERVENTION;  Surgeon: Algernon Huxley, MD;  Location: [facility];  Service: Cardiovascular;  Laterality: Right;  ? COLONOSCOPY WITH PROPOFOL N/A 01/18/2018  ? Procedure: COLONOSCOPY WITH PROPOFOL;  Surgeon: Lin Landsman, MD;  Location: [facility] ENDOSCOPY;  Service: Gastroenterology;  Laterality: N/A;  ? DOPPLER ECHOCARDIOGRAPHY  01/2010  ? EF>55%, LV relaxation impairment consistent with diastolic dysfunction, mild TR and MR  ?  ESOPHAGOGASTRODUODENOSCOPY  07/2009  ? +diffuse gastritis. [place]  ? ESOPHAGOGASTRODUODENOSCOPY (EGD) WITH PROPOFOL N/A 01/18/2018  ? Procedure: ESOPHAGOGASTRODUODENOSCOPY (EGD) WITH PROPOFOL;  Surgeon: Lin Landsman, MD;  Location: [facility] ENDOSCOPY;  Service: Gastroenterology;  Laterality: N/A;  ? [place]  ? HAND SURGERY Right 2010  ? surgery x 2. Dr. Sabra Heck  ? MELANOMA EXCISION Right 09/17/2020  ? Wide excision r forearm Dr. Arrie Aran, [facility]  ? NECK SURGERY  1998  ? Disc surgey on neck  ? Sinus Surgery    ? TUBAL LIGATION  1971  ? ?Patient Active Problem List  ? Diagnosis Date Noted  ? Fatigue 01/31/2021  ? Tobacco abuse 01/31/2021  ? Tobacco abuse counseling 01/31/2021  ? Cough 01/31/2021  ? Palpitation 01/31/2021  ? 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 Malignant melanoma of right forearm ([place]) 09/11/2020  ? Carotid stenosis, right 11/13/2019  ? Carotid stenosis 10/05/2019  ? 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 Moderate episode of recurrent major depressive disorder ([place]) 07/05/2018  ? Duodenitis 01/21/2018  ? Iron deficiency anemia 11/25/2017  ? Atherosclerosis of aorta ([place]) 07/23/2016  ? Coronary atherosclerosis 07/23/2016  ? 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 Allergic rhinitis 02/18/2015  ? Anxiety 02/18/2015  ? Aortic root dilation ([place]) 02/18/2015  ? Chronic kidney disease (CKD), stage III (moderate) ([place]) 02/18/2015  ? Edema 02/18/2015  ? Insomnia 02/18/2015  ? Lung nodule 02/18/2015  ? Mass of pancreas 02/18/2015  ? Osteopenia 02/18/2015  ? Elevated WBC count 02/18/2015  ? Weight loss 02/18/2015  ? Diastolic dysfunction 47/82/9562  ? Vitamin B12 deficiency anemia 11/04/2014  ? Vitamin D deficiency 10/27/2013  ? Internal hemorrhoids without complication 13/12/6576  ? Smoking greater than 30 pack years 07/08/2007  ? 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 Disc disorder of lumbar region 07/08/2007   ? Esophageal reflux 07/08/2007  ? Family history of breast cancer 07/08/2007  ? History of adenomatous polyp of colon 07/08/2007  ? History of other malignant neoplasm of skin 07/08/2007  ? Essential hypertension 07/08/2007  ? Pure hypercholesterolemia 07/08/2007  ? ?Family History  ?Problem Relation Age of Onset  ? Hypertension Sister   ? COPD Sister   ? Thyroid disease Sister   ? Breast cancer Sister 90  ? Thyroid disease Sister   ? Heart attack Mother   ? Hypertension Mother   ? Thyroid disease Mother   ?     HYPERTHYROIDISM  ? GI Bleed Father   ? Lung cancer Brother   ? Breast cancer Sister   ? Hypertension Sister   ? Aneurysm Sister   ?     in the back of her neck  ? Heart attack Brother   ?     CABG  ? ?Social History  ? ?Tobacco Use  ? Smoking status: Former  ?  Packs/day: 3.00  ?  Years: 50.00  ?  Pack years: 150.00  ?  Types: Cigarettes  ?  Quit date: 08/21/2021  ?  Years since quitting: 0.0  ? Smokeless tobacco: Never  ? Tobacco comments:  ?  Had been down to a ppd.  Has not had a cigarette since 08/21/21.  ?Substance Use Topics  ? Alcohol use: No  ?  Alcohol/week: 0.0 standard drinks  ? ?Allergies  ?Allergen Reactions  ? Bupropion Other (See Comments)  ?  fatigue  ? ?Current Meds  ?Medication Sig  ? albuterol (VENTOLIN HFA) 108 (90 Base) MCG/ACT inhaler INHALE 2 PUFFS BY MOUTH EVERY 6 HOURS AS NEEDED FOR WHEEZING AND FOR SHORTNESS OF BREATH  ? amLODipine (NORVASC) 5 MG tablet TAKE 1 TABLET BY MOUTH  DAILY  ? aspirin EC 81 MG tablet Take 81 mg by mouth daily.  ? atenolol (TENORMIN) 50 MG tablet TAKE 1 TABLET BY MOUTH  TWICE DAILY  ? atorvastatin (LIPITOR) 80 MG tablet TAKE 1 TABLET BY MOUTH  DAILY  ? azithromycin (ZITHROMAX) 250 MG tablet Take 2 tablets on day 1, then 1 tablet daily on days 2 through 5  ? Budeson-Glycopyrrol-Formoterol (BREZTRI AEROSPHERE) 160-9-4.8 MCG/ACT AERO Inhale 2 puffs into the lungs 2 (two) times daily. TAKE IN THE PLACE OF SYMBICORT  ? Cholecalciferol (VITAMIN D) 2000 UNITS tablet  Take 1,000 Units by mouth daily.   ? furosemide (LASIX) 20 MG tablet Take 1 tablet (20 mg total) by mouth daily as needed (swelling).  ? glipiZIDE (GLUCOTROL XL) 10 MG 24 hr tablet TAKE 1 TABLET BY MOUTH  DAILY  ? glucose blood (O

## 2021-08-27 NOTE — Telephone Encounter (Signed)
Provider form for patient assistance faxed to [employer].  Will await return of patient form/documents. ?

## 2021-08-29 ENCOUNTER — Encounter: Payer: Self-pay | Admitting: Pulmonary Disease

## 2021-08-29 DIAGNOSIS — J441 Chronic obstructive pulmonary disease with (acute) exacerbation: Secondary | ICD-10-CM | POA: Diagnosis not present

## 2021-08-30 ENCOUNTER — Other Ambulatory Visit: Payer: Self-pay | Admitting: Pulmonary Disease

## 2021-08-30 MED ORDER — ALBUTEROL SULFATE (2.5 MG/3ML) 0.083% IN NEBU: 2.5000 mg | INHALATION_SOLUTION | Freq: Four times a day (QID) | RESPIRATORY_TRACT | 3 refills | Status: DC | PRN

## 2021-08-30 NOTE — Progress Notes (Signed)
Patient called that she had not received her albuterol nebulization medication prescribed on 11 April.  This was sent to [employer] [facility].  Suspect that this is being mailed to the patient.  We will send a prescription to [facility] in [place] per her request.  Patient was not in any distress and did not have any other questions or concerns. ? ?C. Derrill Kay, MD ?Advanced Bronchoscopy ?[facility] ? ?*This note was dictated using voice recognition software/Dragon.  Despite best efforts to proofread, errors can occur which can change the meaning. Any transcriptional errors that result from this process are unintentional and may not be fully corrected at the time of dictation. ? ? ?

## 2021-09-01 NOTE — Telephone Encounter (Signed)
Spoke to patient via telephone.  ?She wanted to know if she needed to continue her inhalers while using albuterol solution. She is aware that albuterol is to be used as needed and Erica Rivas is BID.  ?She stated that she has received albuterol solution Rx.  ?She voiced her understanding and had no further questions.  ?Nothing further needed. ?

## 2021-09-02 DIAGNOSIS — I5189 Other ill-defined heart diseases: Secondary | ICD-10-CM | POA: Diagnosis not present

## 2021-09-02 DIAGNOSIS — E7800 Pure hypercholesterolemia, unspecified: Secondary | ICD-10-CM | POA: Diagnosis not present

## 2021-09-02 DIAGNOSIS — K219 Gastro-esophageal reflux disease without esophagitis: Secondary | ICD-10-CM | POA: Diagnosis not present

## 2021-09-02 DIAGNOSIS — I10 Essential (primary) hypertension: Secondary | ICD-10-CM | POA: Diagnosis not present

## 2021-09-02 DIAGNOSIS — I77810 Thoracic aortic ectasia: Secondary | ICD-10-CM | POA: Diagnosis not present

## 2021-09-02 DIAGNOSIS — N1831 Chronic kidney disease, stage 3a: Secondary | ICD-10-CM | POA: Diagnosis not present

## 2021-09-02 DIAGNOSIS — I2510 Atherosclerotic heart disease of native coronary artery without angina pectoris: Secondary | ICD-10-CM | POA: Diagnosis not present

## 2021-09-02 DIAGNOSIS — J449 Chronic obstructive pulmonary disease, unspecified: Secondary | ICD-10-CM | POA: Diagnosis not present

## 2021-09-02 DIAGNOSIS — E1142 Type 2 diabetes mellitus with diabetic polyneuropathy: Secondary | ICD-10-CM | POA: Diagnosis not present

## 2021-09-02 DIAGNOSIS — Z720 Tobacco use: Secondary | ICD-10-CM | POA: Diagnosis not present

## 2021-09-04 ENCOUNTER — Encounter: Payer: Self-pay | Admitting: Pulmonary Disease

## 2021-09-05 DIAGNOSIS — E119 Type 2 diabetes mellitus without complications: Secondary | ICD-10-CM | POA: Diagnosis not present

## 2021-09-05 DIAGNOSIS — I10 Essential (primary) hypertension: Secondary | ICD-10-CM | POA: Diagnosis not present

## 2021-09-05 DIAGNOSIS — Z7984 Long term (current) use of oral hypoglycemic drugs: Secondary | ICD-10-CM | POA: Diagnosis not present

## 2021-09-05 DIAGNOSIS — I2510 Atherosclerotic heart disease of native coronary artery without angina pectoris: Secondary | ICD-10-CM | POA: Diagnosis not present

## 2021-09-05 DIAGNOSIS — C44629 Squamous cell carcinoma of skin of left upper limb, including shoulder: Secondary | ICD-10-CM | POA: Diagnosis not present

## 2021-09-05 DIAGNOSIS — J449 Chronic obstructive pulmonary disease, unspecified: Secondary | ICD-10-CM | POA: Diagnosis not present

## 2021-09-05 DIAGNOSIS — C4362 Malignant melanoma of left upper limb, including shoulder: Secondary | ICD-10-CM | POA: Diagnosis not present

## 2021-09-05 DIAGNOSIS — E7800 Pure hypercholesterolemia, unspecified: Secondary | ICD-10-CM | POA: Diagnosis not present

## 2021-09-05 DIAGNOSIS — L858 Other specified epidermal thickening: Secondary | ICD-10-CM | POA: Diagnosis not present

## 2021-09-05 DIAGNOSIS — Z01818 Encounter for other preprocedural examination: Secondary | ICD-10-CM | POA: Diagnosis not present

## 2021-09-09 ENCOUNTER — Telehealth: Payer: Self-pay | Admitting: Family Medicine

## 2021-09-09 ENCOUNTER — Encounter: Payer: Self-pay | Admitting: Family Medicine

## 2021-09-09 ENCOUNTER — Ambulatory Visit: Payer: Self-pay

## 2021-09-09 DIAGNOSIS — Z720 Tobacco use: Secondary | ICD-10-CM

## 2021-09-09 NOTE — Telephone Encounter (Signed)
ATC patient for update. Unable to leave vm due to mailbox not being setup.  ?Will call back.  ?

## 2021-09-09 NOTE — Telephone Encounter (Signed)
?  Chief Complaint: smoking cessation ?Symptoms: quit smoking and anxious and irritable  ?Frequency: 2-3 weeks  ?Pertinent Negatives: NA ?Disposition: [] ED /[] Urgent Care (no appt availability in office) / [] Appointment(In office/virtual)/ []  [place] [facility]/ [] Home Care/ [] Refused Recommended Disposition /[] [place] [facility]/ [x]  Follow-up with PCP ?Additional Notes: pt had OV on 08/25/21, she has went 2-3 weeks now and not smoked but is requesting Dr. Caryn Section presciber her the nicotine patches because she is having a hard time and been really anxious. I asked her about preference over pills vs patches and she states that she has tried bupropion before and it didn't work for her that she just prefers the patches for now. I advised her I will send this back to him and see if we can get something sent to the pharmacy for her.  ? ?Reason for Disposition ? Requesting prescription medication to help quit smoking ? ?Answer Assessment - Initial Assessment Questions ?1. ASK:  ?   - Ask about tobacco use. ?   - "How much do you smoke?"  ?   - "How long have you been a smoker?" ?    60 years ?2. ADVISE:  ?  - Advise the patient to quit in a personal and direct manner. ?  - "Quitting tobacco is the most important thing you can do to protect your health." ?    Has had trouble breathing and getting SOB ?3. ASSESS: ?  - Assess patient's readiness to quit.  ?  - Ask every tobacco user if he/she is willing to quit at this time. ?  - "Have you tried to quit before?" If Yes, ask: "What have you tried?" "What helped?" "What led to relapse?" ?  - "Are you ready to quit now?" ?  - "On a scale from 0 to 10, with 0 being not at all motivated and 10 being extremely motivated, how motivated are you to quit smoking?" ?    Been 2-3 weeks since smoking ?4. ASSIST:  ?  - Assist the patient in quitting. ?  - Suggest that patient remove tobacco products from his/her home. ?  - Recommend that patient get support from family, friends,  and coworkers. ?  - Review past quit attempts, what helped, what led to relapse. ?  - Tell patient "You will live longer and be healthier." ?  - Reassure patient "It is never too late to quit. No matter how long you have been smoking, quitting is a healthy choice." ?     ?5. ARRANGE:  ?  - Arrange follow-up care. ?  - Arrange or recommend a follow-up visit with the primary care provider. ?  - Recommend a national helpline /quitline. ?    Pt requesting nicotine patches ? ?Protocols used: Smoking - Tobacco Use and Problems-A-AH ? ?

## 2021-09-09 NOTE — Telephone Encounter (Signed)
Pt called back - transferred to triage [name]) ? ? ?Copied from [place] [id number]. Topic: General - Inquiry ?>> Sep 09, 2021  8:35 AM [name] wrote: ?Reason for CRM: Pt was seen by Dr Caryn Section on 4/10 for cough and SOB/ Pt states had discussed with Dr Caryn Section she was quitting smoking after 40 years. Pt is determined to not go back to smoking but is very nervous and has lots of anxiety. She and husband are requesting something for her anxiety and nervousness to ease her during this time as she needs something to calm ner nervousness. Pls FU at [phone] ?

## 2021-09-09 NOTE — Telephone Encounter (Signed)
Spoke to patient. She stated that she would bring by forms on Thursday.  ?

## 2021-09-10 ENCOUNTER — Telehealth: Payer: Self-pay | Admitting: Pulmonary Disease

## 2021-09-10 MED ORDER — NICOTINE 14 MG/24HR TD PT24: 14.0000 mg | MEDICATED_PATCH | Freq: Every day | TRANSDERMAL | 0 refills | Status: DC

## 2021-09-10 NOTE — Telephone Encounter (Signed)
See triage phone message.  ?

## 2021-09-10 NOTE — Telephone Encounter (Signed)
Have sent in prescription nicotine patches ?

## 2021-09-10 NOTE — Telephone Encounter (Signed)
Please advise 

## 2021-09-10 NOTE — Addendum Note (Signed)
Addended by: Birdie Sons on: 09/10/2021 08:09 AM ? ? Modules accepted: Orders ? ?

## 2021-09-10 NOTE — Telephone Encounter (Signed)
I have spoke with Erica Rivas and explained that PA not Required for codes [place], [phone] Refer # [id number] For code [id number] insurance denied Refer #[id number] ? ?

## 2021-09-11 ENCOUNTER — Ambulatory Visit (INDEPENDENT_AMBULATORY_CARE_PROVIDER_SITE_OTHER): Payer: Medicare Other

## 2021-09-11 DIAGNOSIS — R0689 Other abnormalities of breathing: Secondary | ICD-10-CM | POA: Diagnosis not present

## 2021-09-11 DIAGNOSIS — R0600 Dyspnea, unspecified: Secondary | ICD-10-CM

## 2021-09-11 LAB — ECHOCARDIOGRAM COMPLETE
AR max vel: 1.56 cm2
AV Area VTI: 1.75 cm2
AV Area mean vel: 1.6 cm2
AV Mean grad: 9.5 mmHg
AV Peak grad: 16.8 mmHg
Ao pk vel: 2.05 m/s
Area-P 1/2: 1.91 cm2
Calc EF: 54.7 %
S' Lateral: 2.9 cm
Single Plane A2C EF: 53 %
Single Plane A4C EF: 56.5 %

## 2021-09-11 MED ORDER — BREZTRI AEROSPHERE 160-9-4.8 MCG/ACT IN AERO: 2.0000 | INHALATION_SPRAY | Freq: Two times a day (BID) | RESPIRATORY_TRACT | 0 refills | Status: DC

## 2021-09-11 NOTE — Telephone Encounter (Signed)
One sample of Breztri given to patient as requested by patient.  ?

## 2021-09-11 NOTE — Telephone Encounter (Signed)
Received documentation from patient, however patient did not complete patient portion of application.  ?Application left up front for pickup. Patient stated that she would come back by today to complete application.  ?

## 2021-09-12 ENCOUNTER — Encounter: Payer: Self-pay | Admitting: Family Medicine

## 2021-09-12 NOTE — Telephone Encounter (Signed)
Received fax confirmation. ?Spoke to patient and made her aware. She requested that I shred income documents. ?Income documents placed in shred bin.  ?Will await determination.  ?

## 2021-09-12 NOTE — Telephone Encounter (Signed)
Patient brought by completed application.  ?Application and income documentation faxed to [employer].  ?

## 2021-09-15 ENCOUNTER — Encounter: Payer: Self-pay | Admitting: Family Medicine

## 2021-09-15 ENCOUNTER — Telehealth: Payer: Self-pay | Admitting: Family Medicine

## 2021-09-15 NOTE — Telephone Encounter (Signed)
Patient is not smoking any cigarettes now- she is requesting something for her nerves(patient got to where she could not breath good- was seen 4/10 with PCP). Patient could not afford the patches. Patient states PCP had offered in the past to call something in to help her relax. Patient is requesting this particular medication because she had taken before radiation. Patient states she had some left and she took at night for 3 nights - patient reports she slept well.  ?

## 2021-09-15 NOTE — Telephone Encounter (Signed)
Copied from [place]. Topic: General - Other ?>> Sep 15, 2021  9:06 AM Tessa Lerner A wrote: ?Reason for CRM: The patient has called to request a prescription for Lorazepam 1 MG  ? ?The patient would like to take the medication to help them relax and quit smoking  ? ?Please contact the patient further when possible  ? ?The patient declined to schedule an appointment at the time of call with agent ?

## 2021-09-16 ENCOUNTER — Ambulatory Visit: Payer: Medicare Other | Attending: Pulmonary Disease

## 2021-09-16 DIAGNOSIS — R0600 Dyspnea, unspecified: Secondary | ICD-10-CM | POA: Insufficient documentation

## 2021-09-16 DIAGNOSIS — R0689 Other abnormalities of breathing: Secondary | ICD-10-CM | POA: Insufficient documentation

## 2021-09-16 LAB — PULMONARY FUNCTION TEST ARMC ONLY
DL/VA % pred: 55 %
DL/VA: 2.23 ml/min/mmHg/L
DLCO unc % pred: 41 %
DLCO unc: 8.34 ml/min/mmHg
FEF 25-75 Post: 0.62 L/sec
FEF 25-75 Pre: 0.48 L/sec
FEF2575-%Change-Post: 29 %
FEF2575-%Pred-Post: 39 %
FEF2575-%Pred-Pre: 30 %
FEV1-%Change-Post: 16 %
FEV1-%Pred-Post: 58 %
FEV1-%Pred-Pre: 49 %
FEV1-Post: 1.26 L
FEV1-Pre: 1.08 L
FEV1FVC-%Change-Post: 7 %
FEV1FVC-%Pred-Pre: 66 %
FEV6-%Change-Post: 7 %
FEV6-%Pred-Post: 81 %
FEV6-%Pred-Pre: 76 %
FEV6-Post: 2.25 L
FEV6-Pre: 2.1 L
FEV6FVC-%Change-Post: -1 %
FEV6FVC-%Pred-Post: 99 %
FEV6FVC-%Pred-Pre: 100 %
FVC-%Change-Post: 8 %
FVC-%Pred-Post: 82 %
FVC-%Pred-Pre: 76 %
FVC-Post: 2.4 L
FVC-Pre: 2.21 L
Post FEV1/FVC ratio: 53 %
Post FEV6/FVC ratio: 94 %
Pre FEV1/FVC ratio: 49 %
Pre FEV6/FVC Ratio: 95 %
RV % pred: 97 %
RV: 2.4 L
TLC % pred: 85 %
TLC: 4.59 L

## 2021-09-16 NOTE — Telephone Encounter (Signed)
Called patient and advised that Dr. Caryn Section need to see her to evaluate her for her nerves. Patient has an appointment scheduled for 4 month follow-up 05/23, but wanted something sooner. Patient is going to call us back to schedule a sooner appointment. ?

## 2021-09-16 NOTE — Telephone Encounter (Signed)
Yes please

## 2021-09-16 NOTE — Telephone Encounter (Signed)
Please advise whether patient needs to schedule an appointment to discuss starting requested medication. ?

## 2021-09-17 DIAGNOSIS — C4361 Malignant melanoma of right upper limb, including shoulder: Secondary | ICD-10-CM | POA: Diagnosis not present

## 2021-09-17 NOTE — Telephone Encounter (Signed)
Received approval form from [employer] for [facility] assistance.  ?Patient is aware and voiced her understanding.  ?Nothing further needed. ? ?

## 2021-09-19 ENCOUNTER — Ambulatory Visit (INDEPENDENT_AMBULATORY_CARE_PROVIDER_SITE_OTHER): Payer: Medicare Other

## 2021-09-19 ENCOUNTER — Ambulatory Visit (INDEPENDENT_AMBULATORY_CARE_PROVIDER_SITE_OTHER): Payer: Medicare Other | Admitting: Vascular Surgery

## 2021-09-19 ENCOUNTER — Encounter (INDEPENDENT_AMBULATORY_CARE_PROVIDER_SITE_OTHER): Payer: Self-pay | Admitting: Vascular Surgery

## 2021-09-19 VITALS — BP 152/63 | HR 80 | Resp 17 | Ht 66.0 in | Wt 155.0 lb

## 2021-09-19 DIAGNOSIS — N1832 Chronic kidney disease, stage 3b: Secondary | ICD-10-CM | POA: Diagnosis not present

## 2021-09-19 DIAGNOSIS — I6523 Occlusion and stenosis of bilateral carotid arteries: Secondary | ICD-10-CM | POA: Diagnosis not present

## 2021-09-19 DIAGNOSIS — E1140 Type 2 diabetes mellitus with diabetic neuropathy, unspecified: Secondary | ICD-10-CM | POA: Diagnosis not present

## 2021-09-19 DIAGNOSIS — I10 Essential (primary) hypertension: Secondary | ICD-10-CM | POA: Diagnosis not present

## 2021-09-19 NOTE — Assessment & Plan Note (Signed)
Follow-up with duplex and avoid contrast unless absolutely necessary ?

## 2021-09-19 NOTE — Progress Notes (Signed)
? ? ?MRN : [id number] ? ?Erica Rivas is a 81 y.o. (January 14, 1941) female who presents with chief complaint of No chief complaint on file. ?. ? ?History of Present Illness: Patient returns in follow-up of her carotid disease.  She is doing well.  She has not smoked in about a month and is congratulated on this.  She has had multiple skin cancers removed and does continue to have some anxiety.  No focal neurologic symptoms. Specifically, the patient denies amaurosis fugax, speech or swallowing difficulties, or arm or leg weakness or numbness. Carotid duplex today reveals a widely patent right carotid stent and mild 1 to 39% left ICA stenosis. ? ?Current Outpatient Medications  ?Medication Sig Dispense Refill  ? albuterol (PROVENTIL) (2.5 MG/3ML) 0.083% nebulizer solution Take 3 mLs (2.5 mg total) by nebulization 4 (four) times daily as needed for wheezing or shortness of breath (as needed up to 4 times per day.). 120 mL 3  ? albuterol (VENTOLIN HFA) 108 (90 Base) MCG/ACT inhaler INHALE 2 PUFFS BY MOUTH EVERY 6 HOURS AS NEEDED FOR WHEEZING AND FOR SHORTNESS OF BREATH 9 g 5  ? amLODipine (NORVASC) 5 MG tablet TAKE 1 TABLET BY MOUTH  DAILY 90 tablet 3  ? aspirin EC 81 MG tablet Take 81 mg by mouth daily.    ? atenolol (TENORMIN) 50 MG tablet TAKE 1 TABLET BY MOUTH  TWICE DAILY 180 tablet 3  ? atorvastatin (LIPITOR) 80 MG tablet TAKE 1 TABLET BY MOUTH  DAILY 90 tablet 3  ? Budeson-Glycopyrrol-Formoterol (BREZTRI AEROSPHERE) 160-9-4.8 MCG/ACT AERO Inhale 2 puffs into the lungs 2 (two) times daily. TAKE IN THE PLACE OF SYMBICORT 21.4 g 0  ? Budeson-Glycopyrrol-Formoterol (BREZTRI AEROSPHERE) 160-9-4.8 MCG/ACT AERO Inhale 2 puffs into the lungs in the morning and at bedtime. 11.8 g 0  ? Budeson-Glycopyrrol-Formoterol (BREZTRI AEROSPHERE) 160-9-4.8 MCG/ACT AERO Inhale 2 puffs into the lungs in the morning and at bedtime. 5.9 g 0  ? Cholecalciferol (VITAMIN D) 2000 UNITS tablet Take 1,000 Units by mouth daily.     ? furosemide  (LASIX) 20 MG tablet Take 1 tablet (20 mg total) by mouth daily as needed (swelling). 20 tablet 4  ? glipiZIDE (GLUCOTROL XL) 10 MG 24 hr tablet TAKE 1 TABLET BY MOUTH  DAILY 90 tablet 3  ? glucose blood (ONETOUCH ULTRA) test strip USE 1 STRIP TO CHECK GLUCOSE ONCE DAILY 50 each 4  ? lisinopril-hydrochlorothiazide (ZESTORETIC) 20-25 MG tablet TAKE 1 TABLET BY MOUTH  DAILY 90 tablet 3  ? metFORMIN (GLUCOPHAGE) 1000 MG tablet TAKE 1 TABLET BY MOUTH  TWICE DAILY 180 tablet 3  ? montelukast (SINGULAIR) 10 MG tablet TAKE 1 TABLET BY MOUTH  DAILY 90 tablet 3  ? OneTouch Delica Lancets 89F MISC USE TO CHECK BLOOD SUGAR DAILY 100 each 3  ? nicotine (NICODERM CQ) 14 mg/24hr patch Place 1 patch (14 mg total) onto the skin daily. (Patient not taking: Reported on 09/19/2021) 28 patch 0  ? valACYclovir (VALTREX) 1000 MG tablet 2 tablets twice a day for 1 day as needed for cold sores (Patient not taking: Reported on 08/26/2021) 12 tablet 2  ? ?No current facility-administered medications for this visit.  ? ? ?Past Medical History:  ?Diagnosis Date  ? Actinic keratosis   ? Anemia   ? Colon polyps   ? COPD (chronic obstructive pulmonary disease) ([place])   ? Depressive disorder 07/08/2007  ? Diabetes mellitus without complication ([place])   ? Hemorrhoids   ? History of chicken pox   ?  History of measles   ? History of mumps   ? Lung nodule 02/18/2015  ? No additional follow up needed per CT report 03/05/2015   ? Melanoma ([place]) 08/14/2020  ? right forearm, excised 09/17/2020 [facility]  ? 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 L pretibial - ED&C   ? Squamous cell carcinoma of skin 05/23/2019  ? L calf - ED&C   ? ? ?Past Surgical History:  ?Procedure Laterality Date  ? ABDOMINAL HYSTERECTOMY  1985  ? DUB; ovaries resected/ removed  ? BREAST BIOPSY Right 11/12/2014  ? fibroadenomatous  ? CARDIOVASCULAR STRESS TEST  02/2008  ? low risk  ? CAROTID PTA/STENT INTERVENTION Right 11/13/2019  ? Procedure: CAROTID PTA/STENT INTERVENTION;  Surgeon: Algernon Huxley, MD;  Location: [place] [facility];  Service: Cardiovascular;  Laterality: Right;  ? COLONOSCOPY WITH PROPOFOL N/A 01/18/2018  ? Procedure: COLONOSCOPY WITH PROPOFOL;  Surgeon: Lin Landsman, MD;  Location: [facility] ENDOSCOPY;  Service: Gastroenterology;  Laterality: N/A;  ? DOPPLER ECHOCARDIOGRAPHY  01/2010  ? EF>55%, LV relaxation impairment consistent with diastolic dysfunction, mild TR and MR  ? ESOPHAGOGASTRODUODENOSCOPY  07/2009  ? +diffuse gastritis. [place]  ? ESOPHAGOGASTRODUODENOSCOPY (EGD) WITH PROPOFOL N/A 01/18/2018  ? Procedure: ESOPHAGOGASTRODUODENOSCOPY (EGD) WITH PROPOFOL;  Surgeon: Lin Landsman, MD;  Location: [facility] END[facility]COPY;  Service: Gastroenterology;  Laterality: N/A;  ? [place]  ? HAND SURGERY Right 2010  ? surgery x 2. Dr. Sabra Heck  ? MELANOMA EXCISION Right 09/17/2020  ? Wide excision r forearm Dr. Arrie Aran, [facility]  ? NECK SURGERY  1998  ? Disc surgey on neck  ? Sinus Surgery    ? TUBAL LIGATION  1971  ? ? ? ?Social History  ? ?Tobacco Use  ? Smoking status: Former  ?  Packs/day: 3.00  ?  Years: 50.00  ?  Pack years: 150.00  ?  Types: Cigarettes  ?  Quit date: 08/21/2021  ?  Years since quitting: 0.0  ? Smokeless tobacco: Never  ? Tobacco comments:  ?  Had been down to a ppd.  Has not had a cigarette since 08/21/21.  ?Vaping Use  ? Vaping Use: Never used  ?Substance Use Topics  ? Alcohol use: No  ?  Alcohol/week: 0.0 standard drinks  ? Drug use: No  ? ? ? ? ?Family History  ?Problem Relation Age of Onset  ? Hypertension Sister   ? COPD Sister   ? Thyroid disease Sister   ? Breast cancer Sister 18  ? Thyroid disease Sister   ? Heart attack Mother   ? Hypertension Mother   ? Thyroid disease Mother   ?     HYPERTHYROIDISM  ? GI Bleed Father   ? Lung cancer Brother   ? Breast cancer Sister   ? Hypertension Sister   ? Aneurysm Sister   ?     in the back of her neck  ? Heart attack Brother   ?     CABG  ? ? ? ?Allergies  ?Allergen Reactions  ?  Bupropion Other (See Comments)  ?  fatigue  ? ? ?REVIEW OF SYSTEMS (Negative unless checked) ?  ?Constitutional: [] Weight loss  [] Fever  [] Chills ?Cardiac: [] Chest pain   [] Chest pressure   [] Palpitations   [] Shortness of breath when laying flat   [] Shortness of breath at rest   [] Shortness of breath with exertion. ?Vascular:  [] Pain in legs with walking   [] Pain in legs at rest   []   Pain in legs when laying flat   [] Claudication   [] Pain in feet when walking  [] Pain in feet at rest  [] Pain in feet when laying flat   [] History of DVT   [] Phlebitis   [] Swelling in legs   [] Varicose veins   [] Non-healing ulcers ?Pulmonary:   [] Uses home oxygen   [] Productive cough   [] Hemoptysis   [] Wheeze  [x] COPD   [x] Asthma ?Neurologic:  [] Dizziness  [] Blackouts   [] Seizures   [] History of stroke   [] History of TIA  [] Aphasia   [] Temporary blindness   [] Dysphagia   [] Weakness or numbness in arms   [] Weakness or numbness in legs ?Musculoskeletal:  [x] Arthritis   [] Joint swelling   [] Joint pain   [] Low back pain ?Hematologic:  [] Easy bruising  [] Easy bleeding   [] Hypercoagulable state   [x] Anemic  [] Hepatitis ?Gastrointestinal:  [] Blood in stool   [] Vomiting blood  [] Gastroesophageal reflux/heartburn   [] Difficulty swallowing. ?Genitourinary:  [] Chronic kidney disease   [] Difficult urination  [] Frequent urination  [] Burning with urination   [] Blood in urine ?Skin:  [] Rashes   [] Ulcers   [] Wounds ?Psychological:  [x] History of anxiety   []  History of major depression. ? ?Physical Examination ? ?Vitals:  ? 09/19/21 1018  ?BP: (!) 152/63  ?Pulse: 80  ?Resp: 17  ?Weight: 155 lb (70.3 kg)  ?Height: 5\' 6"  (1.676 m)  ? ?Body mass index is 25.02 kg/m?. ?Gen:  WD/WN, NAD ?Head: [place]/AT, No temporalis wasting. ?Ear/Nose/Throat: Hearing grossly intact, nares w/o erythema or drainage, trachea midline ?Eyes: Conjunctiva clear. Sclera non-icteric ?Neck: Supple.  No bruit  ?Pulmonary:  Good air movement, equal and clear to auscultation bilaterally.   ?Cardiac: RRR, No JVD ?Vascular:  ?Vessel Right Left  ?Radial Palpable Palpable  ?    ? ?Musculoskeletal: M/S 5/5 throughout.  No deformity or atrophy. No edema. ?Neurologic: CN 2-12 intact. Sensation grossly

## 2021-09-19 NOTE — Assessment & Plan Note (Signed)
Carotid duplex today reveals a widely patent right carotid stent and mild 1 to 39% left ICA stenosis.  Doing well.  Continue current medical regimen.  Recheck in 1 year. ?

## 2021-09-22 DIAGNOSIS — S32028A Other fracture of second lumbar vertebra, initial encounter for closed fracture: Secondary | ICD-10-CM | POA: Diagnosis not present

## 2021-09-22 DIAGNOSIS — C4361 Malignant melanoma of right upper limb, including shoulder: Secondary | ICD-10-CM | POA: Diagnosis not present

## 2021-09-22 DIAGNOSIS — C3490 Malignant neoplasm of unspecified part of unspecified bronchus or lung: Secondary | ICD-10-CM | POA: Diagnosis not present

## 2021-09-22 DIAGNOSIS — F17200 Nicotine dependence, unspecified, uncomplicated: Secondary | ICD-10-CM | POA: Diagnosis not present

## 2021-09-22 DIAGNOSIS — J449 Chronic obstructive pulmonary disease, unspecified: Secondary | ICD-10-CM | POA: Diagnosis not present

## 2021-09-22 DIAGNOSIS — R948 Abnormal results of function studies of other organs and systems: Secondary | ICD-10-CM | POA: Diagnosis not present

## 2021-09-22 DIAGNOSIS — S32029A Unspecified fracture of second lumbar vertebra, initial encounter for closed fracture: Secondary | ICD-10-CM | POA: Diagnosis not present

## 2021-09-24 ENCOUNTER — Ambulatory Visit: Payer: Medicare Other | Admitting: Dermatology

## 2021-09-25 ENCOUNTER — Telehealth: Payer: Self-pay

## 2021-09-25 ENCOUNTER — Encounter: Payer: Self-pay | Admitting: Pulmonary Disease

## 2021-09-25 NOTE — Telephone Encounter (Signed)
Copied from [place] [id number]. Topic: General - Other ?>> Sep 25, 2021  9:09 AM Tessa Lerner A wrote: ?Reason for CRM: Dr Harriet Masson with [facility] oncology would like to be contacted via cell by Dr. Caryn Section at [phone] regarding a non-urgent medical matter for the patient  ? ?The Doctor will not be in office on Friday 09/26/21 and would like to be contacted Monday 09/29/21 if/when possible  ? ?Please contact further ?

## 2021-09-27 ENCOUNTER — Encounter: Payer: Self-pay | Admitting: Family Medicine

## 2021-10-02 ENCOUNTER — Encounter: Payer: Self-pay | Admitting: Adult Health

## 2021-10-02 ENCOUNTER — Ambulatory Visit: Payer: Medicare Other | Admitting: Adult Health

## 2021-10-02 DIAGNOSIS — Z720 Tobacco use: Secondary | ICD-10-CM | POA: Diagnosis not present

## 2021-10-02 DIAGNOSIS — R911 Solitary pulmonary nodule: Secondary | ICD-10-CM

## 2021-10-02 DIAGNOSIS — J449 Chronic obstructive pulmonary disease, unspecified: Secondary | ICD-10-CM

## 2021-10-02 NOTE — Progress Notes (Signed)
@Patient  ID: Erica Rivas, female    DOB: [birth year], 81 y.o.   MRN: [id number]  Chief Complaint  Patient presents with   Follow-up    Referring provider: Birdie Sons, MD  HPI: 81 year old female with a heavy smoking history 150-pack-year history seen for pulmonary consult August 26, 2021 for shortness of breath and COPD  TEST/EVENTS :  enrolled in lung cancer screening, last study was 21 August 2020, she had a left lower lobe pulmonary nodule classified as lung RADS 4AS at that time because of a history of melanoma she had SBRT to this nodule at [facility].  She had recent PET/CT at [facility] (16 June 2021) that showed decrease in the size of that nodule without FDG uptake and no other FDG uptake was noted.  PET scan on care everywhere Sep 22, 2021 shows previously seen left lower lobe lung nodule no longer visualized.  No sites of metastatic disease noted.   10/02/2021 Follow up : COPD, Dyspnea , Lung nodule  Patient presents for a 1 month follow-up.  Patient was seen last visit for a pulmonary consult for shortness of breath and COPD.  She was set up for pulmonary function testing completed on Sep 16, 2021 that showed severe COPD with an FEV1 at 58%, ratio 53, FVC 82%, positive bronchodilator response and a DLCO at 41%.  2D echo shows EF at 60 to 28%, grade 1 diastolic dysfunction.  Right ventricular systolic function is normal.  RV size is normal.  Remains on Breztri Twice daily  along with singulair . Does feel Judithann Sauger is helping , has less dyspnea.  We reviewed her test results.  Since last visit patient has quit smoking.  She was congratulated on smoking cessation. On Ace inhibitor denies increased cough .  Influenza , Covid , Pneumovax are up to date.  She is followed at [facility].  She had a left lower lobe lung nodule that underwent SBRT.  Recent follow-up PET scan on Sep 22, 2021 shows previous left lower lobe lung nodule no longer visualized.  No sites of metastatic disease were noted. Does  complain of intermittent anxiety.  She says she has some anxiety medicine at home.  She is going to talk to her primary care doctor about getting refills when she sees him later this month.  Unsure of the name of this medicine.  Allergies  Allergen Reactions   Bupropion Other (See Comments)    fatigue    Immunization History  Administered Date(s) Administered   Fluad Quad(high Dose 65+) 01/25/2019, 01/26/2020, 02/13/2021   Influenza, High Dose Seasonal PF 02/18/2015, 03/02/2016, 01/19/2017, 03/12/2018   Moderna SARS-COV2 Booster Vaccination 09/06/2019, 03/15/2020   Moderna Sars-Covid-2 Vaccination 05/31/2019, 06/28/2019   Pfizer Covid-19 Vaccine Bivalent Booster 62yrs & up 02/13/2021   Pneumococcal Conjugate-13 10/24/2013   Pneumococcal Polysaccharide-23 04/01/2005, 05/18/2005, 04/26/2012   Tdap 01/01/2012   Zoster, Live 01/01/2012    Past Medical History:  Diagnosis Date   Actinic keratosis    Anemia    Colon polyps    Depressive disorder 07/08/2007   Hemorrhoids    History of chicken pox    History of measles    History of mumps    Lung nodule 02/18/2015   No additional follow up needed per CT report 03/05/2015    Melanoma ([place]) 08/14/2020   right forearm, excised 09/17/2020 [facility]   SCC (squamous cell carcinoma), hand    excision SCC left forearm and left hand 09/05/21 Dr. 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  Has not had a cigarette since 08/21/21.   Counseling given: Not Answered Tobacco comments: Had been down to a ppd.  Has not had a cigarette since 08/21/21.   Outpatient Medications  Prior to Visit  Medication Sig Dispense Refill   albuterol (PROVENTIL) (2.5 MG/3ML) 0.083% nebulizer solution Take 3 mLs (2.5 mg total) by nebulization 4 (four) times daily as needed for wheezing or shortness of breath (as needed up to 4 times per day.). 120 mL 3   albuterol (VENTOLIN HFA) 108 (90 Base) MCG/ACT inhaler INHALE 2 PUFFS BY MOUTH EVERY 6 HOURS AS NEEDED FOR WHEEZING AND FOR SHORTNESS OF BREATH 9 g 5   amLODipine (NORVASC) 5 MG tablet TAKE 1 TABLET BY MOUTH  DAILY 90 tablet 3   aspirin EC 81 MG tablet Take 81 mg by mouth daily.     atenolol (TENORMIN) 50 MG tablet TAKE 1 TABLET BY MOUTH  TWICE DAILY 180 tablet 3   atorvastatin (LIPITOR) 80 MG tablet TAKE 1 TABLET BY MOUTH  DAILY 90 tablet 3   Budeson-Glycopyrrol-Formoterol (BREZTRI AEROSPHERE) 160-9-4.8 MCG/ACT AERO Inhale 2 puffs into the lungs in the morning and at bedtime. 11.8 g 0   Cholecalciferol (VITAMIN D) 2000 UNITS tablet Take 1,000 Units by mouth daily.      furosemide (LASIX) 20 MG tablet Take 1 tablet (20 mg total) by mouth daily as needed (swelling). 20 tablet 4   glipiZIDE (GLUCOTROL XL) 10 MG 24 hr tablet TAKE 1 TABLET BY MOUTH  DAILY 90 tablet 3   glucose blood (ONETOUCH ULTRA) test strip USE 1 STRIP TO CHECK GLUCOSE ONCE DAILY 50 each 4   lisinopril-hydrochlorothiazide (ZESTORETIC) 20-25 MG tablet TAKE 1 TABLET BY MOUTH  DAILY 90 tablet 3   metFORMIN (GLUCOPHAGE) 1000 MG tablet TAKE 1 TABLET BY MOUTH  TWICE DAILY 180 tablet 3   montelukast (SINGULAIR) 10 MG tablet TAKE 1 TABLET BY MOUTH  DAILY 90 tablet 3   OneTouch Delica Lancets 32T MISC USE TO CHECK BLOOD SUGAR DAILY 100 each 3   valACYclovir (VALTREX) 1000 MG tablet 2 tablets twice a day for 1 day as needed for cold sores 12 tablet 2   Budeson-Glycopyrrol-Formoterol (BREZTRI AEROSPHERE) 160-9-4.8 MCG/ACT AERO Inhale 2 puffs into the lungs 2 (two) times daily. TAKE IN THE PLACE OF SYMBICORT 21.4 g 0   Budeson-Glycopyrrol-Formoterol (BREZTRI AEROSPHERE) 160-9-4.8  MCG/ACT AERO Inhale 2 puffs into the lungs in the morning and at bedtime. 5.9 g 0   nicotine (NICODERM CQ) 14 mg/24hr patch Place 1 patch (14 mg total) onto the skin daily. 28 patch 0   No facility-administered medications prior to visit.     Review of Systems:   Constitutional:   No  weight loss, night sweats,  Fevers, chills,  +fatigue, or  lassitude.  HEENT:   No headaches,  Difficulty swallowing,  Tooth/dental problems, or  Sore throat,                No sneezing, itching, ear ache,  nasal congestion, post nasal drip,   CV:  No chest pain,  Orthopnea, PND, swelling in lower extremities, anasarca, dizziness, palpitations, syncope.   GI  No heartburn, indigestion, abdominal pain, nausea, vomiting, diarrhea, change in bowel habits, loss of appetite, bloody stools.   Resp:  No excess mucus, no productive cough,  No non-productive cough,  No coughing up of blood.  No change in color of mucus.  No wheezing.  No chest wall deformity  Skin: no rash or lesions.  GU: no dysuria, change in color of urine, no urgency or frequency.  No flank pain, no hematuria   MS:  No joint pain or swelling.  No decreased range of motion.  No back pain.    Physical Exam  BP 110/60 (BP Location: Left Arm, Cuff Size: Normal)   Pulse 83   Temp 97.7 F (36.5 C) (Temporal)   Ht 5\' 6"  (1.676 m)   Wt 161 lb (73 kg)   SpO2 97%   BMI 25.99 kg/m   GEN: A/Ox3; pleasant , NAD, elderly    HEENT:  [place]/AT,  NOSE-clear, THROAT-clear, no lesions, no postnasal drip or exudate noted.   NECK:  Supple w/ fair ROM; no JVD; normal carotid impulses w/o bruits; no thyromegaly or nodules palpated; no lymphadenopathy.    RESP  few trace rhonchi  no accessory muscle use, no dullness to percussion  CARD:  RRR, no m/r/g, no peripheral edema, pulses intact, no cyanosis or clubbing.  GI:   Soft & nt; nml bowel sounds; no organomegaly or masses detected.   Musco: Warm bil, no deformities or joint swelling noted.    Neuro: alert, no focal deficits noted.    Skin: Warm, no lesions or rashes    Lab Results:  CBC   BMET   BNP   ProBNP No results found for: PROBNP  Imaging: ECHOCARDIOGRAM COMPLETE  Result Date: 09/11/2021    ECHOCARDIOGRAM REPORT   Patient Name:   MISHKA STEGEMANN Date of Exam: 09/11/2021 Medical Rec #:  [id number]      Height:       66.0 in Accession #:    [id number]     Weight:       157.4 lb Date of Birth:  [birth year]      BSA:          1.806 m Patient Age:    47 years       BP:           110/58 mmHg Patient Gender: F              HR:           75 bpm. Exam Location:  [place] Procedure: 2D Echo, Cardiac Doppler and Color Doppler Indications:    R06.02 SOB  History:        Patient has no prior history of Echocardiogram examinations.                 COPD, Signs/Symptoms:Shortness of Breath and Fatigue; Risk                 Factors:Hypertension, Diabetes, Dyslipidemia and Former Smoker.  Sonographer:    Pilar Jarvis RDMS, RVT, RDCS Referring Phys: [id number] CARMEN L GONZALEZ IMPRESSIONS  1. Left ventricular ejection fraction, by estimation, is 60 to 65%. The left ventricle has normal function. The left ventricle has no regional wall motion abnormalities. Left ventricular diastolic parameters are consistent with Grade I diastolic dysfunction (impaired relaxation).  2. Right ventricular systolic  function is normal. The right ventricular size is normal.  3. The mitral valve is normal in structure. No evidence of mitral valve regurgitation. No evidence of mitral stenosis.  4. The aortic valve is normal in structure. Aortic valve regurgitation is not visualized. Aortic valve sclerosis/calcification is present, without any evidence of aortic stenosis.  5. The inferior vena cava is normal in size with greater than 50% respiratory variability, suggesting right atrial pressure of 3 mmHg. FINDINGS  Left Ventricle: Left ventricular ejection fraction, by estimation, is 60 to 65%. The left ventricle has  normal function. The left ventricle has no regional wall motion abnormalities. The left ventricular internal cavity size was normal in size. There is  no left ventricular hypertrophy. Left ventricular diastolic parameters are consistent with Grade I diastolic dysfunction (impaired relaxation). Right Ventricle: The right ventricular size is normal. No increase in right ventricular wall thickness. Right ventricular systolic function is normal. Left Atrium: Left atrial size was normal in size. Right Atrium: Right atrial size was normal in size. Pericardium: There is no evidence of pericardial effusion. Mitral Valve: The mitral valve is normal in structure. Mild mitral annular calcification. No evidence of mitral valve regurgitation. No evidence of mitral valve stenosis. Tricuspid Valve: The tricuspid valve is normal in structure. Tricuspid valve regurgitation is not demonstrated. No evidence of tricuspid stenosis. Aortic Valve: The aortic valve is normal in structure. Aortic valve regurgitation is not visualized. Aortic valve sclerosis/calcification is present, without any evidence of aortic stenosis. Aortic valve mean gradient measures 9.5 mmHg. Aortic valve peak  gradient measures 16.8 mmHg. Aortic valve area, by VTI measures 1.75 cm. Pulmonic Valve: The pulmonic valve was normal in structure. Pulmonic valve regurgitation is not visualized. No evidence of pulmonic stenosis. Aorta: The aortic root is normal in size and structure. Venous: The inferior vena cava is normal in size with greater than 50% respiratory variability, suggesting right atrial pressure of 3 mmHg. IAS/Shunts: No atrial level shunt detected by color flow Doppler.  LEFT VENTRICLE PLAX 2D LVIDd:         4.30 cm     Diastology LVIDs:         2.90 cm     LV e' medial:    5.44 cm/s LV PW:         0.90 cm     LV E/e' medial:  17.9 LV IVS:        1.00 cm     LV e' lateral:   4.35 cm/s LVOT diam:     1.90 cm     LV E/e' lateral: 22.4 LV SV:         77 LV  SV Index:   43 LVOT Area:     2.84 cm  LV Volumes (MOD) LV vol d, MOD A2C: 56.8 ml LV vol d, MOD A4C: 62.1 ml LV vol s, MOD A2C: 26.7 ml LV vol s, MOD A4C: 27.0 ml LV SV MOD A2C:     30.1 ml LV SV MOD A4C:     62.1 ml LV SV MOD BP:      32.4 ml RIGHT VENTRICLE             IVC RV Basal diam:  3.90 cm     IVC diam: 1.10 cm RV S prime:     12.70 cm/s TAPSE (M-mode): 2.6 cm LEFT ATRIUM             Index        RIGHT ATRIUM  Index LA diam:        3.50 cm 1.94 cm/m   RA Area:     16.10 cm LA Vol (A2C):   43.0 ml 23.81 ml/m  RA Volume:   40.40 ml  22.37 ml/m LA Vol (A4C):   36.2 ml 20.04 ml/m LA Biplane Vol: 39.6 ml 21.92 ml/m  AORTIC VALVE                     PULMONIC VALVE AV Area (Vmax):    1.56 cm      PV Vmax:       1.11 m/s AV Area (Vmean):   1.60 cm      PV Peak grad:  4.9 mmHg AV Area (VTI):     1.75 cm AV Vmax:           205.00 cm/s AV Vmean:          139.500 cm/s AV VTI:            0.439 m AV Peak Grad:      16.8 mmHg AV Mean Grad:      9.5 mmHg LVOT Vmax:         113.00 cm/s LVOT Vmean:        78.900 cm/s LVOT VTI:          0.271 m LVOT/AV VTI ratio: 0.62  AORTA Ao Root diam: 2.70 cm Ao Asc diam:  3.00 cm Ao Arch diam: 2.4 cm MITRAL VALVE                TRICUSPID VALVE MV Area (PHT): 1.91 cm     TR Peak grad:   28.5 mmHg MV Decel Time: 398 msec     TR Vmax:        267.00 cm/s MV E velocity: 97.40 cm/s MV A velocity: 141.00 cm/s  SHUNTS MV E/A ratio:  0.69         Systemic VTI:  0.27 m                             Systemic Diam: 1.90 cm Ida Rogue MD Electronically signed by Ida Rogue MD Signature Date/Time: 09/11/2021/2:41:34 PM    Final    Pulmonary Function Test [facility] Only  Result Date: 09/17/2021 Spirometry Data Is NOT Acceptable and Reproducible Severe Obstructive Airways Disease with Significant BD response Consider Outpatient Pulmonary Consultation if needed Clinical Correlation Advised  VAS US CAROTID  Result Date: 09/22/2021 Carotid Arterial Duplex Study Patient Name:   NILAYA BOUIE  Date of Exam:   09/19/2021 Medical Rec #: [id number]       Accession #:    [id number] Date of Birth: Sep 16, 1940       Patient Gender: F Patient Age:   39 years Exam Location:  [facility] Procedure:      VAS US CAROTID Referring Phys: Leotis Pain --------------------------------------------------------------------------------  Indications:       Carotid artery disease. Other Factors:     11/13/2019 Rt IcA stent. Comparison Study:  09/20/2020 Performing Technologist: Almira Coaster RVS  Examination Guidelines: A complete evaluation includes B-mode imaging, spectral Doppler, color Doppler, and power Doppler as needed of all accessible portions of each vessel. Bilateral testing is considered an integral part of a complete examination. Limited examinations for reoccurring indications may be performed as noted.  Right Carotid Findings: +----------+--------+--------+--------+------------------+--------+           PSV cm/sEDV cm/sStenosisPlaque  DescriptionComments +----------+--------+--------+--------+------------------+--------+ CCA Prox  42      8                                          +----------+--------+--------+--------+------------------+--------+ CCA Mid   56      11                                         +----------+--------+--------+--------+------------------+--------+ CCA Distal52      11                                         +----------+--------+--------+--------+------------------+--------+ ICA Prox  87      21                                         +----------+--------+--------+--------+------------------+--------+ ICA Mid   91      24                                         +----------+--------+--------+--------+------------------+--------+ ICA Distal83      16                                         +----------+--------+--------+--------+------------------+--------+ ECA       180     0                                           +----------+--------+--------+--------+------------------+--------+ +----------+--------+-------+--------+-------------------+           PSV cm/sEDV cmsDescribeArm Pressure (mmHG) +----------+--------+-------+--------+-------------------+ [id number]      0                                  +----------+--------+-------+--------+-------------------+ +---------+--------+--+--------+-+ VertebralPSV cm/s32EDV cm/s7 +---------+--------+--+--------+-+  Left Carotid Findings: +----------+--------+--------+--------+------------------+--------+           PSV cm/sEDV cm/sStenosisPlaque DescriptionComments +----------+--------+--------+--------+------------------+--------+ CCA Prox  96      16                                         +----------+--------+--------+--------+------------------+--------+ CCA Mid   88      14                                         +----------+--------+--------+--------+------------------+--------+ CCA Distal63      11                                         +----------+--------+--------+--------+------------------+--------+ ICA Prox  57      11                                         +----------+--------+--------+--------+------------------+--------+ ICA Mid   74      18                                         +----------+--------+--------+--------+------------------+--------+ ICA Distal91      22                                         +----------+--------+--------+--------+------------------+--------+ ECA       140     0                                          +----------+--------+--------+--------+------------------+--------+ +----------+--------+--------+--------+-------------------+           PSV cm/sEDV cm/sDescribeArm Pressure (mmHG) +----------+--------+--------+--------+-------------------+ [id number]     0                                    +----------+--------+--------+--------+-------------------+ +---------+--------+--+--------+--+ VertebralPSV cm/s67EDV cm/s14 +---------+--------+--+--------+--+   Summary: Right Carotid: Velocities in the right ICA are consistent with a 1-39% stenosis. Left Carotid: Velocities in the left ICA are consistent with a 1-39% stenosis. Vertebrals:  Bilateral vertebral arteries demonstrate antegrade flow. Subclavians: Normal flow hemodynamics were seen in bilateral subclavian              arteries. *See table(s) above for measurements and observations.  Electronically signed by Leotis Pain MD on 09/22/2021 at 12:27:33 PM.    Final           View : No data to display.          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  Patient was congratulated on smoking cessation. Patient is continue on current maintenance regimen.  Plan  Patient Instructions  Continue on BREZTRI inhaler 2 puffs Twice daily, rinse well after.  Albuterol inhaler As needed   Continue on Singulair daily .  Great job not smoking .  Follow up with Dr. 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  Recent follow-up PET scan Sep 22, 2021 shows left lower lobe lung nodule has resolved.  No sites of metastatic disease noted. Continue to follow-up with radiation oncology. Continue with serial CT surveillance     Rexene Edison, NP 10/02/2021

## 2021-10-02 NOTE — Patient Instructions (Signed)
Continue on BREZTRI inhaler 2 puffs Twice daily, rinse well after.  Albuterol inhaler As needed   Continue on Singulair daily .  Great job not smoking .  Follow up with Dr. Patsey Berthold in 4-6 months and As needed

## 2021-10-02 NOTE — Assessment & Plan Note (Signed)
Suspicious left lower lobe lung nodule presumed malignant status post SBRT.  Recent follow-up PET scan Sep 22, 2021 shows left lower lobe lung nodule has resolved.  No sites of metastatic disease noted. Continue to follow-up with radiation oncology. Continue with serial CT surveillance

## 2021-10-02 NOTE — Assessment & Plan Note (Signed)
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  Patient was congratulated on smoking cessation. Patient is continue on current maintenance regimen.  Plan  Patient Instructions  Continue on BREZTRI inhaler 2 puffs Twice daily, rinse well after.  Albuterol inhaler As needed   Continue on Singulair daily .  Great job not smoking .  Follow up with Dr. Patsey Berthold in 4-6 months and As needed

## 2021-10-02 NOTE — Assessment & Plan Note (Signed)
Congratulated on smoking cessation.  

## 2021-10-02 NOTE — Progress Notes (Signed)
Agree with the details of the visit as noted by Tammy Parrett, NP.  C. Laura Reylynn Vanalstine, MD [facility] 

## 2021-10-07 ENCOUNTER — Encounter: Payer: Self-pay | Admitting: Family Medicine

## 2021-10-07 ENCOUNTER — Ambulatory Visit (INDEPENDENT_AMBULATORY_CARE_PROVIDER_SITE_OTHER): Payer: Medicare Other | Admitting: Family Medicine

## 2021-10-07 VITALS — BP 124/59 | HR 71 | Temp 98.6°F | Resp 16 | Wt 158.6 lb

## 2021-10-07 DIAGNOSIS — J449 Chronic obstructive pulmonary disease, unspecified: Secondary | ICD-10-CM | POA: Diagnosis not present

## 2021-10-07 DIAGNOSIS — Z720 Tobacco use: Secondary | ICD-10-CM

## 2021-10-07 DIAGNOSIS — F331 Major depressive disorder, recurrent, moderate: Secondary | ICD-10-CM

## 2021-10-07 DIAGNOSIS — F430 Acute stress reaction: Secondary | ICD-10-CM | POA: Diagnosis not present

## 2021-10-07 DIAGNOSIS — E1140 Type 2 diabetes mellitus with diabetic neuropathy, unspecified: Secondary | ICD-10-CM

## 2021-10-07 LAB — POCT GLYCOSYLATED HEMOGLOBIN (HGB A1C): Hemoglobin A1C: 7.4 % — AB (ref 4.0–5.6)

## 2021-10-07 MED ORDER — CITALOPRAM HYDROBROMIDE 10 MG PO TABS: 20.0000 mg | ORAL_TABLET | Freq: Every evening | ORAL | Status: DC

## 2021-10-07 MED ORDER — LORAZEPAM 0.5 MG PO TABS: 0.5000 mg | ORAL_TABLET | Freq: Every morning | ORAL | 0 refills | Status: DC

## 2021-10-07 NOTE — Patient Instructions (Signed)
Please review the attached list of medications and notify my office if there are any errors.   Increase citalopram to 20 mg a day by taking 2 of the 10mg  tablets every night instead of just 1

## 2021-10-07 NOTE — Progress Notes (Addendum)
I,Jana Robinson,acting as a scribe for Lelon Huh, MD.,have documented all relevant documentation on the behalf of Lelon Huh, MD,as directed by  Lelon Huh, MD while in the presence of Lelon Huh, MD.   Established patient visit   Patient: Erica Rivas   DOB: 1940/07/18   81 y.o. Female  MRN: [id number] Visit Date: 10/07/2021  Today's healthcare provider: Lelon Huh, MD   Chief Complaint  Patient presents with   Diabetes   Subjective    HPI  Diabetes Mellitus Type II, Follow-up  Lab Results  Component Value Date   HGBA1C 7.7 (H) 06/04/2021   HGBA1C 7.1 (A) 03/04/2021   HGBA1C 7.0 (A) 08/06/2020   Wt Readings from Last 3 Encounters:  10/07/21 158 lb 9.6 oz (71.9 kg)  10/02/21 161 lb (73 kg)  09/19/21 155 lb (70.3 kg)   Last seen for diabetes 4 months ago.  Management since then includes follow strict diet, continue same meds. She reports excellent compliance with treatment.  Symptoms: Yes fatigue No foot ulcerations  Yes appetite changes No nausea  No paresthesia of the feet  No polydipsia  No polyuria No visual disturbances   No vomiting     Home blood sugar records: fasting range: average 109  Episodes of hypoglycemia? No  Current insulin regiment: none Most Recent Eye Exam: 06-23-21 Current exercise: none Current diet habits: well balanced  Pertinent Labs: Lab Results  Component Value Date   CHOL 140 08/08/2020   HDL 56 08/08/2020   LDLCALC 67 08/08/2020   TRIG 90 08/08/2020   CHOLHDL 2.5 08/08/2020   Lab Results  Component Value Date   NA 138 01/31/2021   K 4.6 01/31/2021   CREATININE 1.21 (H) 01/31/2021   EGFR 46 (L) 01/31/2021   MICROALBUR 20 07/13/2016     ---------------------------------------------------------------------------------------------------  She stopped smoking in early April and has been much more anxious since then. She still occasionally smokes one of her daughters cigarettes. She was recently prescribed  citalopram, lorazepam and hydroxyzine by her oncologists which helps a bit.   Medications: Outpatient Medications Prior to Visit  Medication Sig   albuterol (PROVENTIL) (2.5 MG/3ML) 0.083% nebulizer solution Take 3 mLs (2.5 mg total) by nebulization 4 (four) times daily as needed for wheezing or shortness of breath (as needed up to 4 times per day.).   albuterol (VENTOLIN HFA) 108 (90 Base) MCG/ACT inhaler INHALE 2 PUFFS BY MOUTH EVERY 6 HOURS AS NEEDED FOR WHEEZING AND FOR SHORTNESS OF BREATH   amLODipine (NORVASC) 5 MG tablet TAKE 1 TABLET BY MOUTH  DAILY   aspirin EC 81 MG tablet Take 81 mg by mouth daily.   atenolol (TENORMIN) 50 MG tablet TAKE 1 TABLET BY MOUTH  TWICE DAILY   atorvastatin (LIPITOR) 80 MG tablet TAKE 1 TABLET BY MOUTH  DAILY   Budeson-Glycopyrrol-Formoterol (BREZTRI AEROSPHERE) 160-9-4.8 MCG/ACT AERO Inhale 2 puffs into the lungs in the morning and at bedtime.   Cholecalciferol (VITAMIN D) 2000 UNITS tablet Take 1,000 Units by mouth daily.    citalopram (CELEXA) 10 MG tablet Take 10 mg by mouth daily.   furosemide (LASIX) 20 MG tablet Take 1 tablet (20 mg total) by mouth daily as needed (swelling).   glipiZIDE (GLUCOTROL XL) 10 MG 24 hr tablet TAKE 1 TABLET BY MOUTH  DAILY   glucose blood (ONETOUCH ULTRA) test strip USE 1 STRIP TO CHECK GLUCOSE ONCE DAILY   hydrOXYzine (ATARAX) 25 MG tablet Take 12.5 mg by mouth at bedtime.  lisinopril-hydrochlorothiazide (ZESTORETIC) 20-25 MG tablet TAKE 1 TABLET BY MOUTH  DAILY   LORazepam (ATIVAN) 0.5 MG tablet Take 0.5 mg by mouth in the morning. As needed   metFORMIN (GLUCOPHAGE) 1000 MG tablet TAKE 1 TABLET BY MOUTH  TWICE DAILY   montelukast (SINGULAIR) 10 MG tablet TAKE 1 TABLET BY MOUTH  DAILY   OneTouch Delica Lancets 26E MISC USE TO CHECK BLOOD SUGAR DAILY   valACYclovir (VALTREX) 1000 MG tablet 2 tablets twice a day for 1 day as needed for cold sores   No facility-administered medications prior to visit.         Objective    BP (!) 124/59 (BP Location: Left Arm, Patient Position: Sitting, Cuff Size: Normal)   Pulse 71   Temp 98.6 F (37 C) (Oral)   Resp 16   Wt 158 lb 9.6 oz (71.9 kg)   SpO2 99%   BMI 25.60 kg/m    Physical Exam   General: Appearance:     Well developed, well nourished female in no acute distress  Eyes:    PERRL, conjunctiva/corneas clear, EOM's intact       Lungs:     Clear to auscultation bilaterally, respirations unlabored  Heart:    Normal heart rate. Normal rhythm. No murmurs, rubs, or gallops.    MS:   All extremities are intact.    Neurologic:   Awake, alert, oriented x 3. No apparent focal neurological defect.         Results for orders placed or performed in visit on 10/07/21  POCT glycosylated hemoglobin (Hb A1C)  Result Value Ref Range   Hemoglobin A1C 7.4 (A) 4.0 - 5.6 %    Assessment & Plan     1. Type 2 diabetes mellitus with diabetic neuropathy, without long-term current use of insulin ([place]) Fairly well controlled. Continue current medications.   - Urine Albumin-Creatinine with uACR  2. Acute reaction to situational stress Secondary to new health concerns and smoking cessation 6 weeks ago. Recently started on citalopram 77m . Will increase to  citalopram (CELEXA) 10 MG tablet; Take 2 tablets (20 mg total) by mouth every evening.  Continue  hydrOXYzine (ATARAX) 25 MG tablet; Take 12.5 mg by mouth at bedtime prescribed by her oncologist.   Refill  LORazepam (ATIVAN) 0.5 MG tablet; Take 1 tablet (0.5 mg total) by mouth in the morning. As needed which was recently started by her oncologist.  Dispense: 30 tablet; Refill: 0 -  3. Tobacco abuse Now quit smoking for 6 weeks.   4. Chronic obstructive pulmonary disease, unspecified COPD type ([place] Doing well on current inhalers and has quit smoking.   5. Moderate episode of recurrent major depressive disorder ([place] Now on SSRI primary prescribed for anxiety from smoking cessation but should also help  with depression.       The entirety of the information documented in the History of Present Illness, Review of Systems and Physical Exam were personally obtained by me. Portions of this information were initially documented by the CMA and reviewed by me for thoroughness and accuracy.     DLelon Huh MD  [phone](phone) [phone](fax)  [place]

## 2021-10-08 LAB — SPECIMEN STATUS REPORT

## 2021-10-08 LAB — MICROALBUMIN / CREATININE URINE RATIO
Creatinine, Urine: 41.5 mg/dL
Microalb/Creat Ratio: 31 mg/g creat — ABNORMAL HIGH (ref 0–29)
Microalbumin, Urine: 12.8 ug/mL

## 2021-10-31 ENCOUNTER — Other Ambulatory Visit: Payer: Self-pay | Admitting: Family Medicine

## 2021-10-31 DIAGNOSIS — F430 Acute stress reaction: Secondary | ICD-10-CM

## 2021-10-31 NOTE — Telephone Encounter (Signed)
Medication Refill - Medication: citalopram (CELEXA) 10 MG tablet [[id number]]   Has the patient contacted their pharmacy? Yes.     Preferred Pharmacy (with phone number or street name):  [place], [place] - [address] [place] [address]  Phone: [phone] Fax: [phone]  Hours: Not open 24 hours    Has the patient been seen for an appointment in the last year OR does the patient have an upcoming appointment? Yes.    Agent: Please be advised that RX refills may take up to 3 business days. We ask that you follow-up with your pharmacy.

## 2021-10-31 NOTE — Telephone Encounter (Signed)
Requested medication (s) are due for refill today:   Requested medication (s) are on the active medication list: Yes  Last refill:  10/07/21  Future visit scheduled: Yes  Notes to clinic:  See request.    Requested Prescriptions  Pending Prescriptions Disp Refills   citalopram (CELEXA) 10 MG tablet      Sig: Take 2 tablets (20 mg total) by mouth every evening.     Psychiatry:  Antidepressants - SSRI Passed - 10/31/2021 11:38 AM      Passed - Completed PHQ-2 or PHQ-9 in the last 360 days      Passed - Valid encounter within last 6 months    Recent Outpatient Visits           3 weeks ago Type 2 diabetes mellitus with diabetic neuropathy, without long-term current use of insulin [facility])   [facility] Birdie Sons, MD   2 months ago Cough   [facility] Birdie Sons, MD   4 months ago Malignant melanoma of right forearm [facility])   [place] [facility] Birdie Sons, MD   4 months ago Iron deficiency anemia, unspecified iron deficiency anemia type   [facility] Birdie Sons, MD   6 months ago 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

## 2021-11-01 MED ORDER — CITALOPRAM HYDROBROMIDE 20 MG PO TABS: 20.0000 mg | ORAL_TABLET | Freq: Every evening | ORAL | 1 refills | Status: DC

## 2021-11-25 ENCOUNTER — Other Ambulatory Visit: Payer: Self-pay | Admitting: Family Medicine

## 2021-11-25 ENCOUNTER — Ambulatory Visit: Payer: Medicare Other | Admitting: Pulmonary Disease

## 2021-11-25 ENCOUNTER — Encounter: Payer: Self-pay | Admitting: Pulmonary Disease

## 2021-11-25 VITALS — BP 116/62 | HR 76 | Temp 97.7°F | Ht 66.0 in | Wt 158.6 lb

## 2021-11-25 DIAGNOSIS — R0689 Other abnormalities of breathing: Secondary | ICD-10-CM | POA: Diagnosis not present

## 2021-11-25 DIAGNOSIS — J449 Chronic obstructive pulmonary disease, unspecified: Secondary | ICD-10-CM

## 2021-11-25 DIAGNOSIS — F17210 Nicotine dependence, cigarettes, uncomplicated: Secondary | ICD-10-CM | POA: Diagnosis not present

## 2021-11-25 DIAGNOSIS — Z1231 Encounter for screening mammogram for malignant neoplasm of breast: Secondary | ICD-10-CM

## 2021-11-25 DIAGNOSIS — R0600 Dyspnea, unspecified: Secondary | ICD-10-CM | POA: Diagnosis not present

## 2021-11-25 MED ORDER — BREZTRI AEROSPHERE 160-9-4.8 MCG/ACT IN AERO: 2.0000 | INHALATION_SPRAY | Freq: Two times a day (BID) | RESPIRATORY_TRACT | 0 refills | Status: DC

## 2021-11-25 NOTE — Patient Instructions (Signed)
We are going to check an oxygen level while you sleep.  Continue taking Breztri 2 puffs twice a day.  Continue to decrease your cigarette use.  We will see you in follow-up in 4 months time call sooner should any new problems arise.

## 2021-11-25 NOTE — Progress Notes (Signed)
Subjective:    Patient ID: Erica Rivas, female    DOB: [birth year], 81 y.o.   MRN: [id number] Patient Care Team: Birdie Sons, MD as PCP - General (Family Medicine) Dasher, Rayvon Char, MD as Consulting Physician (Dermatology) Agapito Games as Consulting Physician (Optometry) Hayden Pedro, MD as Consulting Physician (Ophthalmology) Rickard Patience, MD as Referring Physician (Surgery) Tyler Pita, MD as Consulting Physician (Pulmonary Disease)  Chief Complaint  Patient presents with   Follow-up    SPB with exertion and prod cough with clear sputum.     HPI 81 year old female with a heavy smoking history 150-pack-year history seen for pulmonary consult by me on August 26, 2021 for shortness of breath and COPD.  She was subsequently seen in follow-up by Rexene Edison, NP on 02 Oct 2021, at that time she was using Breztri 2 puffs twice a day regularly and noted marked improvement on her symptoms with this medication.  She continues to note significant wellbeing with this medication. At her prior visit she had stopped smoking but unfortunately has relapsed she is smoking 7 to 8 cigarettes/day.  We counseled her extensively today with regards to discontinuation of smoking.  She also uses Singulair for perennial allergic rhinitis symptoms.  She is compliant with the medication.  She has not had any fevers, chills or sweats.  Cough is at baseline with minimal sputum production in the mornings.  No hemoptysis.  No orthopnea or paroxysmal nocturnal dyspnea.  Continues to have significant dyspnea on exertion.  Is complaining of waking up "groggy" in the mornings.  Husband present does not note that she has either snoring nor apneic episodes.   DATA/EVENTS 08/21/2020 LDCT lung cancer screening: Left lower lobe pulmonary nodule classified as lung RADS 4AS at that time because of a history of melanoma she had SBRT to this nodule at [facility].  03/10/2021 to 06/13/2021 chemotherapy: Underwent  melanoma chemotherapy at [facility] 06/16/2021 PET/CT [facility]: Showed decrease in the size of that nodule without FDG uptake and no other FDG uptake was noted. 09/16/2021 PFTs: FEV1 1.08 L or 49% predicted, FVC 2.21 L or 76% predicted, FEV1/FVC 49%.  There is significant bronchodilator response.  Difusion capacity moderately reduced.  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  Asthma/COPD overlap. 09/22/2021 PET/CT [facility]: Previously seen left lower lobe nodule no longer visualized, no other sites of metastatic disease.   Review of Systems A 10 point review of systems was performed and it is as noted above otherwise negative.  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 ([place]) 09/30/2020   Malignant melanoma of right forearm ([place]) 09/11/2020   Carotid stenosis, right 11/13/2019   Carotid stenosis 10/05/2019   Emphysema lung ([place]) 09/25/2019   Moderate episode of recurrent major depressive disorder ([place]) 07/05/2018   Duodenitis 01/21/2018   Iron deficiency anemia 11/25/2017   Atherosclerosis of aorta ([place]) 07/23/2016   Coronary atherosclerosis 07/23/2016   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/2015   Allergic rhinitis 02/18/2015   Anxiety 02/18/2015   Aortic root dilation ([place]) 02/18/2015   Chronic kidney disease (CKD), stage III (moderate) (HCC) 02/18/2015   Edema 02/18/2015   Insomnia 02/18/2015   Lung  nodule 02/18/2015   Mass of pancreas 02/18/2015   Osteopenia 02/18/2015   Elevated WBC count 02/18/2015   Weight loss 02/18/2015   Diastolic dysfunction 02/18/2015   Vitamin B12 deficiency anemia 11/04/2014   Vitamin D deficiency 10/27/2013   Internal  hemorrhoids without complication 09/16/2009   Smoking greater than 30 pack years 07/08/2007   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   Disc disorder of lumbar region 07/08/2007   Esophageal reflux 07/08/2007   Family history of breast cancer 07/08/2007   History of adenomatous polyp of colon 07/08/2007   History of other malignant neoplasm of skin 07/08/2007   Primary hypertension 07/08/2007   Pure hypercholesterolemia 07/08/2007   Social History   Tobacco Use   Smoking status: Every Day    Packs/day: 2.00    Years: 60.00    Total pack years: 120.00    Types: Cigarettes   Smokeless tobacco: Never   Tobacco comments:    7-8 cigarettes daily-11/25/2021  Substance Use Topics   Alcohol use: No    Alcohol/week: 0.0 standard drinks of alcohol   Allergies  Allergen Reactions   Bupropion Other (See Comments)    fatigue   Current Meds  Medication Sig   albuterol (PROVENTIL) (2.5 MG/3ML) 0.083% nebulizer solution Take 3 mLs (2.5 mg total) by nebulization 4 (four) times daily as needed for wheezing or shortness of breath (as needed up to 4 times per day.).   albuterol (VENTOLIN HFA) 108 (90 Base) MCG/ACT inhaler INHALE 2 PUFFS BY MOUTH EVERY 6 HOURS AS NEEDED FOR WHEEZING AND FOR SHORTNESS OF BREATH   amLODipine (NORVASC) 5 MG tablet TAKE 1 TABLET BY MOUTH  DAILY   aspirin EC 81 MG tablet Take 81 mg by mouth daily.   atenolol (TENORMIN) 50 MG tablet TAKE 1 TABLET BY MOUTH  TWICE DAILY   atorvastatin (LIPITOR) 80 MG tablet TAKE 1 TABLET BY MOUTH  DAILY   Budeson-Glycopyrrol-Formoterol (BREZTRI AEROSPHERE) 160-9-4.8 MCG/ACT AERO Inhale 2 puffs into the lungs in the morning and at bedtime.   Cholecalciferol (VITAMIN D) 2000 UNITS tablet Take 1,000 Units by mouth daily.    furosemide (LASIX) 20 MG tablet Take 1 tablet (20 mg total) by mouth daily as needed (swelling).   glucose blood (ONETOUCH ULTRA) test strip USE 1 STRIP TO CHECK GLUCOSE ONCE DAILY    hydrOXYzine (ATARAX) 25 MG tablet Take 12.5 mg by mouth at bedtime.   lisinopril-hydrochlorothiazide (ZESTORETIC) 20-25 MG tablet TAKE 1 TABLET BY MOUTH  DAILY   OneTouch Delica Lancets 33G MISC USE TO CHECK BLOOD SUGAR DAILY   valACYclovir (VALTREX) 1000 MG tablet 2 tablets twice a day for 1 day as needed for cold sores   [DISCONTINUED] Budeson-Glycopyrrol-Formoterol (BREZTRI AEROSPHERE) 160-9-4.8 MCG/ACT AERO Inhale 2 puffs into the lungs in the morning and at bedtime. (Patient not taking: Reported on 01/21/2022)   [DISCONTINUED] citalopram (CELEXA) 20 MG tablet Take 1 tablet (20 mg total) by mouth every evening.   [DISCONTINUED] glipiZIDE (GLUCOTROL XL) 10 MG 24 hr tablet TAKE 1 TABLET BY MOUTH  DAILY   [DISCONTINUED] LORazepam (ATIVAN) 0.5 MG tablet Take 1 tablet (0.5 mg total) by mouth in the morning. As needed (Patient not taking: Reported on 01/21/2022)   [DISCONTINUED] metFORMIN (GLUCOPHAGE) 1000 MG tablet TAKE 1 TABLET BY MOUTH  TWICE DAILY   [DISCONTINUED] montelukast (SINGULAIR) 10 MG tablet TAKE 1 TABLET BY MOUTH  DAILY   Immunization History  Administered Date(s) Administered   Fluad Quad(high Dose 65+) 01/25/2019, 01/26/2020, 02/13/2021  Influenza, High Dose Seasonal PF 02/18/2015, 03/02/2016, 01/19/2017, 03/12/2018   Moderna SARS-COV2 Booster Vaccination 09/06/2019, 03/15/2020   Moderna Sars-Covid-2 Vaccination 05/31/2019, 06/28/2019   Pfizer Covid-19 Vaccine Bivalent Booster 55yrs & up 02/13/2021   Pneumococcal Conjugate-13 10/24/2013   Pneumococcal Polysaccharide-23 04/01/2005, 05/18/2005, 04/26/2012   Tdap 01/01/2012   Zoster, Live 01/01/2012      Objective:   Physical Exam BP 116/62 (BP Location: Left Arm, Cuff Size: Normal)   Pulse 76   Temp 97.7 F (36.5 C) (Temporal)   Ht 5\' 6"  (1.676 m)   Wt 158 lb 9.6 oz (71.9 kg)   SpO2 96%   BMI 25.60 kg/m   SpO2: 96 % O2 Device: None (Room air)  GENERAL: Well-developed, well-nourished elderly woman, presents in transport  chair due to dyspnea exertion, no conversational dyspnea.  HEAD: Normocephalic, atraumatic.  EYES: Pupils equal, round, reactive to light.  No scleral icterus.  MOUTH: Edentulous, oral mucosa moist, no thrush. NECK: Supple. No thyromegaly. Trachea midline. No JVD.  No adenopathy. PULMONARY: Good air entry bilaterally.  Coarse breath sounds throughout, adventitious sounds.  ABDOMEN: Benign. MUSCULOSKELETAL: No joint deformity, no clubbing, no edema.  NEUROLOGIC: No overt focal deficit SKIN: Intact,warm,dry.  Areas of prior melanoma excision in right forearm. PSYCH: Normal mood and behavior.      Assessment & Plan:     ICD-10-CM   1. Stage 3 severe COPD by GOLD classification (HCC)  J44.9 Pulse oximetry, overnight   Continue Breztri 2 puffs twice a day Continue as needed albuterol Overnight oximetry on room air    2. Dyspnea and respiratory abnormalities  R06.00    R06.89    Will check overnight oximetry Nocturnal hypoxemia due to COPD may account for for "grogginess"    3. Tobacco dependence due to cigarettes  F17.210    Patient counseled regards to discontinuation of smoking Total counseling time 3 to 5 minutes     Orders Placed This Encounter  Procedures   Pulse oximetry, overnight    On roomair  06-29-1976    Standing Status:   Future    Standing Expiration Date:   11/26/2022   Meds ordered this encounter  Medications   Budeson-Glycopyrrol-Formoterol (BREZTRI AEROSPHERE) 160-9-4.8 MCG/ACT AERO    Sig: Inhale 2 puffs into the lungs in the morning and at bedtime.    Dispense:  10.7 g    Refill:  0    Order Specific Question:   Lot Number?    Answer:   01/27/2023 [id number]    Order Specific Question:   Expiration Date?    Answer:   04/17/2024    Order Specific Question:   Manufacturer?    Answer:   [employer] [[id number]]    Order Specific Question:   Quantity    Answer:   2   We will see the patient in follow-up in 4 months time she is to contact 14/05/2023 prior to that time should any  new difficulties arise.  [place], MD Advanced Bronchoscopy P[facility]    *This note was dictated using voice recognition software/Dragon.  Despite best efforts to proofread, errors can occur which can change the meaning. Any transcriptional errors that result from this process are unintentional and may not be fully corrected at the time of dictation.

## 2021-12-01 DIAGNOSIS — G4730 Sleep apnea, unspecified: Secondary | ICD-10-CM | POA: Diagnosis not present

## 2021-12-01 DIAGNOSIS — R0683 Snoring: Secondary | ICD-10-CM | POA: Diagnosis not present

## 2021-12-03 ENCOUNTER — Other Ambulatory Visit: Payer: Self-pay | Admitting: Family Medicine

## 2021-12-03 NOTE — Telephone Encounter (Signed)
Medication Refill - Medication: glipiZIDE (GLUCOTROL XL) 10 MG 24 hr tablet  Has the patient contacted their pharmacy? Yes.   Pt told pharmacy does not have Rx so pt request Rx to be sent to another pharmacy  Preferred Pharmacy (with phone number or street name):  [place], [place] Phone:  [phone]  Fax:  [phone]     Has the patient been seen for an appointment in the last year OR does the patient have an upcoming appointment? Yes.    Agent: Please be advised that RX refills may take up to 3 business days. We ask that you follow-up with your pharmacy.

## 2021-12-04 MED ORDER — GLIPIZIDE ER 10 MG PO TB24: 10.0000 mg | ORAL_TABLET | Freq: Every day | ORAL | 0 refills | Status: DC

## 2021-12-04 NOTE — Telephone Encounter (Signed)
Requested Prescriptions  Pending Prescriptions Disp Refills  . glipiZIDE (GLUCOTROL XL) 10 MG 24 hr tablet 90 tablet 1    Sig: Take 1 tablet (10 mg total) by mouth daily.     Endocrinology:  Diabetes - Sulfonylureas Failed - 12/03/2021  3:28 PM      Failed - Cr in normal range and within 360 days    Creatinine, Ser  Date Value Ref Range Status  01/31/2021 1.21 (H) 0.57 - 1.00 mg/dL Final   Creatinine, POC  Date Value Ref Range Status  07/13/2016 n/a mg/dL Final         Passed - HBA1C is between 0 and 7.9 and within 180 days    Hemoglobin A1C  Date Value Ref Range Status  10/07/2021 7.4 (A) 4.0 - 5.6 % Final   Hgb A1c MFr Bld  Date Value Ref Range Status  06/04/2021 7.7 (H) 4.8 - 5.6 % Final    Comment:             Prediabetes: 5.7 - 6.4          Diabetes: >6.4          Glycemic control for adults with diabetes: <7.0          Passed - Valid encounter within last 6 months    Recent Outpatient Visits          1 month ago Type 2 diabetes mellitus with diabetic neuropathy, without long-term current use of insulin [facility])   [facility] Birdie Sons, MD   3 months ago Cough   [facility] Birdie Sons, MD   5 months ago Malignant melanoma of right forearm [facility])   [facility] Birdie Sons, MD   6 months ago Iron deficiency anemia, unspecified iron deficiency anemia type   [facility] Birdie Sons, MD   7 months ago 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 Birdie Sons, MD      Future Appointments            In 2 months Fisher, Kirstie Peri, MD [facility], [place]   In 2 months Ralene Bathe, MD [place]

## 2021-12-19 ENCOUNTER — Telehealth: Payer: Self-pay

## 2021-12-19 DIAGNOSIS — J449 Chronic obstructive pulmonary disease, unspecified: Secondary | ICD-10-CM

## 2021-12-19 NOTE — Telephone Encounter (Signed)
ONO reviewed by Dr. Dietrich Pates 2L QHS.   Spoke to patient and relayed above results. She stated that she would like to discuss results with her husband and call back with update.   Will await call back.

## 2021-12-23 NOTE — Telephone Encounter (Signed)
Spoke to patient. She would like to proceed with nocturnal oxygen,  Order has been placed. Nothing further needed.

## 2021-12-24 DIAGNOSIS — J449 Chronic obstructive pulmonary disease, unspecified: Secondary | ICD-10-CM | POA: Diagnosis not present

## 2021-12-26 ENCOUNTER — Ambulatory Visit
Admission: RE | Admit: 2021-12-26 | Discharge: 2021-12-26 | Disposition: A | Discharge status: Home / Self Care | Payer: Medicare Other | Source: Ambulatory Visit | Attending: Family Medicine | Admitting: Family Medicine

## 2021-12-26 DIAGNOSIS — Z1231 Encounter for screening mammogram for malignant neoplasm of breast: Secondary | ICD-10-CM | POA: Insufficient documentation

## 2022-01-08 ENCOUNTER — Ambulatory Visit: Payer: Self-pay | Admitting: *Deleted

## 2022-01-08 NOTE — Patient Outreach (Signed)
  Care Coordination   Initial Visit Note   01/08/2022 Name: Erica Rivas MRN: [id number] DOB: November 09, 1940  Erica Rivas is a 81 y.o. year old female who sees Fisher, Kirstie Peri, MD for primary care. I spoke with  [place] by phone today  What matters to the patients health and wellness today?  Patient verbalized having no nursing or community resource needs at this time.    Goals Addressed             This Visit's Progress    cafre coordination activities       Care Coordination Interventions: Case Management activities offered/discussed SDOH Screening completed PCP appointment confirmed 02/03/22 Encouraged patient to schedule her AWV Patient verbalized having no nursing or community resource needs at this time         SDOH assessments and interventions completed:  Yes     Care Coordination Interventions Activated:  Yes  Care Coordination Interventions:  Yes, provided   Follow up plan: No further intervention required.   Encounter Outcome:  Pt. Visit Completed

## 2022-01-08 NOTE — Patient Instructions (Signed)
Visit Information  Thank you for taking time to visit with me today. Please don't hesitate to contact me if I can be of assistance to you.   Following are the goals we discussed today:   Goals Addressed             This Visit's Progress    cafre coordination activities       Care Coordination Interventions: Case Management activities offered/discussed SDOH Screening completed PCP appointment confirmed 02/03/22 Encouraged patient to schedule her AWV Patient verbalized having no nursing or community resource needs at this time          Please call the care guide team at [phone] if you need schedule an  appointment with your care management team  If you are experiencing a Mental Health or [place] or need someone to talk to, please call the Suicide and Crisis Lifeline: 988   Patient verbalizes understanding of instructions and care plan provided today and agrees to view in [place]. Active MyChart status and patient understanding of how to access instructions and care plan via MyChart confirmed with patient.     No further follow up required:     Elliot Gurney, [place] Worker  [facility] Care Management [phone]

## 2022-01-12 ENCOUNTER — Encounter (INDEPENDENT_AMBULATORY_CARE_PROVIDER_SITE_OTHER): Payer: Medicare Other | Admitting: Ophthalmology

## 2022-01-14 ENCOUNTER — Encounter: Payer: Self-pay | Admitting: Family Medicine

## 2022-01-14 DIAGNOSIS — R159 Full incontinence of feces: Secondary | ICD-10-CM

## 2022-01-14 NOTE — Telephone Encounter (Signed)
What is the medical reason for her referral request?

## 2022-01-21 ENCOUNTER — Ambulatory Visit: Payer: Medicare Other | Admitting: Gastroenterology

## 2022-01-21 ENCOUNTER — Other Ambulatory Visit: Payer: Self-pay

## 2022-01-21 ENCOUNTER — Encounter: Payer: Self-pay | Admitting: Gastroenterology

## 2022-01-21 VITALS — BP 133/69 | HR 69 | Temp 98.4°F | Ht 66.0 in | Wt 158.1 lb

## 2022-01-21 DIAGNOSIS — K529 Noninfective gastroenteritis and colitis, unspecified: Secondary | ICD-10-CM | POA: Diagnosis not present

## 2022-01-21 DIAGNOSIS — D509 Iron deficiency anemia, unspecified: Secondary | ICD-10-CM

## 2022-01-21 MED ORDER — POLYETHYLENE GLYCOL 3350 17 GM/SCOOP PO POWD: 1.0000 | Freq: Every day | ORAL | 0 refills | Status: DC

## 2022-01-21 MED ORDER — NA SULFATE-K SULFATE-MG SULF 17.5-3.13-1.6 GM/177ML PO SOLN: 354.0000 mL | Freq: Once | ORAL | 0 refills | Status: AC

## 2022-01-21 NOTE — Progress Notes (Signed)
[name], MD [address]  [place], [place] [address]  Main: [phone]  Fax: [phone]    Gastroenterology Consultation  Referring Provider:     Birdie Sons, MD Primary Care Physician:  Birdie Sons, MD Primary Gastroenterologist:  Dr. [name] Reason for Consultation: Chronic iron deficiency anemia, chronic diarrhea and abdominal bloating        HPI:   Erica Rivas is a 81 y.o. female referred by Dr. Birdie Sons, MD  for consultation & management of chronic diarrhea and abdominal bloating.  Patient reports more than 1 year history of progressively worsening nonbloody diarrhea, postprandial, 5-6 times daily associated with significant abdominal bloating.  She takes Imodium as needed which makes her bloating worse and feels constipated.  She missed going to church on Sundays due to her GI symptoms.  She denies any nocturnal diarrhea, did have episodes of fecal incontinence.  She denies any rectal bleeding, weight loss or loss of appetite.  Patient also has history of chronic iron deficiency anemia, underwent EGD and colonoscopy in 2019 which were unremarkable.  At that time, I recommended we do capsule endoscopy and she deferred.  She did not follow-up with me again.  Her recent labs from January 2023 revealed persistent severe iron deficiency anemia, she also has mild B12 deficiency.  Hemoglobin 9.5, MCV 82, serum ferritin 14. Patient does state that she eats fatty foods on a regular basis, drinks [facility], coffee with creamer and sugar daily.  Patient is accompanied by her husband.  She loves eating red meat as well  She has history of chronic tobacco use, 120 pack years of smoking history, smokes half pack per day currently  NSAIDs: None  Antiplts/Anticoagulants/Anti thrombotics: None  GI Procedures:  EGD and colonoscopy 01/18/2018 - Normal duodenal bulb and second portion of the duodenum. Biopsied. - Normal stomach. Biopsied. -  1 cm hiatal hernia. - Normal gastroesophageal junction and esophagus.  - The examined portion of the ileum was normal. - The entire examined colon is normal. - Non-bleeding external hemorrhoids. - No specimens collected.  DIAGNOSIS:  A. DUODENUM; COLD BIOPSY:  - MILD CHRONIC DUODENITIS, NON-SPECIFIC.  - NEGATIVE FOR FEATURES OF CELIAC SPRUE, DYSPLASIA, AND MALIGNANCY.   B.  STOMACH; COLD BIOPSY:  - ANTRAL MUCOSA WITH MILD REACTIVE GASTRITIS AND FOCAL INTESTINAL  METAPLASIA.  - OXYNTIC MUCOSA WITH MILD CHRONIC NON-SPECIFIC GASTRITIS.  - NEGATIVE FOR H. PYLORI, DYSPLASIA, AND MALIGNANCY.   Past Medical History:  Diagnosis Date   Actinic keratosis    Anemia    Colon polyps    Depressive disorder 07/08/2007   Hemorrhoids    History of chicken pox    History of measles    History of mumps    Lung nodule 02/18/2015   No additional follow up needed per CT report 03/05/2015    Melanoma ([place]) 08/14/2020   right forearm, excised 09/17/2020 [facility]   SCC (squamous cell carcinoma), hand    excision SCC left forearm and left hand 09/05/21 Dr. 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   L pretibial - ED&C    Squamous cell carcinoma of skin 05/23/2019   L calf - ED&C     Past Surgical History:  Procedure Laterality Date   ABDOMINAL HYSTERECTOMY  1985   DUB; ovaries resected/ removed   BREAST BIOPSY Right 11/12/2014   fibroadenomatous   CARDIOVASCULAR STRESS TEST  02/2008   low risk  CAROTID PTA/STENT INTERVENTION Right 11/13/2019   Procedure: CAROTID PTA/STENT INTERVENTION;  Surgeon: Algernon Huxley, MD;  Location: [facility];  Service: Cardiovascular;  Laterality: Right;   COLONOSCOPY WITH PROPOFOL N/A 01/18/2018   Procedure: COLONOSCOPY WITH PROPOFOL;  Surgeon: Lin Landsman, MD;  Location: [facility] END[facility]COPY;  Service: Gastroenterology;  Laterality: N/A;   DOPPLER ECHOCARDIOGRAPHY  01/2010   EF>55%, LV relaxation impairment consistent with diastolic  dysfunction, mild TR and MR   ESOPHAGOGASTRODUODENOSCOPY  07/2009   +diffuse gastritis. Iftikhar   ESOPHAGOGASTRODUODENOSCOPY (EGD) WITH PROPOFOL N/A 01/18/2018   Procedure: ESOPHAGOGASTRODUODENOSCOPY (EGD) WITH PROPOFOL;  Surgeon: Lin Landsman, MD;  Location: [facility] ENDOSCOPY;  Service: Gastroenterology;  Laterality: N/A;   Irena   HAND SURGERY Right 2010   surgery x 2. Dr. Sabra Heck   MELANOMA EXCISION Right 09/17/2020   Wide excision r forearm Dr. Arrie Aran, [facility]   NECK SURGERY  1998   Disc surgey on neck   Sinus Surgery     TUBAL LIGATION  1971     Current Outpatient Medications:    albuterol (PROVENTIL) (2.5 MG/3ML) 0.083% nebulizer solution, Take 3 mLs (2.5 mg total) by nebulization 4 (four) times daily as needed for wheezing or shortness of breath (as needed up to 4 times per day.)., Disp: 120 mL, Rfl: 3   albuterol (VENTOLIN HFA) 108 (90 Base) MCG/ACT inhaler, INHALE 2 PUFFS BY MOUTH EVERY 6 HOURS AS NEEDED FOR WHEEZING AND FOR SHORTNESS OF BREATH, Disp: 9 g, Rfl: 5   amLODipine (NORVASC) 5 MG tablet, TAKE 1 TABLET BY MOUTH  DAILY, Disp: 90 tablet, Rfl: 3   aspirin EC 81 MG tablet, Take 81 mg by mouth daily., Disp: , Rfl:    atenolol (TENORMIN) 50 MG tablet, TAKE 1 TABLET BY MOUTH  TWICE DAILY, Disp: 180 tablet, Rfl: 3   atorvastatin (LIPITOR) 80 MG tablet, TAKE 1 TABLET BY MOUTH  DAILY, Disp: 90 tablet, Rfl: 3   Budeson-Glycopyrrol-Formoterol (BREZTRI AEROSPHERE) 160-9-4.8 MCG/ACT AERO, Inhale 2 puffs into the lungs in the morning and at bedtime., Disp: 10.7 g, Rfl: 0   Cholecalciferol (VITAMIN D) 2000 UNITS tablet, Take 1,000 Units by mouth daily. , Disp: , Rfl:    citalopram (CELEXA) 20 MG tablet, Take 1 tablet (20 mg total) by mouth every evening., Disp: 90 tablet, Rfl: 1   furosemide (LASIX) 20 MG tablet, Take 1 tablet (20 mg total) by mouth daily as needed (swelling)., Disp: 20 tablet, Rfl: 4   glipiZIDE (GLUCOTROL XL) 10 MG 24 hr  tablet, Take 1 tablet (10 mg total) by mouth daily., Disp: 90 tablet, Rfl: 0   glucose blood (ONETOUCH ULTRA) test strip, USE 1 STRIP TO CHECK GLUCOSE ONCE DAILY, Disp: 50 each, Rfl: 4   hydrOXYzine (ATARAX) 25 MG tablet, Take 12.5 mg by mouth at bedtime., Disp: , Rfl:    lisinopril-hydrochlorothiazide (ZESTORETIC) 20-25 MG tablet, TAKE 1 TABLET BY MOUTH  DAILY, Disp: 90 tablet, Rfl: 3   LORazepam (ATIVAN) 1 MG tablet, Take 1 mg by mouth daily as needed., Disp: , Rfl:    metFORMIN (GLUCOPHAGE) 1000 MG tablet, TAKE 1 TABLET BY MOUTH  TWICE DAILY, Disp: 180 tablet, Rfl: 3   montelukast (SINGULAIR) 10 MG tablet, TAKE 1 TABLET BY MOUTH  DAILY, Disp: 90 tablet, Rfl: 3   Na Sulfate-K Sulfate-Mg Sulf 17.5-3.13-1.6 GM/177ML SOLN, Take 354 mLs by mouth once for 1 dose., Disp: 354 mL, Rfl: 0   omeprazole (PRILOSEC) 40 MG capsule, Take  40 mg by mouth daily., Disp: , Rfl:    OneTouch Delica Lancets 57D MISC, USE TO CHECK BLOOD SUGAR DAILY, Disp: 100 each, Rfl: 3   valACYclovir (VALTREX) 1000 MG tablet, 2 tablets twice a day for 1 day as needed for cold sores, Disp: 12 tablet, Rfl: 2   Family History  Problem Relation Age of Onset   Hypertension Sister    COPD Sister    Thyroid disease Sister    Breast cancer Sister 27   Thyroid disease Sister    Heart attack Mother    Hypertension Mother    Thyroid disease Mother        HYPERTHYROIDISM   GI Bleed Father    Lung cancer Brother    Breast cancer Sister    Hypertension Sister    Aneurysm Sister        in the back of her neck   Heart attack Brother        CABG     Social History   Tobacco Use   Smoking status: Every Day    Packs/day: 2.00    Years: 60.00    Total pack years: 120.00    Types: Cigarettes   Smokeless tobacco: Never   Tobacco comments:    7-8 cigarettes daily-11/25/2021  Vaping Use   Vaping Use: Never used  Substance Use Topics   Alcohol use: No    Alcohol/week: 0.0 standard drinks of alcohol   Drug use: No     Allergies as of 01/21/2022 - Review Complete 01/21/2022  Allergen Reaction Noted   Bupropion Other (See Comments) 04/22/2021    Review of Systems:    All systems reviewed and negative except where noted in HPI.   Physical Exam:  BP 133/69 (BP Location: Left Arm, Patient Position: Sitting, Cuff Size: Normal)   Pulse 69   Temp 98.4 F (36.9 C) (Oral)   Ht 5\' 6"  (1.676 m)   Wt 158 lb 2 oz (71.7 kg)   BMI 25.52 kg/m  No LMP recorded. Patient has had a hysterectomy.  General:   Alert,  Well-developed, well-nourished, pleasant and cooperative in NAD Head:  Normocephalic and atraumatic. Eyes:  Sclera clear, no icterus.   Conjunctiva pink. Ears:  Normal auditory acuity. Nose:  No deformity, discharge, or lesions. Mouth:  No deformity or lesions,oropharynx pink & moist. Neck:  Supple; no masses or thyromegaly. Lungs:  Respirations even and unlabored.  Clear throughout to auscultation.   No wheezes, crackles, or rhonchi. No acute distress. Heart:  Regular rate and rhythm; no murmurs, clicks, rubs, or gallops. Abdomen:  Normal bowel sounds. Soft, non-tender and grossly distended, tympanic to percussion without masses, hepatosplenomegaly or hernias noted.  No guarding or rebound tenderness.   Rectal: Not performed Msk:  Symmetrical without gross deformities. Good, equal movement & strength bilaterally. Pulses:  Normal pulses noted. Extremities:  No clubbing or edema.  No cyanosis. Neurologic:  Alert and oriented x3;  grossly normal neurologically. Skin:  Intact without significant lesions or rashes. No jaundice. Psych:  Alert and cooperative. Normal mood and affect.  Imaging Studies: Reviewed  Assessment and Plan:   Erica Rivas is a 81 y.o. female with history of chronic tobacco use, history of melanoma, chronic iron deficiency anemia of unclear etiology is seen in consultation for chronic unexplained diarrhea and abdominal bloating  With history of heavy tobacco use, concern  for exocrine pancreatic insufficiency Check pancreatic fecal elastase levels GI profile PCR to rule out any chronic infection Discussed  about upper endoscopy and colonoscopy with random colon biopsies for work-up of chronic diarrhea Advised patient regarding lactose-free diet, cut back on carbonated beverages, artificial sweeteners, greasy foods  Chronic unexplained iron deficiency anemia EGD and colonoscopy 2019 were unremarkable Recommend repeat EGD and colonoscopy and possible video capsule endoscopy Check intrinsic factor and parietal cell antibodies Recheck CBC, iron panel, B12 and folate levels Trial of fusion plus, samples provided   Follow up in 3 months   [name], MD

## 2022-01-21 NOTE — Patient Instructions (Signed)
Gave Fusion Samples please let us know how you tolerate it and we can call you in a prescription  Lactose-Free Diet, Adult If you have lactose intolerance, you are not able to digest lactose. Lactose is a natural sugar found mainly in dairy milk and dairy products. A lactose-free diet can help you avoid foods and beverages that contain lactose. What are tips for following this plan? Reading food labels Do not consume foods, beverages, vitamins, minerals, or medicines containing lactose. Read ingredient lists carefully. Look for the words "lactose-free" on labels. Meal planning Use alternatives to dairy milk and foods made with milk products. These include the following: Lactose-free milk. Soy milk with added calcium and vitamin D. Almond milk, coconut milk, rice milk, or other nondairy milk alternatives with added calcium and vitamin D. Note that a lot of these are low in protein. Soy products, such as soy yogurt, soy cheese, soy ice cream, and soy-based sour cream. Other nut milk products, such as almond yogurt, almond cheese, cashew yogurt, cashew cheese, cashew ice cream, coconut yogurt, and coconut ice cream. Medicines, vitamins, and supplements Use lactase enzyme drops or tablets as directed by your health care provider. Make sure you get enough calcium and vitamin D in your diet. A lactose-free eating plan can be lacking in these important nutrients. Take calcium and vitamin D supplements as directed by your health care provider. Talk with your health care provider about supplements if you are not able to get enough calcium and vitamin D from food. What foods should I eat?  Fruits All fresh, canned, frozen, or dried fruits and fruit juices that are not processed with lactose. Vegetables All fresh, frozen, and canned vegetables without cheese, cream, or butter sauces. Grains Any that are not made with dairy milk or dairy products. Meats and other proteins Any meat, fish, poultry, and  other protein sources that are not made with dairy milk or dairy products. Fats and oils Any that are not made with dairy milk or dairy products. Sweets and desserts Any that are not made with dairy milk or dairy products. Seasonings and condiments Any that are not made with dairy milk or dairy products. Calcium Calcium is found in many foods that contain lactose and is important for bone health. The amount of calcium you need depends on your age: Adults younger than 50 years: 1,000 mg of calcium a day. Adults older than 50 years: 1,200 mg of calcium a day. If you are not getting enough calcium, you may get it from other sources, including: Orange juice that has been fortified with calcium. This means that calcium has been added to the product. There are 300-350 mg of calcium in 1 cup (237 mL) of calcium-fortified orange juice. Soy milk fortified with calcium. There are 300-400 mg of calcium in 1 cup (237 mL) of calcium-fortified soy milk. Rice or almond milk fortified with calcium. There are 300 mg of calcium in 1 cup (237 mL) of calcium-fortified rice or almond milk. Breakfast cereals fortified with calcium. There are 100-1,000 mg of calcium in calcium-fortified breakfast cereals. Spinach, cooked. There are 145 mg of calcium in  cup (90 g) of cooked spinach. Edamame, cooked. There are 130 mg of calcium in  cup (47 g) of cooked edamame. Collard greens, cooked. There are 125 mg of calcium in  cup (85 g) of cooked collard greens. Kale, frozen or cooked. There are 90 mg of calcium in  cup (59 g) of cooked or frozen kale. Almonds. There  are 95 mg of calcium in  cup (35 g) of almonds. Broccoli, cooked. There are 60 mg of calcium in 1 cup (156 g) of cooked broccoli. The items listed above may not be a complete list of foods and beverages you can eat. Contact a dietitian for more options. What foods should I avoid? Lactose is found in dairy milk and dairy products, such  as: Yogurt. Cheese. Butter. Margarine. Sour cream. Cream. Whipped toppings and creamers. Ice cream and other dairy-based desserts. Lactose is also found in foods or products made with dairy milk or milk ingredients. To find out whether a food contains dairy milk or a milk ingredient, look at the ingredients list. Avoid foods with the statement "May contain milk" and foods that contain: Milk powder. Whey. Curd. Lactose. Lactoglobulin. The items listed above may not be a complete list of foods and beverages to avoid. Contact a dietitian for more information. Where to find more information [employer] of Diabetes and Digestive and Kidney Diseases: [email or web address] Summary If you are lactose intolerant, it means that you are not able to digest lactose, a natural sugar found in milk and milk products. Following a lactose-free diet can help you manage this condition. Calcium is important for bone health and is found in many foods that contain lactose. Talk with your health care provider about other sources of calcium. This information is not intended to replace advice given to you by your health care provider. Make sure you discuss any questions you have with your health care provider. Document Revised: 04/09/2020 Document Reviewed: 04/09/2020 Elsevier Patient Education  [place].

## 2022-01-23 DIAGNOSIS — K529 Noninfective gastroenteritis and colitis, unspecified: Secondary | ICD-10-CM | POA: Diagnosis not present

## 2022-01-23 DIAGNOSIS — D509 Iron deficiency anemia, unspecified: Secondary | ICD-10-CM | POA: Diagnosis not present

## 2022-01-24 DIAGNOSIS — J449 Chronic obstructive pulmonary disease, unspecified: Secondary | ICD-10-CM | POA: Diagnosis not present

## 2022-01-25 LAB — IRON,TIBC AND FERRITIN PANEL
Ferritin: 19 ng/mL (ref 15–150)
Iron Saturation: 11 % — ABNORMAL LOW (ref 15–55)
Iron: 45 ug/dL (ref 27–139)
Total Iron Binding Capacity: 397 ug/dL (ref 250–450)
UIBC: 352 ug/dL (ref 118–369)

## 2022-01-25 LAB — B12 AND FOLATE PANEL
Folate: 9.6 ng/mL (ref >3.0)
Vitamin B-12: 216 pg/mL — ABNORMAL LOW (ref 232–1245)

## 2022-01-25 LAB — CBC
Hematocrit: 28.9 % — ABNORMAL LOW (ref 34.0–46.6)
Hemoglobin: 9.4 g/dL — ABNORMAL LOW (ref 11.1–15.9)
MCH: 26.9 pg (ref 26.6–33.0)
MCHC: 32.5 g/dL (ref 31.5–35.7)
MCV: 83 fL (ref 79–97)
Platelets: 259 10*3/uL (ref 150–450)
RBC: 3.49 x10E6/uL — ABNORMAL LOW (ref 3.77–5.28)
RDW: 14.4 % (ref 11.7–15.4)
WBC: 10.8 10*3/uL (ref 3.4–10.8)

## 2022-01-25 LAB — ANTI-PARIETAL ANTIBODY: Parietal Cell Ab: 1.1 Units (ref 0.0–20.0)

## 2022-01-25 LAB — INTRINSIC FACTOR ANTIBODIES: Intrinsic Factor Abs, Serum: 1 AU/mL (ref 0.0–1.1)

## 2022-01-27 ENCOUNTER — Telehealth: Payer: Self-pay

## 2022-01-27 NOTE — Telephone Encounter (Signed)
Called and left a message for call back  

## 2022-01-27 NOTE — Telephone Encounter (Signed)
-----   Message from Lin Landsman, MD sent at 01/27/2022  4:31 PM EDT ----- Patient has severe iron and B12 deficiency anemia.  Recommended to start fusion plus1 pill a day and B12 1000 mcg 1 pill a day.    Her stool studies also came back positive for E. coli and norovirus which explains recent worsening of diarrhea.  Recommend azithromycin 500 mg once a day for 3 days.  RV

## 2022-01-28 MED ORDER — VITAMIN B-12 1000 MCG PO TABS: 1000.0000 ug | ORAL_TABLET | Freq: Every day | ORAL | 5 refills | Status: DC

## 2022-01-28 MED ORDER — AZITHROMYCIN 500 MG PO TABS: 500.0000 mg | ORAL_TABLET | Freq: Every day | ORAL | 0 refills | Status: DC

## 2022-01-28 MED ORDER — FUSION PLUS PO CAPS: 1.0000 | ORAL_CAPSULE | Freq: Every day | ORAL | 5 refills | Status: DC

## 2022-01-28 NOTE — Telephone Encounter (Signed)
Patient verbalized understanding of results. Sent medications to the pharmacy

## 2022-01-29 LAB — GI PROFILE, STOOL, PCR
Adenovirus F 40/41: NOT DETECTED
Astrovirus: NOT DETECTED
C difficile toxin A/B: NOT DETECTED
Campylobacter: NOT DETECTED
Cryptosporidium: NOT DETECTED
Cyclospora cayetanensis: NOT DETECTED
Entamoeba histolytica: NOT DETECTED
Enteroaggregative E coli: NOT DETECTED
Enteropathogenic E coli: DETECTED — AB
Enterotoxigenic E coli: NOT DETECTED
Giardia lamblia: NOT DETECTED
Norovirus GI/GII: DETECTED — AB
Plesiomonas shigelloides: NOT DETECTED
Rotavirus A: NOT DETECTED
Salmonella: NOT DETECTED
Sapovirus: NOT DETECTED
Shiga-toxin-producing E coli: NOT DETECTED
Shigella/Enteroinvasive E coli: NOT DETECTED
Vibrio cholerae: NOT DETECTED
Vibrio: NOT DETECTED
Yersinia enterocolitica: NOT DETECTED

## 2022-01-29 LAB — PANCREATIC ELASTASE, FECAL: Pancreatic Elastase, Fecal: 102 ug Elast./g — ABNORMAL LOW (ref >200)

## 2022-02-01 ENCOUNTER — Other Ambulatory Visit: Payer: Self-pay | Admitting: Family Medicine

## 2022-02-01 DIAGNOSIS — J301 Allergic rhinitis due to pollen: Secondary | ICD-10-CM

## 2022-02-03 ENCOUNTER — Ambulatory Visit (INDEPENDENT_AMBULATORY_CARE_PROVIDER_SITE_OTHER): Payer: Medicare Other | Admitting: Family Medicine

## 2022-02-03 ENCOUNTER — Encounter: Payer: Self-pay | Admitting: Family Medicine

## 2022-02-03 VITALS — BP 129/52 | HR 63 | Temp 98.3°F | Resp 20 | Wt 157.0 lb

## 2022-02-03 DIAGNOSIS — E1140 Type 2 diabetes mellitus with diabetic neuropathy, unspecified: Secondary | ICD-10-CM

## 2022-02-03 DIAGNOSIS — D509 Iron deficiency anemia, unspecified: Secondary | ICD-10-CM

## 2022-02-03 DIAGNOSIS — R5383 Other fatigue: Secondary | ICD-10-CM

## 2022-02-03 DIAGNOSIS — F331 Major depressive disorder, recurrent, moderate: Secondary | ICD-10-CM

## 2022-02-03 DIAGNOSIS — I700 Atherosclerosis of aorta: Secondary | ICD-10-CM | POA: Diagnosis not present

## 2022-02-03 LAB — POCT GLYCOSYLATED HEMOGLOBIN (HGB A1C)
Est. average glucose Bld gHb Est-mCnc: 148
Hemoglobin A1C: 6.8 % — AB (ref 4.0–5.6)

## 2022-02-03 MED ORDER — BUPROPION HCL ER (XL) 150 MG PO TB24: 150.0000 mg | ORAL_TABLET | Freq: Every day | ORAL | 1 refills | Status: DC

## 2022-02-03 NOTE — Patient Instructions (Signed)
.   Please review the attached list of medications and notify my office if there are any errors.   . Please bring all of your medications to every appointment so we can make sure that our medication list is the same as yours.   

## 2022-02-03 NOTE — Progress Notes (Signed)
I,Erica Rivas,acting as a scribe for Erica Huh, MD.,have documented all relevant documentation on the behalf of Erica Huh, MD,as directed by  Erica Huh, MD while in the presence of Erica Huh, MD.    Established patient visit   Patient: Erica Rivas   DOB: [birth year]   81 y.o. Female  MRN: [id number] Visit Date: 02/03/2022  Today's healthcare provider: Lelon Huh, MD   Chief Complaint  Patient presents with   Diabetes   Stress   Subjective    HPI  Diabetes Mellitus Type II, Follow-up  Lab Results  Component Value Date   HGBA1C 7.4 (A) 10/07/2021   HGBA1C 7.7 (H) 06/04/2021   HGBA1C 7.1 (A) 03/04/2021   Wt Readings from Last 3 Encounters:  02/03/22 157 lb (71.2 kg)  01/21/22 158 lb 2 oz (71.7 kg)  11/25/21 158 lb 9.6 oz (71.9 kg)   Last seen for diabetes 3 months ago.  Management since then includes continue same medication. She reports good compliance with treatment. She is not having side effects.  Symptoms: No fatigue No foot ulcerations  No appetite changes No nausea  No paresthesia of the feet  No polydipsia  No polyuria No visual disturbances   No vomiting     Home blood sugar records: fasting range: 90-120  Episodes of hypoglycemia? No    Current insulin regiment: none Most Recent Eye Exam: 06/23/2021 Current exercise: weightlifting and walking in place Current diet habits: in general, a "healthy" diet    Pertinent Labs: Lab Results  Component Value Date   CHOL 140 08/08/2020   HDL 56 08/08/2020   LDLCALC 67 08/08/2020   TRIG 90 08/08/2020   CHOLHDL 2.5 08/08/2020   Lab Results  Component Value Date   NA 138 01/31/2021   K 4.6 01/31/2021   CREATININE 1.21 (H) 01/31/2021   EGFR 46 (L) 01/31/2021   MICROALBUR 20 07/13/2016   LABMICR 12.8 10/07/2021     ---------------------------------------------------------------------------------------------------   Follow up for situational stress:  The patient was last  seen for this 3 months ago. Changes made at last visit include  increasing to citalopram (CELEXA) 10 MG tablet; Take 2 tablets (20 mg total) by mouth every evening.   She reports good compliance with treatment. Patient states she takes this medications to help with sleeping. She feels that condition is Improved.  She is not having side effects.   -----------------------------------------------------------------------------------------   Medications: Outpatient Medications Prior to Visit  Medication Sig   albuterol (PROVENTIL) (2.5 MG/3ML) 0.083% nebulizer solution Take 3 mLs (2.5 mg total) by nebulization 4 (four) times daily as needed for wheezing or shortness of breath (as needed up to 4 times per day.).   albuterol (VENTOLIN HFA) 108 (90 Base) MCG/ACT inhaler INHALE 2 PUFFS BY MOUTH EVERY 6 HOURS AS NEEDED FOR WHEEZING AND FOR SHORTNESS OF BREATH   amLODipine (NORVASC) 5 MG tablet TAKE 1 TABLET BY MOUTH  DAILY   aspirin EC 81 MG tablet Take 81 mg by mouth daily.   atenolol (TENORMIN) 50 MG tablet TAKE 1 TABLET BY MOUTH  TWICE DAILY   atorvastatin (LIPITOR) 80 MG tablet TAKE 1 TABLET BY MOUTH  DAILY   azithromycin (ZITHROMAX) 500 MG tablet Take 1 tablet (500 mg total) by mouth daily.   Budeson-Glycopyrrol-Formoterol (BREZTRI AEROSPHERE) 160-9-4.8 MCG/ACT AERO Inhale 2 puffs into the lungs in the morning and at bedtime.   Cholecalciferol (VITAMIN D) 2000 UNITS tablet Take 1,000 Units by mouth daily.  citalopram (CELEXA) 20 MG tablet Take 1 tablet (20 mg total) by mouth every evening.   cyanocobalamin (VITAMIN B12) 1000 MCG tablet Take 1 tablet (1,000 mcg total) by mouth daily.   furosemide (LASIX) 20 MG tablet Take 1 tablet (20 mg total) by mouth daily as needed (swelling).   glipiZIDE (GLUCOTROL XL) 10 MG 24 hr tablet Take 1 tablet (10 mg total) by mouth daily.   glucose blood (ONETOUCH ULTRA) test strip USE 1 STRIP TO CHECK GLUCOSE ONCE DAILY   hydrOXYzine (ATARAX) 25 MG tablet Take  12.5 mg by mouth at bedtime.   Iron-FA-B Cmp-C-Biot-Probiotic (FUSION PLUS) CAPS Take 1 capsule by mouth daily.   lisinopril-hydrochlorothiazide (ZESTORETIC) 20-25 MG tablet TAKE 1 TABLET BY MOUTH  DAILY   LORazepam (ATIVAN) 1 MG tablet Take 1 mg by mouth daily as needed.   metFORMIN (GLUCOPHAGE) 1000 MG tablet TAKE 1 TABLET BY MOUTH  TWICE DAILY   montelukast (SINGULAIR) 10 MG tablet TAKE 1 TABLET BY MOUTH  DAILY   omeprazole (PRILOSEC) 40 MG capsule Take 40 mg by mouth daily.   OneTouch Delica Lancets 17E MISC USE TO CHECK BLOOD SUGAR DAILY   OXYGEN Inhale 2 L into the lungs at bedtime. Use at night   polyethylene glycol powder (GLYCOLAX/MIRALAX) 17 GM/SCOOP powder Take 255 g by mouth daily.   valACYclovir (VALTREX) 1000 MG tablet 2 tablets twice a day for 1 day as needed for cold sores   No facility-administered medications prior to visit.    Review of Systems  Constitutional:  Negative for appetite change, chills, fatigue and fever.  Respiratory:  Negative for chest tightness and shortness of breath.   Cardiovascular:  Negative for chest pain and palpitations.  Gastrointestinal:  Negative for abdominal pain, nausea and vomiting.  Neurological:  Negative for dizziness and weakness.       Objective    BP (!) 129/52 (BP Location: Left Arm, Patient Position: Sitting, Cuff Size: Large)   Pulse 63   Temp 98.3 F (36.8 C) (Oral)   Resp 20   Wt 157 lb (71.2 kg)   SpO2 98% Comment: room air  BMI 25.34 kg/m    Physical Exam  General appearance: Well developed, well nourished female, cooperative and in no acute distress Head: Normocephalic, without obvious abnormality, atraumatic Respiratory: Respirations even and unlabored, normal respiratory rate Extremities: All extremities are intact.  Skin: Skin color, texture, turgor normal. No rashes seen  Psych: Appropriate mood and affect. Neurologic: Mental status: Alert, oriented to person, place, and time, thought content appropriate.    Assessment & Plan     1. Type 2 diabetes mellitus with diabetic neuropathy, without long-term current use of insulin (HCC) Very well controlled. Continue current medications.    2. Atherosclerosis of aorta (HCC) Asymptomatic. Compliant with medication.  Continue aggressive risk factor modification.    3. Fatigue, unspecified type Multfactorial. Likely aggravated by iron deficiency anemia as below. Likely exacerbated by depression as below.   4. Iron deficiency anemia, unspecified iron deficiency anemia type On iron and currently followed by Dr. Marius Ditch.   5. Moderate episode of recurrent major depressive disorder (HCC) Citalopram has been somewhat helpful and feels like she is sleeping better, but has very poor energy during the day. Had brief trial of bupropion last year which she stopped since she felt fatigued while taking it, but fatigue was likely related to other conditions at that time. Will try bupropion (WELLBUTRIN XL) 150 MG 24 hr tablet; Take 1 tablet (150 mg total)  by mouth daily.  Reassess in a month. Consider increasing dose of trial of SNRI if not feeling better at follow up.        The entirety of the information documented in the History of Present Illness, Review of Systems and Physical Exam were personally obtained by me. Portions of this information were initially documented by the CMA and reviewed by me for thoroughness and accuracy.     Erica Huh, MD  [facility] [phone] (phone) [phone] (fax)  [place]

## 2022-02-12 ENCOUNTER — Telehealth: Payer: Self-pay | Admitting: *Deleted

## 2022-02-12 NOTE — Telephone Encounter (Signed)
Patient and husband wanted someone to go over the colonoscopy instructions. I had read off the instruction that was given to patient on 01/21/2022.

## 2022-02-13 ENCOUNTER — Ambulatory Visit: Payer: Medicare Other | Admitting: Pulmonary Disease

## 2022-02-17 ENCOUNTER — Ambulatory Visit: Payer: Medicare Other | Admitting: Dermatology

## 2022-02-17 DIAGNOSIS — Z85820 Personal history of malignant melanoma of skin: Secondary | ICD-10-CM

## 2022-02-17 DIAGNOSIS — C44622 Squamous cell carcinoma of skin of right upper limb, including shoulder: Secondary | ICD-10-CM | POA: Diagnosis not present

## 2022-02-17 DIAGNOSIS — L820 Inflamed seborrheic keratosis: Secondary | ICD-10-CM

## 2022-02-17 DIAGNOSIS — D492 Neoplasm of unspecified behavior of bone, soft tissue, and skin: Secondary | ICD-10-CM

## 2022-02-17 DIAGNOSIS — L570 Actinic keratosis: Secondary | ICD-10-CM

## 2022-02-17 DIAGNOSIS — L578 Other skin changes due to chronic exposure to nonionizing radiation: Secondary | ICD-10-CM

## 2022-02-17 NOTE — Progress Notes (Unsigned)
Follow-Up Visit   Subjective  Erica Rivas is a 81 y.o. female who presents for the following: check spots on face (Face, > 1 yr, scaly) and growth (R antecubital, 4 wks, painful). The patient has spots, moles and lesions to be evaluated, some may be new or changing and the patient has concerns that these could be cancer.  Patient accompanied by husband.  The following portions of the chart were reviewed this encounter and updated as appropriate:   Tobacco  Allergies  Meds  Problems  Med Hx  Surg Hx  Fam Hx     Review of Systems:  No other skin or systemic complaints except as noted in HPI or Assessment and Plan.  Objective  Well appearing patient in no apparent distress; mood and affect are within normal limits.  A focused examination was performed including face, right arm. Relevant physical exam findings are noted in the Assessment and Plan.  Right Antecubital Fossa Keratotic nodule 3.1cm, no lymphadenopathy     Right Forearm Keratotic nodule 0.8cm     R wrist Keratotic nodule 1.1cm     R cheek x 1 Stuck on waxy paps with erythema  face x 16 (16) Pink scaly macules   Assessment & Plan   Actinic Damage - chronic, secondary to cumulative UV radiation exposure/sun exposure over time - diffuse scaly erythematous macules with underlying dyspigmentation - Recommend daily broad spectrum sunscreen SPF 30+ to sun-exposed areas, reapply every 2 hours as needed.  - Recommend staying in the shade or wearing long sleeves, sun glasses (UVA+UVB protection) and wide brim hats (4-inch brim around the entire circumference of the hat). - Call for new or changing lesions.   History of Melanoma - No evidence of recurrence today - No lymphadenopathy - Recommend regular full body skin exams - Recommend daily broad spectrum sunscreen SPF 30+ to sun-exposed areas, reapply every 2 hours as needed.  - Call if any new or changing lesions are noted between office visits   - R forearm  Neoplasm of skin (3) Right Antecubital Fossa Epidermal / dermal shaving  Lesion diameter (cm):  3.1 Informed consent: discussed and consent obtained   Timeout: patient name, date of birth, surgical site, and procedure verified   Procedure prep:  Patient was prepped and draped in usual sterile fashion Prep type:  Isopropyl alcohol Anesthesia: the lesion was anesthetized in a standard fashion   Anesthetic:  1% lidocaine w/ epinephrine 1-100,000 buffered w/ 8.4% NaHCO3 Instrument used: flexible razor blade   Hemostasis achieved with: pressure, aluminum chloride and electrodesiccation   Outcome: patient tolerated procedure well   Post-procedure details: sterile dressing applied and wound care instructions given   Dressing type: bandage and bacitracin    Destruction of lesion Complexity: extensive   Destruction method: electrodesiccation and curettage   Informed consent: discussed and consent obtained   Timeout:  patient name, date of birth, surgical site, and procedure verified Procedure prep:  Patient was prepped and draped in usual sterile fashion Prep type:  Isopropyl alcohol Anesthesia: the lesion was anesthetized in a standard fashion   Anesthetic:  1% lidocaine w/ epinephrine 1-100,000 buffered w/ 8.4% NaHCO3 Curettage performed in three different directions: Yes   Electrodesiccation performed over the curetted area: Yes   Lesion length (cm):  3.1 Lesion width (cm):  3.1 Margin per side (cm):  0.2 Final wound size (cm):  3.5 Hemostasis achieved with:  pressure, aluminum chloride and electrodesiccation Outcome: patient tolerated procedure well with no complications  Post-procedure details: sterile dressing applied and wound care instructions given   Dressing type: bandage and bacitracin    Specimen 1 - Surgical pathology Differential Diagnosis: D48.5 R/O SCC  Check Margins: No Keratotic nodule 3.1cm EDC today  Right Forearm Epidermal / dermal  shaving  Lesion diameter (cm):  0.8 Informed consent: discussed and consent obtained   Timeout: patient name, date of birth, surgical site, and procedure verified   Procedure prep:  Patient was prepped and draped in usual sterile fashion Prep type:  Isopropyl alcohol Anesthesia: the lesion was anesthetized in a standard fashion   Anesthetic:  1% lidocaine w/ epinephrine 1-100,000 buffered w/ 8.4% NaHCO3 Instrument used: flexible razor blade   Hemostasis achieved with: pressure, aluminum chloride and electrodesiccation   Outcome: patient tolerated procedure well   Post-procedure details: sterile dressing applied and wound care instructions given   Dressing type: bandage and bacitracin    Destruction of lesion Complexity: extensive   Destruction method: electrodesiccation and curettage   Informed consent: discussed and consent obtained   Timeout:  patient name, date of birth, surgical site, and procedure verified Procedure prep:  Patient was prepped and draped in usual sterile fashion Prep type:  Isopropyl alcohol Anesthesia: the lesion was anesthetized in a standard fashion   Anesthetic:  1% lidocaine w/ epinephrine 1-100,000 buffered w/ 8.4% NaHCO3 Curettage performed in three different directions: Yes   Electrodesiccation performed over the curetted area: Yes   Lesion length (cm):  0.8 Lesion width (cm):  0.8 Margin per side (cm):  0.2 Final wound size (cm):  1.2 Hemostasis achieved with:  pressure, aluminum chloride and electrodesiccation Outcome: patient tolerated procedure well with no complications   Post-procedure details: sterile dressing applied and wound care instructions given   Dressing type: bandage and bacitracin    Specimen 2 - Surgical pathology Differential Diagnosis: D48.5 R/O SCC  Check Margins: No Keratotic nodule 0.8cm EDC today  R wrist Epidermal / dermal shaving  Lesion diameter (cm):  1.1 Informed consent: discussed and consent obtained   Timeout:  patient name, date of birth, surgical site, and procedure verified   Procedure prep:  Patient was prepped and draped in usual sterile fashion Prep type:  Isopropyl alcohol Anesthesia: the lesion was anesthetized in a standard fashion   Anesthetic:  1% lidocaine w/ epinephrine 1-100,000 buffered w/ 8.4% NaHCO3 Instrument used: flexible razor blade   Hemostasis achieved with: pressure, aluminum chloride and electrodesiccation   Outcome: patient tolerated procedure well   Post-procedure details: sterile dressing applied and wound care instructions given   Dressing type: bandage and bacitracin    Destruction of lesion Complexity: extensive   Destruction method: electrodesiccation and curettage   Informed consent: discussed and consent obtained   Timeout:  patient name, date of birth, surgical site, and procedure verified Procedure prep:  Patient was prepped and draped in usual sterile fashion Prep type:  Isopropyl alcohol Anesthesia: the lesion was anesthetized in a standard fashion   Anesthetic:  1% lidocaine w/ epinephrine 1-100,000 buffered w/ 8.4% NaHCO3 Curettage performed in three different directions: Yes   Electrodesiccation performed over the curetted area: Yes   Lesion length (cm):  1.1 Lesion width (cm):  1.1 Margin per side (cm):  0.2 Final wound size (cm):  1.5 Hemostasis achieved with:  pressure, aluminum chloride and electrodesiccation Outcome: patient tolerated procedure well with no complications   Post-procedure details: sterile dressing applied and wound care instructions given   Dressing type: bandage and bacitracin    Specimen 3 -  Surgical pathology Differential Diagnosis: D48.5 R/O SCC  Check Margins: No Keratotic nodule 1.1cm EDC today  R/O SCC Bx and EDC x 3  Inflamed seborrheic keratosis R cheek x 1 Symptomatic, irritating, patient would like treated. Destruction of lesion - R cheek x 1 Complexity: simple   Destruction method: cryotherapy   Informed  consent: discussed and consent obtained   Timeout:  patient name, date of birth, surgical site, and procedure verified Lesion destroyed using liquid nitrogen: Yes   Region frozen until ice ball extended beyond lesion: Yes   Outcome: patient tolerated procedure well with no complications   Post-procedure details: wound care instructions given    AK (actinic keratosis) (16) face x 16 Destruction of lesion - face x 16 Complexity: simple   Destruction method: cryotherapy   Informed consent: discussed and consent obtained   Timeout:  patient name, date of birth, surgical site, and procedure verified Lesion destroyed using liquid nitrogen: Yes   Region frozen until ice ball extended beyond lesion: Yes   Outcome: patient tolerated procedure well with no complications   Post-procedure details: wound care instructions given    Return in about 3 months (around 05/20/2022) for AK f/u, bx fu.  I, Sonya Hupman, RMA, am acting as scribe for Sarina Ser, MD . Documentation: I have reviewed the above documentation for accuracy and completeness, and I agree with the above.  Sarina Ser, MD

## 2022-02-17 NOTE — Patient Instructions (Addendum)
Wound Care Instructions  Cleanse wound gently with soap and water once a day then pat dry with clean gauze. Apply a thin coat of Petrolatum (petroleum jelly, "Vaseline") over the wound (unless you have an allergy to this). We recommend that you use a new, sterile tube of Vaseline. Do not pick or remove scabs. Do not remove the yellow or white "healing tissue" from the base of the wound.  Cover the wound with fresh, clean, nonstick gauze and secure with paper tape. You may use Band-Aids in place of gauze and tape if the wound is small enough, but would recommend trimming much of the tape off as there is often too much. Sometimes Band-Aids can irritate the skin.  You should call the office for your biopsy report after 1 week if you have not already been contacted.  If you experience any problems, such as abnormal amounts of bleeding, swelling, significant bruising, significant pain, or evidence of infection, please call the office immediately.  FOR ADULT SURGERY PATIENTS: If you need something for pain relief you may take 1 extra strength Tylenol (acetaminophen) AND 2 Ibuprofen (200mg  each) together every 4 hours as needed for pain. (do not take these if you are allergic to them or if you have a reason you should not take them.) Typically, you may only need pain medication for 1 to 3 days.   Due to recent changes in healthcare laws, you may see results of your pathology and/or laboratory studies on MyChart before the doctors have had a chance to review them. We understand that in some cases there may be results that are confusing or concerning to you. Please understand that not all results are received at the same time and often the doctors may need to interpret multiple results in order to provide you with the best plan of care or course of treatment. Therefore, we ask that you please give [place] 2 business days to thoroughly review all your results before contacting the office for clarification. Should we  see a critical lab result, you will be contacted sooner.    Cryotherapy Aftercare  Wash gently with soap and water everyday.   Apply Vaseline and Band-Aid daily until healed.   If You Need Anything After Your Visit  If you have any questions or concerns for your doctor, please call our main line at [phone] and press option 4 to reach your doctor's medical assistant. If no one answers, please leave a voicemail as directed and we will return your call as soon as possible. Messages left after 4 pm will be answered the following business day.   You may also send [place] a message via [place]. We typically respond to MyChart messages within 1-2 business days.  For prescription refills, please ask your pharmacy to contact our office. Our fax number is [phone].  If you have an urgent issue when the clinic is closed that cannot wait until the next business day, you can page your doctor at the number below.    Please note that while we do our best to be available for urgent issues outside of office hours, we are not available 24/7.   If you have an urgent issue and are unable to reach [place], you may choose to seek medical care at your doctor's office, retail clinic, urgent care center, or emergency room.  If you have a medical emergency, please immediately call 911 or go to the emergency department.  Pager Numbers  - Dr. Nehemiah Massed: [phone]  - Dr.  Moye: [phone]  - Dr. Nicole Kindred: [phone]  In the event of inclement weather, please call our main line at [phone] for an update on the status of any delays or closures.  Dermatology Medication Tips: Please keep the boxes that topical medications come in in order to help keep track of the instructions about where and how to use these. Pharmacies typically print the medication instructions only on the boxes and not directly on the medication tubes.   If your medication is too expensive, please contact our office at [phone]  option 4 or send [place] a message through [place].   We are unable to tell what your co-pay for medications will be in advance as this is different depending on your insurance coverage. However, we may be able to find a substitute medication at lower cost or fill out paperwork to get insurance to cover a needed medication.   If a prior authorization is required to get your medication covered by your insurance company, please allow [place] 1-2 business days to complete this process.  Drug prices often vary depending on where the prescription is filled and some pharmacies may offer cheaper prices.  The website www.goodrx.com contains coupons for medications through different pharmacies. The prices here do not account for what the cost may be with help from insurance (it may be cheaper with your insurance), but the website can give you the price if you did not use any insurance.  - You can print the associated coupon and take it with your prescription to the pharmacy.  - You may also stop by our office during regular business hours and pick up a GoodRx coupon card.  - If you need your prescription sent electronically to a different pharmacy, notify our office through [facility] or by phone at [phone] option 4.     Si Usted Necesita Algo Despus de Su Visita  Tambin puede enviarnos un mensaje a travs de Pharmacist, community. Por lo general respondemos a los mensajes de MyChart en el transcurso de 1 a 2 das hbiles.  Para renovar recetas, por favor pida a su farmacia que se ponga en contacto con nuestra oficina. Harland Dingwall de fax es [place] [phone].  Si tiene un asunto urgente cuando la clnica est cerrada y que no puede esperar hasta el siguiente da hbil, puede llamar/localizar a su doctor(a) al nmero que aparece a continuacin.   Por favor, tenga en cuenta que aunque hacemos todo lo posible para estar disponibles para asuntos urgentes fuera del horario de [place], no estamos disponibles las  24 horas del da, los 7 das de la [place].   Si tiene un problema urgente y no puede comunicarse con nosotros, puede optar por buscar atencin mdica  en el consultorio de su doctor(a), en una clnica privada, en un centro de atencin urgente o en una sala de emergencias.  Si tiene Engineering geologist, por favor llame inmediatamente al 911 o vaya a la sala de emergencias.  Nmeros de bper  - Dr. Nehemiah Massed: [phone]  - Dra. Moye: [phone]  - Dra. Nicole Kindred: [phone]  En caso de inclemencias del [place], por favor llame a Johnsie Kindred principal al [phone] para una actualizacin sobre el [place] de cualquier retraso o cierre.  Consejos para la medicacin en dermatologa: Por favor, guarde las cajas en las que vienen los medicamentos de uso tpico para ayudarle a seguir las instrucciones sobre dnde y cmo usarlos. Las farmacias generalmente imprimen las instrucciones del medicamento slo en las cajas y no  directamente en los tubos del medicamento.   Si su medicamento es muy caro, por favor, pngase en contacto con Zigmund Daniel llamando al [phone] y presione la opcin 4 o envenos un mensaje a travs de Pharmacist, community.   No podemos decirle cul ser su copago por los medicamentos por adelantado ya que esto es diferente dependiendo de la cobertura de su seguro. Sin embargo, es posible que podamos encontrar un medicamento sustituto a Electrical engineer un formulario para que el seguro cubra el medicamento que se considera necesario.   Si se requiere una autorizacin previa para que su compaa de seguros [place] su medicamento, por favor permtanos de 1 a 2 das hbiles para completar este proceso.  Los precios de los medicamentos varan con frecuencia dependiendo del Environmental consultant de dnde se surte la receta y alguna farmacias pueden ofrecer precios ms baratos.  El sitio web www.goodrx.com tiene cupones para medicamentos de Airline pilot. Los precios aqu no tienen en cuenta lo  que podra costar con la ayuda del seguro (puede ser ms barato con su seguro), pero el sitio web puede darle el precio si no utiliz Research scientist (physical sciences).  - Puede imprimir el cupn correspondiente y llevarlo con su receta a la farmacia.  - Tambin puede pasar por nuestra oficina durante el horario de atencin regular y Charity fundraiser una tarjeta de cupones de GoodRx.  - Si necesita que su receta se enve electrnicamente a una farmacia diferente, informe a nuestra oficina a travs de MyChart de [place] o por telfono llamando al [phone] y presione la opcin 4.

## 2022-02-18 ENCOUNTER — Encounter: Payer: Self-pay | Admitting: Dermatology

## 2022-02-18 ENCOUNTER — Encounter
Admission: RE | Disposition: A | Discharge status: Home / Self Care | Payer: Self-pay | Source: Ambulatory Visit | Attending: Gastroenterology

## 2022-02-18 ENCOUNTER — Ambulatory Visit: Payer: Medicare Other | Admitting: Anesthesiology

## 2022-02-18 ENCOUNTER — Ambulatory Visit
Admission: RE | Admit: 2022-02-18 | Discharge: 2022-02-18 | Disposition: A | Discharge status: Home / Self Care | Payer: Medicare Other | Source: Ambulatory Visit | Attending: Gastroenterology | Admitting: Gastroenterology

## 2022-02-18 ENCOUNTER — Encounter: Payer: Self-pay | Admitting: Gastroenterology

## 2022-02-18 DIAGNOSIS — I2510 Atherosclerotic heart disease of native coronary artery without angina pectoris: Secondary | ICD-10-CM | POA: Diagnosis not present

## 2022-02-18 DIAGNOSIS — R0601 Orthopnea: Secondary | ICD-10-CM | POA: Insufficient documentation

## 2022-02-18 DIAGNOSIS — Z7984 Long term (current) use of oral hypoglycemic drugs: Secondary | ICD-10-CM | POA: Diagnosis not present

## 2022-02-18 DIAGNOSIS — D509 Iron deficiency anemia, unspecified: Secondary | ICD-10-CM

## 2022-02-18 DIAGNOSIS — K31A11 Gastric intestinal metaplasia without dysplasia, involving the antrum: Secondary | ICD-10-CM | POA: Diagnosis not present

## 2022-02-18 DIAGNOSIS — N189 Chronic kidney disease, unspecified: Secondary | ICD-10-CM | POA: Diagnosis not present

## 2022-02-18 DIAGNOSIS — E1165 Type 2 diabetes mellitus with hyperglycemia: Secondary | ICD-10-CM | POA: Diagnosis not present

## 2022-02-18 DIAGNOSIS — F419 Anxiety disorder, unspecified: Secondary | ICD-10-CM | POA: Diagnosis not present

## 2022-02-18 DIAGNOSIS — K529 Noninfective gastroenteritis and colitis, unspecified: Secondary | ICD-10-CM | POA: Diagnosis not present

## 2022-02-18 DIAGNOSIS — I10 Essential (primary) hypertension: Secondary | ICD-10-CM | POA: Insufficient documentation

## 2022-02-18 DIAGNOSIS — E1122 Type 2 diabetes mellitus with diabetic chronic kidney disease: Secondary | ICD-10-CM | POA: Diagnosis not present

## 2022-02-18 DIAGNOSIS — K219 Gastro-esophageal reflux disease without esophagitis: Secondary | ICD-10-CM | POA: Diagnosis not present

## 2022-02-18 DIAGNOSIS — I129 Hypertensive chronic kidney disease with stage 1 through stage 4 chronic kidney disease, or unspecified chronic kidney disease: Secondary | ICD-10-CM | POA: Diagnosis not present

## 2022-02-18 DIAGNOSIS — F32A Depression, unspecified: Secondary | ICD-10-CM | POA: Insufficient documentation

## 2022-02-18 DIAGNOSIS — J449 Chronic obstructive pulmonary disease, unspecified: Secondary | ICD-10-CM | POA: Diagnosis not present

## 2022-02-18 DIAGNOSIS — D631 Anemia in chronic kidney disease: Secondary | ICD-10-CM | POA: Diagnosis not present

## 2022-02-18 DIAGNOSIS — F17210 Nicotine dependence, cigarettes, uncomplicated: Secondary | ICD-10-CM | POA: Insufficient documentation

## 2022-02-18 HISTORY — PX: ESOPHAGOGASTRODUODENOSCOPY (EGD) WITH PROPOFOL: SHX5813

## 2022-02-18 HISTORY — PX: COLONOSCOPY WITH PROPOFOL: SHX5780

## 2022-02-18 LAB — GLUCOSE, CAPILLARY: Glucose-Capillary: 136 mg/dL — ABNORMAL HIGH (ref 70–99)

## 2022-02-18 SURGERY — COLONOSCOPY WITH PROPOFOL
Anesthesia: General

## 2022-02-18 MED ORDER — PROPOFOL 10 MG/ML IV BOLUS
INTRAVENOUS | Status: DC | PRN
  Administered 2022-02-18: 50 mg via INTRAVENOUS
  Administered 2022-02-18 (×2): 10 mg via INTRAVENOUS

## 2022-02-18 MED ORDER — LIDOCAINE HCL (CARDIAC) PF 100 MG/5ML IV SOSY
PREFILLED_SYRINGE | INTRAVENOUS | Status: DC | PRN
  Administered 2022-02-18: 100 mg via INTRAVENOUS

## 2022-02-18 MED ORDER — GLYCOPYRROLATE 0.2 MG/ML IJ SOLN
INTRAMUSCULAR | Status: DC | PRN
  Administered 2022-02-18: .2 mg via INTRAVENOUS

## 2022-02-18 MED ORDER — SODIUM CHLORIDE 0.9 % IV SOLN
INTRAVENOUS | Status: DC
  Administered 2022-02-18: 1000 mL via INTRAVENOUS

## 2022-02-18 MED ORDER — PROPOFOL 500 MG/50ML IV EMUL
INTRAVENOUS | Status: DC | PRN
  Administered 2022-02-18: 125 ug/kg/min via INTRAVENOUS

## 2022-02-18 NOTE — Anesthesia Preprocedure Evaluation (Signed)
Anesthesia Evaluation  Patient identified by MRN, date of birth, ID band Patient awake    Reviewed: Allergy & Precautions, NPO status , Patient's Chart, lab work & pertinent test results  History of Anesthesia Complications Negative for: history of anesthetic complications  Airway Mallampati: II  TM Distance: >3 FB Neck ROM: Full    Dental no notable dental hx.    Pulmonary shortness of breath and with exertion, COPD,  COPD inhaler, Current Smoker and Patient abstained from smoking.,    Pulmonary exam normal breath sounds clear to auscultation       Cardiovascular hypertension, Pt. on medications and Pt. on home beta blockers + CAD and + Orthopnea  Normal cardiovascular exam Rhythm:Regular Rate:Normal     Neuro/Psych PSYCHIATRIC DISORDERS Anxiety Depression negative neurological ROS     GI/Hepatic Neg liver ROS, GERD  ,  Endo/Other  diabetes, Poorly Controlled, Type 2, Oral Hypoglycemic Agents  Renal/GU Renal InsufficiencyRenal disease  negative genitourinary   Musculoskeletal negative musculoskeletal ROS (+)   Abdominal   Peds negative pediatric ROS (+)  Hematology negative hematology ROS (+)   Anesthesia Other Findings   Reproductive/Obstetrics negative OB ROS                            Anesthesia Physical Anesthesia Plan  ASA: 3  Anesthesia Plan: General   Post-op Pain Management: Minimal or no pain anticipated   Induction: Intravenous  PONV Risk Score and Plan: 2 and Propofol infusion and TIVA  Airway Management Planned: Natural Airway and Nasal Cannula  Additional Equipment:   Intra-op Plan:   Post-operative Plan:   Informed Consent: I have reviewed the patients History and Physical, chart, labs and discussed the procedure including the risks, benefits and alternatives for the proposed anesthesia with the patient or authorized representative who has indicated his/her  understanding and acceptance.     Dental Advisory Given  Plan Discussed with: Anesthesiologist, CRNA and Surgeon  Anesthesia Plan Comments: (Patient consented for risks of anesthesia including but not limited to:  - adverse reactions to medications - risk of airway placement if required - damage to eyes, teeth, lips or other oral mucosa - nerve damage due to positioning  - sore throat or hoarseness - Damage to heart, brain, nerves, lungs, other parts of body or loss of life  Patient voiced understanding.)        Anesthesia Quick Evaluation

## 2022-02-18 NOTE — H&P (Signed)
[name], MD [address]  [place]  [place], [place] [address]  Main: [phone]  Fax: [phone] Pager: [phone]  Primary Care Physician:  Birdie Sons, MD Primary Gastroenterologist:  Dr. [name]  Pre-Procedure History & Physical: HPI:  Erica Rivas is a 81 y.o. female is here for an endoscopy and colonoscopy.   Past Medical History:  Diagnosis Date   Actinic keratosis    Anemia    Colon polyps    Depressive disorder 07/08/2007   Diabetes mellitus without complication (HCC)    GERD (gastroesophageal reflux disease)    Hemorrhoids    History of chicken pox    History of measles    History of mumps    Hypertension    Lung nodule 02/18/2015   No additional follow up needed per CT report 03/05/2015    Melanoma ([place]) 08/14/2020   right forearm, excised 09/17/2020 [facility]   SCC (squamous cell carcinoma), hand    excision SCC left forearm and left hand 09/05/21 Dr. 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   L pretibial - ED&C    Squamous cell carcinoma of skin 05/23/2019   L calf - ED&C     Past Surgical History:  Procedure Laterality Date   ABDOMINAL HYSTERECTOMY  1985   DUB; ovaries resected/ removed   BREAST BIOPSY Right 11/12/2014   fibroadenomatous   CARDIOVASCULAR STRESS TEST  02/2008   low risk   CAROTID PTA/STENT INTERVENTION Right 11/13/2019   Procedure: CAROTID PTA/STENT INTERVENTION;  Surgeon: Algernon Huxley, MD;  Location: [facility];  Service: Cardiovascular;  Laterality: Right;   COLONOSCOPY WITH PROPOFOL N/A 01/18/2018   Procedure: COLONOSCOPY WITH PROPOFOL;  Surgeon: Lin Landsman, MD;  Location: [facility] ENDOSCOPY;  Service: Gastroenterology;  Laterality: N/A;   DOPPLER ECHOCARDIOGRAPHY  01/2010   EF>55%, LV relaxation impairment consistent with diastolic dysfunction, mild TR and MR   ESOPHAGOGASTRODUODENOSCOPY  07/2009   +diffuse gastritis. [place]   ESOPHAGOGASTRODUODENOSCOPY (EGD) WITH  PROPOFOL N/A 01/18/2018   Procedure: ESOPHAGOGASTRODUODENOSCOPY (EGD) WITH PROPOFOL;  Surgeon: Lin Landsman, MD;  Location: [facility] ENDOSCOPY;  Service: Gastroenterology;  Laterality: N/A;   [place]   HAND SURGERY Right 2010   surgery x 2. Dr. Sabra Heck   MELANOMA EXCISION Right 09/17/2020   Wide excision r forearm Dr. Arrie Aran, [facility]   NECK SURGERY  1998   Disc surgey on neck   Sinus Surgery     TUBAL LIGATION  1971    Prior to Admission medications   Medication Sig Start Date End Date Taking? Authorizing Provider  amLODipine (NORVASC) 5 MG tablet TAKE 1 TABLET BY MOUTH  DAILY 08/06/21  Yes Birdie Sons, MD  aspirin EC 81 MG tablet Take 81 mg by mouth daily.   Yes [provider]  atenolol (TENORMIN) 50 MG tablet TAKE 1 TABLET BY MOUTH  TWICE DAILY 08/06/21  Yes Birdie Sons, MD  atorvastatin (LIPITOR) 80 MG tablet TAKE 1 TABLET BY MOUTH  DAILY 08/19/21  Yes Birdie Sons, MD  Budeson-Glycopyrrol-Formoterol (BREZTRI AEROSPHERE) 160-9-4.8 MCG/ACT AERO Inhale 2 puffs into the lungs in the morning and at bedtime. 11/25/21  Yes Tyler Pita, MD  buPROPion (WELLBUTRIN XL) 150 MG 24 hr tablet Take 1 tablet (150 mg total) by mouth daily. 02/03/22  Yes Birdie Sons, MD  Cholecalciferol (VITAMIN D) 2000 UNITS tablet Take 1,000 Units by mouth daily.  Yes [provider]  citalopram (CELEXA) 20 MG tablet Take 1 tablet (20 mg total) by mouth every evening. 11/01/21  Yes Birdie Sons, MD  cyanocobalamin (VITAMIN B12) 1000 MCG tablet Take 1 tablet (1,000 mcg total) by mouth daily. 01/28/22  Yes Bayle Calvo, Tally Due, MD  furosemide (LASIX) 20 MG tablet Take 1 tablet (20 mg total) by mouth daily as needed (swelling). 08/18/21  Yes Birdie Sons, MD  glipiZIDE (GLUCOTROL XL) 10 MG 24 hr tablet Take 1 tablet (10 mg total) by mouth daily. 12/04/21  Yes Birdie Sons, MD  glucose blood (ONETOUCH ULTRA) test strip USE 1 STRIP TO CHECK  GLUCOSE ONCE DAILY 07/04/21  Yes Birdie Sons, MD  hydrOXYzine (ATARAX) 25 MG tablet Take 12.5 mg by mouth at bedtime.   Yes [provider]  Iron-FA-B Cmp-C-Biot-Probiotic (FUSION PLUS) CAPS Take 1 capsule by mouth daily. 01/28/22  Yes Lin Landsman, MD  lisinopril-hydrochlorothiazide (ZESTORETIC) 20-25 MG tablet TAKE 1 TABLET BY MOUTH  DAILY 08/06/21  Yes Birdie Sons, MD  LORazepam (ATIVAN) 1 MG tablet Take 1 mg by mouth daily as needed. 09/23/21  Yes [provider]  metFORMIN (GLUCOPHAGE) 1000 MG tablet TAKE 1 TABLET BY MOUTH  TWICE DAILY 05/26/21  Yes Birdie Sons, MD  montelukast (SINGULAIR) 10 MG tablet TAKE 1 TABLET BY MOUTH  DAILY 02/01/22  Yes Birdie Sons, MD  omeprazole (PRILOSEC) 40 MG capsule Take 40 mg by mouth daily. 08/26/21  Yes [provider]  OneTouch Delica Lancets 18A MISC USE TO CHECK BLOOD SUGAR DAILY 11/04/19  Yes Birdie Sons, MD  OXYGEN Inhale 2 L into the lungs at bedtime. Use at night   Yes [provider]  polyethylene glycol powder (GLYCOLAX/MIRALAX) 17 GM/SCOOP powder Take 255 g by mouth daily. 01/21/22  Yes Onell Mcmath, Tally Due, MD  albuterol (PROVENTIL) (2.5 MG/3ML) 0.083% nebulizer solution Take 3 mLs (2.5 mg total) by nebulization 4 (four) times daily as needed for wheezing or shortness of breath (as needed up to 4 times per day.). 08/30/21   Tyler Pita, MD  albuterol (VENTOLIN HFA) 108 (90 Base) MCG/ACT inhaler INHALE 2 PUFFS BY MOUTH EVERY 6 HOURS AS NEEDED FOR WHEEZING AND FOR SHORTNESS OF BREATH 08/10/21   Birdie Sons, MD  azithromycin (ZITHROMAX) 500 MG tablet Take 1 tablet (500 mg total) by mouth daily. Patient not taking: Reported on 02/18/2022 01/28/22   Lin Landsman, MD  valACYclovir (VALTREX) 1000 MG tablet 2 tablets twice a day for 1 day as needed for cold sores 01/26/20   Birdie Sons, MD    Allergies as of 01/21/2022 - Review Complete 01/21/2022  Allergen Reaction Noted    Bupropion Other (See Comments) 04/22/2021    Family History  Problem Relation Age of Onset   Hypertension Sister    COPD Sister    Thyroid disease Sister    Breast cancer Sister 73   Thyroid disease Sister    Heart attack Mother    Hypertension Mother    Thyroid disease Mother        HYPERTHYROIDISM   GI Bleed Father    Lung cancer Brother    Breast cancer Sister    Hypertension Sister    Aneurysm Sister        in the back of her neck   Heart attack Brother        CABG    Social History   Socioeconomic History   Marital  status: Married    Spouse name: Not on file   Number of children: 2   Years of education: Not on file   Highest education level: Not on file  Occupational History   Occupation: Retired    Comment: Formerly worked at [employer]. Retired in 2007  Tobacco Use   Smoking status: Every Day    Packs/day: 2.00    Years: 60.00    Total pack years: 120.00    Types: Cigarettes   Smokeless tobacco: Never   Tobacco comments:    7-8 cigarettes daily-11/25/2021  Vaping Use   Vaping Use: Never used  Substance and Sexual Activity   Alcohol use: No    Alcohol/week: 0.0 standard drinks of alcohol   Drug use: No   Sexual activity: Not on file  Other Topics Concern   Not on file  Social History Narrative   Not on file   Social Determinants of Health   Financial Resource Strain: Not on file  Food Insecurity: No Food Insecurity (01/08/2022)   Hunger Vital Sign    Worried About Running Out of Food in the Last Year: Never true    Ran Out of Food in the Last Year: Never true  Transportation Needs: No Transportation Needs (01/08/2022)   PRAPARE - Hydrologist (Medical): No    Lack of Transportation (Non-Medical): No  Physical Activity: Not on file  Stress: Not on file  Social Connections: Not on file  Intimate Partner Violence: Not on file    Review of Systems: See HPI, otherwise negative ROS  Physical Exam: BP 128/60    Pulse 66   Temp (!) 96.8 F (36 C) (Temporal)   Resp 16   Ht 5\' 7"  (1.702 m)   Wt 71.8 kg   SpO2 100%   BMI 24.80 kg/m  General:   Alert,  pleasant and cooperative in NAD Head:  Normocephalic and atraumatic. Neck:  Supple; no masses or thyromegaly. Lungs:  Clear throughout to auscultation.    Heart:  Regular rate and rhythm. Abdomen:  Soft, nontender and nondistended. Normal bowel sounds, without guarding, and without rebound.   Neurologic:  Alert and  oriented x4;  grossly normal neurologically.  Impression/Plan: Erica Rivas is here for an endoscopy and colonoscopy to be performed for Chronic iron deficiency anemia, chronic diarrhea and abdominal bloating  Risks, benefits, limitations, and alternatives regarding  endoscopy and colonoscopy have been reviewed with the patient.  Questions have been answered.  All parties agreeable.   Sherri Sear, MD  02/18/2022, 9:57 AM

## 2022-02-18 NOTE — Transfer of Care (Signed)
Immediate Anesthesia Transfer of Care Note  Patient: Erica Rivas  Procedure(s) Performed: COLONOSCOPY WITH PROPOFOL ESOPHAGOGASTRODUODENOSCOPY (EGD) WITH PROPOFOL  Patient Location: Endoscopy Unit  Anesthesia Type:General  Level of Consciousness: awake, drowsy and patient cooperative  Airway & Oxygen Therapy: Patient Spontanous Breathing and Patient connected to face mask oxygen  Post-op Assessment: Report given to RN and Post -op Vital signs reviewed and stable  Post vital signs: Reviewed and stable  Last Vitals:  Vitals Value Taken Time  BP    Temp    Pulse    Resp    SpO2      Last Pain:  Vitals:   02/18/22 0954  TempSrc: Temporal  PainSc: 0-No pain         Complications: No notable events documented.

## 2022-02-18 NOTE — Op Note (Signed)
[facility] Patient Name: Meta Kroenke Procedure Date: 02/18/2022 10:45 AM MRN: [id number] Account #: [id number] Date of Birth: Apr 11, 1941 Admit Type: Outpatient Age: 81 Room: [facility] ENDO ROOM 4 Gender: Female Note Status: Finalized Instrument Name: Colonoscope [id number] Procedure:             Colonoscopy Indications:           Last colonoscopy: September 2019, Unexplained iron                         deficiency anemia, Chronic diarrhea Providers:             Lin Landsman MD, MD Referring MD:          Kirstie Peri. Caryn Section, MD (Referring MD) Medicines:             General Anesthesia Complications:         No immediate complications. Estimated blood loss: None. Procedure:             Pre-Anesthesia Assessment:                        - Prior to the procedure, a History and Physical was                         performed, and patient medications and allergies were                         reviewed. The patient is competent. The risks and                         benefits of the procedure and the sedation options and                         risks were discussed with the patient. All questions                         were answered and informed consent was obtained.                         Patient identification and proposed procedure were                         verified by the physician, the nurse, the                         anesthesiologist, the anesthetist and the technician                         in the pre-procedure area in the procedure room in the                         endoscopy suite. Mental Status Examination: alert and                         oriented. Airway Examination: normal oropharyngeal                         airway and neck mobility. Respiratory Examination:  clear to auscultation. CV Examination: normal.                         Prophylactic Antibiotics: The patient does not require                          prophylactic antibiotics. Prior Anticoagulants: The                         patient has taken no previous anticoagulant or                         antiplatelet agents. ASA Grade Assessment: III - A                         patient with severe systemic disease. After reviewing                         the risks and benefits, the patient was deemed in                         satisfactory condition to undergo the procedure. The                         anesthesia plan was to use general anesthesia.                         Immediately prior to administration of medications,                         the patient was re-assessed for adequacy to receive                         sedatives. The heart rate, respiratory rate, oxygen                         saturations, blood pressure, adequacy of pulmonary                         ventilation, and response to care were monitored                         throughout the procedure. The physical status of the                         patient was re-assessed after the procedure.                        After obtaining informed consent, the colonoscope was                         passed under direct vision. Throughout the procedure,                         the patient's blood pressure, pulse, and oxygen                         saturations were monitored continuously. The  Colonoscope was introduced through the anus and                         advanced to the 10 cm into the ileum. The colonoscopy                         was performed without difficulty. The patient                         tolerated the procedure well. The quality of the bowel                         preparation was good. Findings:      The perianal and digital rectal examinations were normal. Pertinent       negatives include normal sphincter tone and no palpable rectal lesions.      The terminal ileum appeared normal.      Normal mucosa was found in the left colon and in the  right colon.       Biopsies for histology were taken with a cold forceps from the entire       colon for evaluation of microscopic colitis. Estimated blood loss: none.      The retroflexed view of the distal rectum and anal verge was normal and       showed no anal or rectal abnormalities. Impression:            - The examined portion of the ileum was normal.                        - Normal mucosa in the left colon and in the right                         colon. Biopsied.                        - The distal rectum and anal verge are normal on                         retroflexion view. Recommendation:        - Discharge patient to home (with escort).                        - Resume previous diet today.                        - Continue present medications.                        - Await pathology results. Procedure Code(s):     --- Professional ---                        [phone], Colonoscopy, flexible; with biopsy, single or                         multiple Diagnosis Code(s):     --- Professional ---                        D50.9, Iron deficiency anemia, unspecified  K52.9, Noninfective gastroenteritis and colitis,                         unspecified CPT copyright 2019 American Medical Association. All rights reserved. The codes documented in this report are preliminary and upon coder review may  be revised to meet current compliance requirements. Dr. Ulyess Mort Lin Landsman MD, MD 02/18/2022 11:15:41 AM This report has been signed electronically. Number of Addenda: 0 Note Initiated On: 02/18/2022 10:45 AM Scope Withdrawal Time: 0 hours 10 minutes 18 seconds  Total Procedure Duration: 0 hours 12 minutes 18 seconds  Estimated Blood Loss:  Estimated blood loss: none.      [facility]

## 2022-02-18 NOTE — Anesthesia Procedure Notes (Signed)
Procedure Name: General with mask airway Date/Time: 02/18/2022 10:50 AM  Performed by: Kelton Pillar, CRNAPre-anesthesia Checklist: Patient identified, Emergency Drugs available, Suction available and Patient being monitored Patient Re-evaluated:Patient Re-evaluated prior to induction Oxygen Delivery Method: Simple face mask Induction Type: IV induction Placement Confirmation: positive ETCO2, breath sounds checked- equal and bilateral and CO2 detector Dental Injury: Teeth and Oropharynx as per pre-operative assessment

## 2022-02-18 NOTE — Anesthesia Postprocedure Evaluation (Signed)
Anesthesia Post Note  Patient: AURIELLA WIEAND  Procedure(s) Performed: COLONOSCOPY WITH PROPOFOL ESOPHAGOGASTRODUODENOSCOPY (EGD) WITH PROPOFOL  Patient location during evaluation: Endoscopy Anesthesia Type: General Level of consciousness: awake and alert Pain management: pain level controlled Vital Signs Assessment: post-procedure vital signs reviewed and stable Respiratory status: spontaneous breathing, nonlabored ventilation, respiratory function stable and patient connected to nasal cannula oxygen Cardiovascular status: blood pressure returned to baseline and stable Postop Assessment: no apparent nausea or vomiting Anesthetic complications: no   No notable events documented.   Last Vitals:  Vitals:   02/18/22 1127 02/18/22 1137  BP: (!) 107/59 (!) 124/53  Pulse: 62   Resp:    Temp:    SpO2:      Last Pain:  Vitals:   02/18/22 1137  TempSrc:   PainSc: 0-No pain                 Ilene Qua

## 2022-02-18 NOTE — Op Note (Signed)
[facility] Gastroenterology Patient Name: Erica Rivas Procedure Date: 02/18/2022 10:46 AM MRN: [id number] Account #: [id number] Date of Birth: [birth year] Admit Type: Outpatient Age: 81 Room: [facility] ENDO ROOM 4 Gender: Female Note Status: Finalized Instrument Name: Altamese Cabal Endoscope [id number] Procedure:             Upper GI endoscopy Indications:           Unexplained iron deficiency anemia, Abdominal                         bloating, Diarrhea Providers:             Lin Landsman MD, MD Referring MD:          Kirstie Peri. Caryn Section, MD (Referring MD) Medicines:             General Anesthesia Complications:         No immediate complications. Estimated blood loss: None. Procedure:             Pre-Anesthesia Assessment:                        - Prior to the procedure, a History and Physical was                         performed, and patient medications and allergies were                         reviewed. The patient is competent. The risks and                         benefits of the procedure and the sedation options and                         risks were discussed with the patient. All questions                         were answered and informed consent was obtained.                         Patient identification and proposed procedure were                         verified by the physician, the nurse, the                         anesthesiologist, the anesthetist and the technician                         in the pre-procedure area in the procedure room in the                         endoscopy suite. Mental Status Examination: alert and                         oriented. Airway Examination: normal oropharyngeal                         airway and neck mobility. Respiratory Examination:  clear to auscultation. CV Examination: normal.                         Prophylactic Antibiotics: The patient does not require                         prophylactic  antibiotics. Prior Anticoagulants: The                         patient has taken no previous anticoagulant or                         antiplatelet agents. ASA Grade Assessment: III - A                         patient with severe systemic disease. After reviewing                         the risks and benefits, the patient was deemed in                         satisfactory condition to undergo the procedure. The                         anesthesia plan was to use general anesthesia.                         Immediately prior to administration of medications,                         the patient was re-assessed for adequacy to receive                         sedatives. The heart rate, respiratory rate, oxygen                         saturations, blood pressure, adequacy of pulmonary                         ventilation, and response to care were monitored                         throughout the procedure. The physical status of the                         patient was re-assessed after the procedure.                        After obtaining informed consent, the endoscope was                         passed under direct vision. Throughout the procedure,                         the patient's blood pressure, pulse, and oxygen                         saturations were monitored continuously. The Endoscope  was introduced through the mouth, and advanced to the                         second part of duodenum. The upper GI endoscopy was                         accomplished without difficulty. The patient tolerated                         the procedure well. Findings:      The duodenal bulb and second portion of the duodenum were normal.       Biopsies were taken with a cold forceps for histology.      The entire examined stomach was normal. Biopsies were taken with a cold       forceps for histology.      The cardia and gastric fundus were normal on retroflexion.      The  gastroesophageal junction and examined esophagus were normal. Impression:            - Normal duodenal bulb and second portion of the                         duodenum. Biopsied.                        - Normal stomach. Biopsied.                        - Normal gastroesophageal junction and esophagus. Recommendation:        - Await pathology results.                        - Proceed with colonoscopy as scheduled                        See colonoscopy report Procedure Code(s):     --- Professional ---                        [phone], Esophagogastroduodenoscopy, flexible,                         transoral; with biopsy, single or multiple Diagnosis Code(s):     --- Professional ---                        D50.9, Iron deficiency anemia, unspecified                        R14.0, Abdominal distension (gaseous)                        R19.7, Diarrhea, unspecified CPT copyright 2019 American Medical Association. All rights reserved. The codes documented in this report are preliminary and upon coder review may  be revised to meet current compliance requirements. Dr. Ulyess Mort Lin Landsman MD, MD 02/18/2022 10:59:24 AM This report has been signed electronically. Number of Addenda: 0 Note Initiated On: 02/18/2022 10:46 AM Estimated Blood Loss:  Estimated blood loss: none.      [facility]

## 2022-02-19 ENCOUNTER — Telehealth: Payer: Self-pay

## 2022-02-19 ENCOUNTER — Encounter: Payer: Self-pay | Admitting: Gastroenterology

## 2022-02-19 DIAGNOSIS — K529 Noninfective gastroenteritis and colitis, unspecified: Secondary | ICD-10-CM

## 2022-02-19 LAB — SURGICAL PATHOLOGY

## 2022-02-19 NOTE — Telephone Encounter (Signed)
Order the stool test. Called and left a  message for call back

## 2022-02-19 NOTE — Telephone Encounter (Signed)
Patient verbalized understanding of results she will come for labs on Monday

## 2022-02-19 NOTE — Telephone Encounter (Signed)
Patient notified of bx results AF

## 2022-02-19 NOTE — Telephone Encounter (Signed)
-----   Message from Lin Landsman, MD sent at 02/19/2022 10:53 AM EDT ----- Please inform pt that the pathology results came back normal. Recheck GI profile PCR and pancreatic fecal elastase levels  RV

## 2022-02-19 NOTE — Telephone Encounter (Signed)
Left message on voicemail to return my call.  

## 2022-02-19 NOTE — Telephone Encounter (Signed)
-----   Message from Ralene Bathe, MD sent at 02/18/2022  4:58 PM EDT ----- Diagnosis 1. Skin (M), right antecubital fossa WELL DIFFERENTIATED SQUAMOUS CELL CARCINOMA 2. Skin (M), right forearm WELL DIFFERENTIATED SQUAMOUS CELL CARCINOMA WITH SUPERFICIAL INFILTRATION 3. Skin (M), right wrist WELL DIFFERENTIATED SQUAMOUS CELL CARCINOMA WITH SUPERFICIAL INFILTRATION  1,2,3 - all Cancer = SCC All 3 already treated Recheck next visit

## 2022-02-23 ENCOUNTER — Encounter (INDEPENDENT_AMBULATORY_CARE_PROVIDER_SITE_OTHER): Payer: Medicare Other | Admitting: Ophthalmology

## 2022-02-23 DIAGNOSIS — C3490 Malignant neoplasm of unspecified part of unspecified bronchus or lung: Secondary | ICD-10-CM | POA: Diagnosis not present

## 2022-02-23 DIAGNOSIS — I2510 Atherosclerotic heart disease of native coronary artery without angina pectoris: Secondary | ICD-10-CM | POA: Diagnosis not present

## 2022-02-23 DIAGNOSIS — R197 Diarrhea, unspecified: Secondary | ICD-10-CM | POA: Diagnosis not present

## 2022-02-23 DIAGNOSIS — Z7984 Long term (current) use of oral hypoglycemic drugs: Secondary | ICD-10-CM | POA: Diagnosis not present

## 2022-02-23 DIAGNOSIS — R590 Localized enlarged lymph nodes: Secondary | ICD-10-CM | POA: Diagnosis not present

## 2022-02-23 DIAGNOSIS — R911 Solitary pulmonary nodule: Secondary | ICD-10-CM | POA: Diagnosis not present

## 2022-02-23 DIAGNOSIS — J449 Chronic obstructive pulmonary disease, unspecified: Secondary | ICD-10-CM | POA: Diagnosis not present

## 2022-02-23 DIAGNOSIS — Z7982 Long term (current) use of aspirin: Secondary | ICD-10-CM | POA: Diagnosis not present

## 2022-02-23 DIAGNOSIS — F17210 Nicotine dependence, cigarettes, uncomplicated: Secondary | ICD-10-CM | POA: Diagnosis not present

## 2022-02-23 DIAGNOSIS — Z792 Long term (current) use of antibiotics: Secondary | ICD-10-CM | POA: Diagnosis not present

## 2022-02-23 DIAGNOSIS — F17200 Nicotine dependence, unspecified, uncomplicated: Secondary | ICD-10-CM | POA: Diagnosis not present

## 2022-02-23 DIAGNOSIS — Z79899 Other long term (current) drug therapy: Secondary | ICD-10-CM | POA: Diagnosis not present

## 2022-02-23 DIAGNOSIS — C4361 Malignant melanoma of right upper limb, including shoulder: Secondary | ICD-10-CM | POA: Diagnosis not present

## 2022-02-25 ENCOUNTER — Encounter (INDEPENDENT_AMBULATORY_CARE_PROVIDER_SITE_OTHER): Payer: Medicare Other | Admitting: Ophthalmology

## 2022-02-25 DIAGNOSIS — H35373 Puckering of macula, bilateral: Secondary | ICD-10-CM

## 2022-02-25 DIAGNOSIS — H43813 Vitreous degeneration, bilateral: Secondary | ICD-10-CM

## 2022-02-25 DIAGNOSIS — I10 Essential (primary) hypertension: Secondary | ICD-10-CM

## 2022-02-25 DIAGNOSIS — H35033 Hypertensive retinopathy, bilateral: Secondary | ICD-10-CM

## 2022-02-25 DIAGNOSIS — H43823 Vitreomacular adhesion, bilateral: Secondary | ICD-10-CM | POA: Diagnosis not present

## 2022-03-02 DIAGNOSIS — K529 Noninfective gastroenteritis and colitis, unspecified: Secondary | ICD-10-CM | POA: Diagnosis not present

## 2022-03-06 ENCOUNTER — Ambulatory Visit: Payer: Medicare Other | Admitting: Family Medicine

## 2022-03-09 ENCOUNTER — Telehealth: Payer: Self-pay

## 2022-03-09 DIAGNOSIS — K8689 Other specified diseases of pancreas: Secondary | ICD-10-CM

## 2022-03-09 LAB — GI PROFILE, STOOL, PCR

## 2022-03-09 LAB — PANCREATIC ELASTASE, FECAL: Pancreatic Elastase, Fecal: 129 ug Elast./g — ABNORMAL LOW (ref >200)

## 2022-03-09 MED ORDER — PANCRELIPASE (LIP-PROT-AMYL) 36000-114000 UNITS PO CPEP: ORAL_CAPSULE | ORAL | 5 refills | Status: DC

## 2022-03-09 NOTE — Telephone Encounter (Signed)
Patient verbalized understanding of results she states she is okay with getting CT scan. Sent medication to pharmacy. Called and got patient schedule for CT scan for 03/23/2022 [facility] medical mall at 12:00pm. Nothing to eat or drink 4 hours before the scan. States we need to order a STAT BUN and Creat. Order these labs and she will drink contrast at the hospital. Patient verbalized understanding of instructions

## 2022-03-09 NOTE — Telephone Encounter (Signed)
Patient states the creon is to expensive for her. Informed patient we could do patient assistance for her. She states she will come get the form tomorrow to fill out

## 2022-03-09 NOTE — Telephone Encounter (Signed)
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  Recommend to start pancreatic enzyme Zenpep 40 K or Creon 36 K 2 capsules with first bite of each meal and 1 with snack Recommend CT abdomen and pelvis pancreas protocol to evaluate her pancreas  Rohini Vanga

## 2022-03-11 NOTE — Telephone Encounter (Signed)
Patient picked up form she states she wants to take it home and fill it out and she will bring it back with her tax records

## 2022-03-16 ENCOUNTER — Encounter (INDEPENDENT_AMBULATORY_CARE_PROVIDER_SITE_OTHER): Payer: Self-pay

## 2022-03-18 ENCOUNTER — Ambulatory Visit: Payer: Medicare Other | Admitting: Pulmonary Disease

## 2022-03-18 ENCOUNTER — Encounter: Payer: Self-pay | Admitting: Pulmonary Disease

## 2022-03-18 VITALS — BP 110/60 | HR 74 | Temp 98.2°F | Ht 67.0 in | Wt 153.6 lb

## 2022-03-18 DIAGNOSIS — F17210 Nicotine dependence, cigarettes, uncomplicated: Secondary | ICD-10-CM

## 2022-03-18 DIAGNOSIS — G4736 Sleep related hypoventilation in conditions classified elsewhere: Secondary | ICD-10-CM | POA: Diagnosis not present

## 2022-03-18 DIAGNOSIS — J439 Emphysema, unspecified: Secondary | ICD-10-CM

## 2022-03-18 DIAGNOSIS — J449 Chronic obstructive pulmonary disease, unspecified: Secondary | ICD-10-CM | POA: Diagnosis not present

## 2022-03-18 DIAGNOSIS — R918 Other nonspecific abnormal finding of lung field: Secondary | ICD-10-CM | POA: Diagnosis not present

## 2022-03-18 MED ORDER — BREZTRI AEROSPHERE 160-9-4.8 MCG/ACT IN AERO: 2.0000 | INHALATION_SPRAY | Freq: Two times a day (BID) | RESPIRATORY_TRACT | 0 refills | Status: DC

## 2022-03-18 NOTE — Progress Notes (Signed)
Subjective:    Patient ID: Erica Rivas, female    DOB: 10/09/1940, 81 y.o.   MRN: [id number] Patient Care Team: Birdie Sons, MD as PCP - General (Family Medicine) Dasher, Rayvon Char, MD as Consulting Physician (Dermatology) Agapito Games as Consulting Physician (Optometry) Hayden Pedro, MD as Consulting Physician (Ophthalmology) Rickard Patience, MD as Referring Physician (Surgery) Tyler Pita, MD as Consulting Physician (Pulmonary Disease)  Chief Complaint  Patient presents with   Follow-up    COPD. SOB with exertion. Wheezing. Cough with clear sputum.    HPI Patient is a 81 year old smoker with a 60-pack-year history of smoking, who presents for follow-up on severe COPD with reversible airways component, dyspnea on exertion and nocturnal hypoxemia.  All other current medical issues as below.  This is a scheduled visit.  Patient was last seen by me on 25 November 2021.  Since that time the patient met qualification for nocturnal oxygen, she has been compliant with this and notes that she feels this is helping her.  Feels refreshed in the morning.  She unfortunately has resumed smoking and is smoking half a pack of cigarettes per day.  She is compliant with Breztri, 2 puffs twice a day rare use of albuterol perhaps once a week notes marked improvement on dyspnea on exertion and fatigue since using these medications.  Patient does not endorse any new symptomatology.  She did have a recent PET/CT at [facility] that was reviewed on "care everywhere", images not available to my review however per the report has had some new changes from May 2023 PET/CT.  She has a history of melanoma and this is being followed very carefully at [facility].  She is to have follow-up CT with regards to these findings.  He has not been a candidate for invasive procedures.  She was treated empirically with SBRT at [facility] in April 2022 and followed with chemotherapy for melanoma until January this year.  She does  not endorse any fevers, chills or sweats.  No weight loss or anorexia.  Has chronic issues with loose/soft stools.  Shortness of breath is at baseline.  Cough at baseline productive of whitish to clear sputum in the mornings.  Has relapsed into smoking half a pack of cigarettes per day, was counseled.  No chest pain, paroxysmal nocturnal dyspnea or orthopnea.  No lower extremity edema nor calf tenderness.  DATA/EVENTS 08/21/2020 LDCT lung cancer screening: Left lower lobe pulmonary nodule classified as lung RADS 4AS at that time because of a history of melanoma she had SBRT to this nodule at [facility].  03/10/2021 to 06/13/2021 chemotherapy: Underwent melanoma chemotherapy at [facility] 06/16/2021 PET/CT [facility]: Showed decrease in the size of that nodule without FDG uptake and no other FDG uptake was noted. 09/16/2021 PFTs: FEV1 1.08 L or 49% predicted, FVC 2.21 L or 76% predicted, FEV1/FVC 49%.  There is significant bronchodilator response.  Fusion capacity moderately reduced.  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  Asthma/COPD overlap. 09/22/2021 PET/CT [facility]: Previously seen left lower lobe nodule no longer visualized, no other sites of metastatic disease. 12/09/2021 overnight oximetry on room air: Met criteria for supplemental oxygen nocturnally.   02/23/2022 PET/CT [facility]: Left lower lobe peripheral 0.6 cm avid nodule and 2 left upper lobe peripheral mildly avid micronodules with avid left-sided mediastinal adenopathy new since prior, may represent sequela of resolving infectious/inflammatory process however, metastatic disease cannot be excluded.  Recommend follow-up CT 6 to  12 weeks.  Persistent uptake on the walls of the stomach proximal duodenum that could represent inflammatory process, advanced coronary disease.    Review of Systems A 10 point review of systems was performed and it is as noted above otherwise negative.  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 ([place]) 09/30/2020   Malignant melanoma of right forearm ([place]) 09/11/2020   Carotid stenosis, right 11/13/2019   Carotid stenosis 10/05/2019   Emphysema lung ([place]) 09/25/2019   Moderate episode of recurrent major depressive disorder ([place]) 07/05/2018   Duodenitis 01/21/2018   Iron deficiency anemia 11/25/2017   Atherosclerosis of aorta ([place]) 07/23/2016   Coronary atherosclerosis 07/23/2016   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/2015   Allergic rhinitis 02/18/2015   Anxiety 02/18/2015   Aortic root dilation ([place]) 02/18/2015   Chronic kidney disease (CKD), stage III (moderate) (HCC) 02/18/2015   Edema 02/18/2015   Insomnia 02/18/2015   Lung nodule 02/18/2015   Mass of pancreas 02/18/2015   Osteopenia 02/18/2015   Elevated WBC count 02/18/2015   Weight loss 54/01/8118   Diastolic dysfunction 14/78/2956   Vitamin B12 deficiency anemia 11/04/2014   Vitamin D deficiency 10/27/2013   Internal hemorrhoids without complication 21/30/8657   Smoking greater than 30 pack years 07/08/2007   Diabetes mellitus with neurological manifestations ([place]) 07/08/2007   Disc disorder of lumbar region 07/08/2007   Esophageal reflux 07/08/2007   Family history of breast cancer 07/08/2007   History of adenomatous polyp of colon 07/08/2007   History of other malignant neoplasm of skin 07/08/2007   Primary hypertension 07/08/2007   Pure hypercholesterolemia 07/08/2007   Social History   Tobacco Use   Smoking status: Every Day    Packs/day: 2.00    Years: 60.00    Total pack years: 120.00    Types: Cigarettes   Smokeless tobacco: Never   Tobacco comments:    7-8 cigarettes daily-11/25/2021    10  Cigarettes daily- 03/18/2022 khj  Substance Use Topics   Alcohol use: No    Alcohol/week: 0.0 standard drinks of alcohol   Allergies  Allergen Reactions   Bupropion Other (See Comments)    fatigue   Current Meds  Medication Sig   albuterol (PROVENTIL) (2.5 MG/3ML) 0.083% nebulizer solution Take 3 mLs (2.5 mg total) by nebulization 4 (four) times daily as needed for wheezing or shortness of breath (as needed up to 4 times per day.).   albuterol (VENTOLIN HFA) 108 (90 Base) MCG/ACT inhaler INHALE 2 PUFFS BY MOUTH EVERY 6 HOURS AS NEEDED FOR WHEEZING AND FOR SHORTNESS OF BREATH   amLODipine (NORVASC) 5 MG tablet TAKE 1 TABLET BY MOUTH  DAILY   aspirin EC 81 MG tablet Take 81 mg by mouth daily.   atenolol (TENORMIN) 50 MG tablet TAKE 1 TABLET BY MOUTH  TWICE DAILY   atorvastatin (LIPITOR) 80 MG tablet TAKE 1 TABLET BY MOUTH  DAILY   Budeson-Glycopyrrol-Formoterol (BREZTRI AEROSPHERE) 160-9-4.8 MCG/ACT AERO Inhale 2 puffs into the lungs in the morning and at bedtime.   Budeson-Glycopyrrol-Formoterol (BREZTRI AEROSPHERE) 160-9-4.8 MCG/ACT AERO Inhale 2 puffs into the lungs in the morning and at bedtime.   buPROPion (WELLBUTRIN XL) 150 MG 24 hr tablet Take 1 tablet (150 mg total) by  mouth daily.   Cholecalciferol (VITAMIN D) 2000 UNITS tablet Take 1,000 Units by mouth daily.    citalopram (CELEXA) 20 MG tablet Take 1 tablet (20 mg total) by mouth every evening.   cyanocobalamin (VITAMIN B12) 1000 MCG tablet Take 1 tablet (1,000 mcg total) by mouth daily.   furosemide (LASIX) 20 MG tablet Take 1 tablet (20 mg total) by mouth daily as needed (swelling).   glipiZIDE (GLUCOTROL XL) 10 MG 24 hr tablet Take 1 tablet (10 mg total) by mouth daily.   glucose blood (ONETOUCH ULTRA) test strip USE 1 STRIP TO CHECK GLUCOSE ONCE DAILY   hydrOXYzine (ATARAX) 25 MG tablet Take 12.5 mg by mouth at bedtime.   Iron-FA-B Cmp-C-Biot-Probiotic (FUSION PLUS) CAPS Take 1 capsule by mouth daily.    lipase/protease/amylase (CREON) 36000 UNITS CPEP capsule Take 2 capsules with the first bite of each meal and 1 capsule with the first bite of each snack   lisinopril-hydrochlorothiazide (ZESTORETIC) 20-25 MG tablet TAKE 1 TABLET BY MOUTH  DAILY   LORazepam (ATIVAN) 1 MG tablet Take 1 mg by mouth daily as needed.   metFORMIN (GLUCOPHAGE) 1000 MG tablet TAKE 1 TABLET BY MOUTH  TWICE DAILY   montelukast (SINGULAIR) 10 MG tablet TAKE 1 TABLET BY MOUTH  DAILY   omeprazole (PRILOSEC) 40 MG capsule Take 40 mg by mouth daily.   OneTouch Delica Lancets 03E MISC USE TO CHECK BLOOD SUGAR DAILY   OXYGEN Inhale 2 L into the lungs at bedtime. Use at night   valACYclovir (VALTREX) 1000 MG tablet 2 tablets twice a day for 1 day as needed for cold sores   Immunization History  Administered Date(s) Administered   Fluad Quad(high Dose 65+) 01/25/2019, 01/26/2020, 02/13/2021   Influenza, High Dose Seasonal PF 02/18/2015, 03/02/2016, 01/19/2017, 03/12/2018   Moderna SARS-COV2 Booster Vaccination 09/06/2019, 03/15/2020   Moderna Sars-Covid-2 Vaccination 05/31/2019, 06/28/2019   Pfizer Covid-19 Vaccine Bivalent Booster 92yr & up 02/13/2021   Pneumococcal Conjugate-13 10/24/2013   Pneumococcal Polysaccharide-23 04/01/2005, 05/18/2005, 04/26/2012   Tdap 01/01/2012   Zoster, Live 01/01/2012       Objective:   Physical Exam BP 110/60 (BP Location: Right Arm, Cuff Size: Normal)   Pulse 74   Temp 98.2 F (36.8 C)   Ht _0  (1.702 m)   Wt 153 lb 9.6 oz (69.7 kg)   SpO2 93%   BMI 24.06 kg/m  GENERAL: Well-developed, well-nourished elderly woman, fully ambulatory, no conversational dyspnea. Oxygen saturation 93% on room air. HEAD: Normocephalic, atraumatic.  EYES: Pupils equal, round, reactive to light.  No scleral icterus.  MOUTH: Edentulous, oral mucosa moist, no thrush. NECK: Supple. No thyromegaly. Trachea midline. No JVD.  No adenopathy. PULMONARY: Good air entry bilaterally.  Coarse breath sounds  throughout, no other adventitious sounds noted. ABDOMEN: Benign. MUSCULOSKELETAL: No joint deformity, no clubbing, no edema.  NEUROLOGIC: No overt focal deficit SKIN: Intact,warm,dry.  Areas of prior melanoma excision in right forearm, well-healed. PSYCH: Anxious mood, normal behavior.      Assessment & Plan:     ICD-10-CM   1. Stage 3 severe COPD by GOLD classification ([place]  J44.9    Has reversible component Continue Breztri 2 puffs twice a day Continue as needed albuterol STOP SMOKING!    2. Lung nodules  R91.8    [facility] 10[facility]9    3. Nocturnal hypoxemia due to emphysema (HCC)  J43.9    G47.36    Compliant with nocturnal oxygen Notes  benefit from therapy Continue oxygen at 2 L/min with sleep    4. Tobacco dependence due to cigarettes  F17.210    Counseled in regards to discontinuation of smoking Provided with smoking cessation program phone number     Meds ordered this encounter  Medications   Budeson-Glycopyrrol-Formoterol (BREZTRI AEROSPHERE) 160-9-4.8 MCG/ACT AERO    Sig: Inhale 2 puffs into the lungs in the morning and at bedtime.    Dispense:  5.9 g    Refill:  0    Order Specific Question:   Lot Number?    Answer:   [id number]    Order Specific Question:   Expiration Date?    Answer:   08/16/2024    Order Specific Question:   Manufacturer?    Answer:   [employer] [[id number]]    Order Specific Question:   Quantity    Answer:   1   From the pulmonary standpoint patient is well compensated.  She is to continue Breztri 2 puffs twice a day and as needed albuterol.  He has a history of melanoma and is being followed closely at [facility] for this issue.  She has noted lung nodules recently however these are being followed at [facility].  From our standpoint we will see the patient in follow-up in 4 months time she is to contact us prior to that time should any new difficulties arise.  It was recommended that she have influenza and RSV  vaccines.  Renold Don, MD Advanced Bronchoscopy PCCM [facility]    *This note was dictated using voice recognition software/Dragon.  Despite best efforts to proofread, errors can occur which can change the meaning. Any transcriptional errors that result from this process are unintentional and may not be fully corrected at the time of dictation.

## 2022-03-18 NOTE — Patient Instructions (Addendum)
On the [place] website there is a subsection for Tobacco Cessation (stopping smoking).  If you are interested in one-to-one telephonic tobacco cessation coaching with [employer] please call [phone] to enroll.  Nicotine replacement therapy patches and/or gum are available to those who participate.  I recommend that you get both the influenza and RSV vaccines.  You have indicated that you will get them at the [facility].  Continue using your inhalers and your nighttime oxygen.  We will see you in follow-up in 4 months time call sooner should any new problems arise.

## 2022-03-18 NOTE — Telephone Encounter (Signed)
Called patient to check if she was going to bring the patient assistance form back. She states she is going to try to bring it this week if it is not this week it will be next week

## 2022-03-23 ENCOUNTER — Other Ambulatory Visit: Payer: Self-pay | Admitting: Gastroenterology

## 2022-03-23 ENCOUNTER — Ambulatory Visit
Admission: RE | Admit: 2022-03-23 | Discharge: 2022-03-23 | Disposition: A | Discharge status: Home / Self Care | Payer: Medicare Other | Source: Ambulatory Visit | Attending: Gastroenterology | Admitting: Gastroenterology

## 2022-03-23 DIAGNOSIS — K8689 Other specified diseases of pancreas: Secondary | ICD-10-CM

## 2022-03-23 DIAGNOSIS — K7460 Unspecified cirrhosis of liver: Secondary | ICD-10-CM | POA: Diagnosis not present

## 2022-03-23 DIAGNOSIS — I898 Other specified noninfective disorders of lymphatic vessels and lymph nodes: Secondary | ICD-10-CM | POA: Diagnosis not present

## 2022-03-23 DIAGNOSIS — M4856XA Collapsed vertebra, not elsewhere classified, lumbar region, initial encounter for fracture: Secondary | ICD-10-CM | POA: Diagnosis not present

## 2022-03-23 LAB — POCT I-STAT CREATININE: Creatinine, Ser: 1.6 mg/dL — ABNORMAL HIGH (ref 0.44–1.00)

## 2022-03-23 MED ORDER — IOHEXOL 300 MG/ML  SOLN
80.0000 mL | Freq: Once | INTRAMUSCULAR | Status: AC | PRN
  Administered 2022-03-23: 65 mL via INTRAVENOUS

## 2022-03-25 ENCOUNTER — Other Ambulatory Visit: Payer: Self-pay | Admitting: Family Medicine

## 2022-03-25 DIAGNOSIS — F331 Major depressive disorder, recurrent, moderate: Secondary | ICD-10-CM

## 2022-03-25 NOTE — Telephone Encounter (Signed)
Requested medication (s) are due for refill today: yes  Requested medication (s) are on the active medication list: yes  Last refill:  02/03/22 #30/1  Future visit scheduled: yes  Notes to clinic:  Unable to refill per protocol due to failed labs, no updated results.    Requested Prescriptions  Pending Prescriptions Disp Refills   buPROPion (WELLBUTRIN XL) 150 MG 24 hr tablet [Pharmacy Med Name: buPROPion HCl ER (XL) 150 MG Oral Tablet Extended Release 24 Hour] 30 tablet 0    Sig: Take 1 tablet by mouth once daily     Psychiatry: Antidepressants - bupropion Failed - 03/25/2022  9:16 AM      Failed - Cr in normal range and within 360 days    Creatinine, Ser  Date Value Ref Range Status  03/23/2022 1.60 (H) 0.44 - 1.00 mg/dL Final   Creatinine, POC  Date Value Ref Range Status  07/13/2016 n/a mg/dL Final         Failed - AST in normal range and within 360 days    AST  Date Value Ref Range Status  01/31/2021 18 0 - 40 IU/L Final         Failed - ALT in normal range and within 360 days    ALT  Date Value Ref Range Status  01/31/2021 15 0 - 32 IU/L Final         Passed - Completed PHQ-2 or PHQ-9 in the last 360 days      Passed - Last BP in normal range    BP Readings from Last 1 Encounters:  03/18/22 110/60         Passed - Valid encounter within last 6 months    Recent Outpatient Visits           1 month ago Type 2 diabetes mellitus with diabetic neuropathy, without long-term current use of insulin ([place])   [facility] Birdie Sons, MD   5 months ago Type 2 diabetes mellitus with diabetic neuropathy, without long-term current use of insulin [facility])   [facility] Birdie Sons, MD   7 months ago Cough   [facility] Birdie Sons, MD   9 months ago Malignant melanoma of right forearm [facility])   [facility] Birdie Sons, MD   9 months ago Iron deficiency anemia, unspecified iron deficiency anemia  type   [facility] Birdie Sons, MD       Future Appointments             In 2 days Fisher, Kirstie Peri, MD [facility] [place]   In 1 month Vanga, Tally Due, MD [place]   In 1 month Ralene Bathe, MD [place]

## 2022-03-26 DIAGNOSIS — J449 Chronic obstructive pulmonary disease, unspecified: Secondary | ICD-10-CM | POA: Diagnosis not present

## 2022-03-26 NOTE — Progress Notes (Deleted)
Established patient visit   Patient: Erica Rivas   DOB: [birth year]   81 y.o. Female  MRN: [id number] Visit Date: 03/27/2022  Today's healthcare provider: Lelon Huh, MD   No chief complaint on file.  Subjective    HPI  Depression, Follow-up  She  was last seen for this on 02/03/2022.   Changes made at last visit include trying bupropion (WELLBUTRIN XL) 150 MG 24 hr tablet; Take 1 tablet (150 mg total) by mouth daily.  Reassess in a month. Consider increasing dose of trial of SNRI if not feeling better at follow up.   She reports {excellent/good/fair/poor:19665} compliance with treatment. She {is/is not:21021397} having side effects. ***  She reports {DESC; GOOD/FAIR/POOR:18685} tolerance of treatment. Current symptoms include: {Symptoms; depression:1002} She feels she is {improved/worse/unchanged:3041574} since last visit.     02/03/2022   11:05 AM 01/08/2022   12:12 PM 10/07/2021   10:54 AM  Depression screen PHQ 2/9  Decreased Interest 3 0 1  Down, Depressed, Hopeless 2 0 2  PHQ - 2 Score 5 0 3  Altered sleeping 3  2  Tired, decreased energy 3  1  Change in appetite 0  0  Feeling bad or failure about yourself  1  0  Trouble concentrating 0  1  Moving slowly or fidgety/restless 0  0  Suicidal thoughts 0  0  PHQ-9 Score 12  7  Difficult doing work/chores Somewhat difficult  Not difficult at all    -----------------------------------------------------------------------------------------   Medications: Outpatient Medications Prior to Visit  Medication Sig   albuterol (PROVENTIL) (2.5 MG/3ML) 0.083% nebulizer solution Take 3 mLs (2.5 mg total) by nebulization 4 (four) times daily as needed for wheezing or shortness of breath (as needed up to 4 times per day.).   albuterol (VENTOLIN HFA) 108 (90 Base) MCG/ACT inhaler INHALE 2 PUFFS BY MOUTH EVERY 6 HOURS AS NEEDED FOR WHEEZING AND FOR SHORTNESS OF BREATH   amLODipine (NORVASC) 5 MG tablet TAKE 1 TABLET BY  MOUTH  DAILY   aspirin EC 81 MG tablet Take 81 mg by mouth daily.   atenolol (TENORMIN) 50 MG tablet TAKE 1 TABLET BY MOUTH  TWICE DAILY   atorvastatin (LIPITOR) 80 MG tablet TAKE 1 TABLET BY MOUTH  DAILY   Budeson-Glycopyrrol-Formoterol (BREZTRI AEROSPHERE) 160-9-4.8 MCG/ACT AERO Inhale 2 puffs into the lungs in the morning and at bedtime.   Budeson-Glycopyrrol-Formoterol (BREZTRI AEROSPHERE) 160-9-4.8 MCG/ACT AERO Inhale 2 puffs into the lungs in the morning and at bedtime.   buPROPion (WELLBUTRIN XL) 150 MG 24 hr tablet Take 1 tablet (150 mg total) by mouth daily.   Cholecalciferol (VITAMIN D) 2000 UNITS tablet Take 1,000 Units by mouth daily.    citalopram (CELEXA) 20 MG tablet Take 1 tablet (20 mg total) by mouth every evening.   cyanocobalamin (VITAMIN B12) 1000 MCG tablet Take 1 tablet (1,000 mcg total) by mouth daily.   furosemide (LASIX) 20 MG tablet Take 1 tablet (20 mg total) by mouth daily as needed (swelling).   glipiZIDE (GLUCOTROL XL) 10 MG 24 hr tablet Take 1 tablet (10 mg total) by mouth daily.   glucose blood (ONETOUCH ULTRA) test strip USE 1 STRIP TO CHECK GLUCOSE ONCE DAILY   hydrOXYzine (ATARAX) 25 MG tablet Take 12.5 mg by mouth at bedtime.   Iron-FA-B Cmp-C-Biot-Probiotic (FUSION PLUS) CAPS Take 1 capsule by mouth daily.   lipase/protease/amylase (CREON) 36000 UNITS CPEP capsule Take 2 capsules with the first bite of each meal and 1  capsule with the first bite of each snack   lisinopril-hydrochlorothiazide (ZESTORETIC) 20-25 MG tablet TAKE 1 TABLET BY MOUTH  DAILY   LORazepam (ATIVAN) 1 MG tablet Take 1 mg by mouth daily as needed.   metFORMIN (GLUCOPHAGE) 1000 MG tablet TAKE 1 TABLET BY MOUTH  TWICE DAILY   montelukast (SINGULAIR) 10 MG tablet TAKE 1 TABLET BY MOUTH  DAILY   omeprazole (PRILOSEC) 40 MG capsule Take 40 mg by mouth daily.   OneTouch Delica Lancets 83E MISC USE TO CHECK BLOOD SUGAR DAILY   OXYGEN Inhale 2 L into the lungs at bedtime. Use at night    valACYclovir (VALTREX) 1000 MG tablet 2 tablets twice a day for 1 day as needed for cold sores   No facility-administered medications prior to visit.    Review of Systems  {Labs  Heme  Chem  Endocrine  Serology  Results Review (optional):23779}   Objective    There were no vitals taken for this visit. {Show previous vital signs (optional):23777}  Physical Exam  ***  No results found for any visits on 03/27/22.  Assessment & Plan     ***  No follow-ups on file.      {provider attestation***:1}   Lelon Huh, MD  [facility] [phone] (phone) [phone] (fax)  [place]

## 2022-03-27 ENCOUNTER — Ambulatory Visit: Payer: Medicare Other | Admitting: Family Medicine

## 2022-04-08 DIAGNOSIS — R599 Enlarged lymph nodes, unspecified: Secondary | ICD-10-CM | POA: Diagnosis not present

## 2022-04-08 DIAGNOSIS — R911 Solitary pulmonary nodule: Secondary | ICD-10-CM | POA: Diagnosis not present

## 2022-04-08 DIAGNOSIS — C3490 Malignant neoplasm of unspecified part of unspecified bronchus or lung: Secondary | ICD-10-CM | POA: Diagnosis not present

## 2022-04-13 ENCOUNTER — Ambulatory Visit: Payer: Medicare Other | Admitting: Family Medicine

## 2022-04-13 NOTE — Progress Notes (Deleted)
Established patient visit   Patient: Erica Rivas   DOB: 11-26-1940   81 y.o. Female  MRN: [id number] Visit Date: 04/13/2022  Today's healthcare provider: Lelon Huh, MD   No chief complaint on file.  Subjective    HPI  Depression, Follow-up  She  was last seen for this 2 months ago. Changes made at last visit include; Will try bupropion (WELLBUTRIN XL) 150 MG 24 hr tablet; Take 1 tablet (150 mg total) by mouth daily.  Reassess in a month. Consider increasing dose of trial of SNRI if not feeling better at follow up.  .   She reports {excellent/good/fair/poor:19665} compliance with treatment. She {is/is not:21021397} having side effects. ***  She reports {DESC; GOOD/FAIR/POOR:18685} tolerance of treatment. Current symptoms include: {Symptoms; depression:1002} She feels she is {improved/worse/unchanged:3041574} since last visit.     02/03/2022   11:05 AM 01/08/2022   12:12 PM 10/07/2021   10:54 AM  Depression screen PHQ 2/9  Decreased Interest 3 0 1  Down, Depressed, Hopeless 2 0 2  PHQ - 2 Score 5 0 3  Altered sleeping 3  2  Tired, decreased energy 3  1  Change in appetite 0  0  Feeling bad or failure about yourself  1  0  Trouble concentrating 0  1  Moving slowly or fidgety/restless 0  0  Suicidal thoughts 0  0  PHQ-9 Score 12  7  Difficult doing work/chores Somewhat difficult  Not difficult at all    -----------------------------------------------------------------------------------------   Medications: Outpatient Medications Prior to Visit  Medication Sig   albuterol (PROVENTIL) (2.5 MG/3ML) 0.083% nebulizer solution Take 3 mLs (2.5 mg total) by nebulization 4 (four) times daily as needed for wheezing or shortness of breath (as needed up to 4 times per day.).   albuterol (VENTOLIN HFA) 108 (90 Base) MCG/ACT inhaler INHALE 2 PUFFS BY MOUTH EVERY 6 HOURS AS NEEDED FOR WHEEZING AND FOR SHORTNESS OF BREATH   amLODipine (NORVASC) 5 MG tablet TAKE 1 TABLET BY  MOUTH  DAILY   aspirin EC 81 MG tablet Take 81 mg by mouth daily.   atenolol (TENORMIN) 50 MG tablet TAKE 1 TABLET BY MOUTH  TWICE DAILY   atorvastatin (LIPITOR) 80 MG tablet TAKE 1 TABLET BY MOUTH  DAILY   Budeson-Glycopyrrol-Formoterol (BREZTRI AEROSPHERE) 160-9-4.8 MCG/ACT AERO Inhale 2 puffs into the lungs in the morning and at bedtime.   Budeson-Glycopyrrol-Formoterol (BREZTRI AEROSPHERE) 160-9-4.8 MCG/ACT AERO Inhale 2 puffs into the lungs in the morning and at bedtime.   buPROPion (WELLBUTRIN XL) 150 MG 24 hr tablet Take 1 tablet by mouth once daily   Cholecalciferol (VITAMIN D) 2000 UNITS tablet Take 1,000 Units by mouth daily.    citalopram (CELEXA) 20 MG tablet Take 1 tablet (20 mg total) by mouth every evening.   cyanocobalamin (VITAMIN B12) 1000 MCG tablet Take 1 tablet (1,000 mcg total) by mouth daily.   furosemide (LASIX) 20 MG tablet Take 1 tablet (20 mg total) by mouth daily as needed (swelling).   glipiZIDE (GLUCOTROL XL) 10 MG 24 hr tablet Take 1 tablet (10 mg total) by mouth daily.   glucose blood (ONETOUCH ULTRA) test strip USE 1 STRIP TO CHECK GLUCOSE ONCE DAILY   hydrOXYzine (ATARAX) 25 MG tablet Take 12.5 mg by mouth at bedtime.   Iron-FA-B Cmp-C-Biot-Probiotic (FUSION PLUS) CAPS Take 1 capsule by mouth daily.   lipase/protease/amylase (CREON) 36000 UNITS CPEP capsule Take 2 capsules with the first bite of each meal and 1  capsule with the first bite of each snack   lisinopril-hydrochlorothiazide (ZESTORETIC) 20-25 MG tablet TAKE 1 TABLET BY MOUTH  DAILY   LORazepam (ATIVAN) 1 MG tablet Take 1 mg by mouth daily as needed.   metFORMIN (GLUCOPHAGE) 1000 MG tablet TAKE 1 TABLET BY MOUTH  TWICE DAILY   montelukast (SINGULAIR) 10 MG tablet TAKE 1 TABLET BY MOUTH  DAILY   omeprazole (PRILOSEC) 40 MG capsule Take 40 mg by mouth daily.   OneTouch Delica Lancets 45T MISC USE TO CHECK BLOOD SUGAR DAILY   OXYGEN Inhale 2 L into the lungs at bedtime. Use at night   valACYclovir  (VALTREX) 1000 MG tablet 2 tablets twice a day for 1 day as needed for cold sores   No facility-administered medications prior to visit.    Review of Systems  Constitutional:  Negative for appetite change, chills, fatigue and fever.  Respiratory:  Negative for chest tightness and shortness of breath.   Cardiovascular:  Negative for chest pain and palpitations.  Gastrointestinal:  Negative for abdominal pain, nausea and vomiting.  Neurological:  Negative for dizziness and weakness.    {Labs  Heme  Chem  Endocrine  Serology  Results Review (optional):23779}   Objective    There were no vitals taken for this visit. {Show previous vital signs (optional):23777}  Physical Exam  ***  No results found for any visits on 04/13/22.  Assessment & Plan     ***  No follow-ups on file.      {provider attestation***:1}   Lelon Huh, MD  [facility] [phone] (phone) [phone] (fax)  [place]

## 2022-04-15 NOTE — Telephone Encounter (Signed)
Called to check status of the patient assistance form they said the HIPAA box was not checked and the patient strength of medication. Check these boxes and fax back to the [place] patient assistance

## 2022-04-18 ENCOUNTER — Other Ambulatory Visit: Payer: Self-pay | Admitting: Family Medicine

## 2022-04-18 DIAGNOSIS — F430 Acute stress reaction: Secondary | ICD-10-CM

## 2022-04-21 ENCOUNTER — Other Ambulatory Visit: Payer: Self-pay | Admitting: Family Medicine

## 2022-04-21 NOTE — Telephone Encounter (Signed)
Refilled 05/26/2021 #[id number] rf Requested Prescriptions  Pending Prescriptions Disp Refills   metFORMIN (GLUCOPHAGE) 1000 MG tablet [Pharmacy Med Name: metFORMIN HCl 1000 MG Oral Tablet] 160 tablet 3    Sig: TAKE 1 TABLET BY MOUTH TWICE  DAILY     Endocrinology:  Diabetes - Biguanides Failed - 04/21/2022  5:37 AM      Failed - Cr in normal range and within 360 days    Creatinine, Ser  Date Value Ref Range Status  03/23/2022 1.60 (H) 0.44 - 1.00 mg/dL Final   Creatinine, POC  Date Value Ref Range Status  07/13/2016 n/a mg/dL Final         Failed - eGFR in normal range and within 360 days    GFR calc Af Amer  Date Value Ref Range Status  11/14/2019 45 (L) >60 mL/min Final   GFR calc non Af Amer  Date Value Ref Range Status  11/14/2019 39 (L) >60 mL/min Final   eGFR  Date Value Ref Range Status  04/02/2021 46  Final  01/31/2021 46 (L) >59 mL/min/1.73 Final         Failed - B12 Level in normal range and within 720 days    Vitamin B-12  Date Value Ref Range Status  01/23/2022 216 (L) 232 - 1,245 pg/mL Final         Failed - CBC within normal limits and completed in the last 12 months    WBC  Date Value Ref Range Status  01/23/2022 10.8 3.4 - 10.8 x10E3/uL Final  11/14/2019 11.7 (H) 4.0 - 10.5 K/uL Final   RBC  Date Value Ref Range Status  01/23/2022 3.49 (L) 3.77 - 5.28 x10E6/uL Final  11/14/2019 3.19 (L) 3.87 - 5.11 MIL/uL Final   Hemoglobin  Date Value Ref Range Status  01/23/2022 9.4 (L) 11.1 - 15.9 g/dL Final   Hematocrit  Date Value Ref Range Status  01/23/2022 28.9 (L) 34.0 - 46.6 % Final   MCHC  Date Value Ref Range Status  01/23/2022 32.5 31.5 - 35.7 g/dL Final  11/14/2019 32.7 30.0 - 36.0 g/dL Final   [facility]  Date Value Ref Range Status  01/23/2022 26.9 26.6 - 33.0 pg Final  11/14/2019 28.2 26.0 - 34.0 pg Final   MCV  Date Value Ref Range Status  01/23/2022 83 79 - 97 fL Final   No results found for: "PLTCOUNTKUC", "LABPLAT", "POCPLA" RDW  Date  Value Ref Range Status  01/23/2022 14.4 11.7 - 15.4 % Final         Passed - HBA1C is between 0 and 7.9 and within 180 days    Hemoglobin A1C  Date Value Ref Range Status  02/03/2022 6.8 (A) 4.0 - 5.6 % Final   Hgb A1c MFr Bld  Date Value Ref Range Status  06/04/2021 7.7 (H) 4.8 - 5.6 % Final    Comment:             Prediabetes: 5.7 - 6.4          Diabetes: >6.4          Glycemic control for adults with diabetes: <7.0          Passed - Valid encounter within last 6 months    Recent Outpatient Visits           2 months ago Type 2 diabetes mellitus with diabetic neuropathy, without long-term current use of insulin [facility])   [facility] Birdie Sons, MD   6 months  ago Type 2 diabetes mellitus with diabetic neuropathy, without long-term current use of insulin [facility])   [facility] Birdie Sons, MD   7 months ago Cough   [facility] Birdie Sons, MD   10 months ago Malignant melanoma of right forearm [facility])   [facility] Birdie Sons, MD   10 months ago Iron deficiency anemia, unspecified iron deficiency anemia type   [facility] Birdie Sons, MD       Future Appointments             In 3 days Fisher, Kirstie Peri, MD [facility] [place]   In 6 days Vanga, Tally Due, MD [place]   In 4 weeks Ralene Bathe, MD [place]

## 2022-04-23 NOTE — Progress Notes (Deleted)
Established patient visit   Patient: Erica Rivas   DOB: 1940/08/15   81 y.o. Female  MRN: [id number] Visit Date: 04/24/2022  Today's healthcare provider: Lelon Huh, MD   No chief complaint on file.  Subjective    HPI  Diabetes Mellitus Type II, follow-up  Lab Results  Component Value Date   HGBA1C 6.8 (A) 02/03/2022   HGBA1C 7.4 (A) 10/07/2021   HGBA1C 7.7 (H) 06/04/2021   Last seen for diabetes 2 1/2 months ago.  Management since then includes continuing the same treatment.  Home blood sugar records: {diabetes glucometry results:16657} Most Recent Eye Exam: ***  --------------------------------------------------------------------------------------------------- Hypertension, follow-up  BP Readings from Last 3 Encounters:  03/18/22 110/60  02/18/22 (!) 124/53  02/03/22 (!) 129/52   Wt Readings from Last 3 Encounters:  03/18/22 153 lb 9.6 oz (69.7 kg)  02/18/22 158 lb 5 oz (71.8 kg)  02/03/22 157 lb (71.2 kg)     She was last seen for hypertension 2 1/2 months ago.  Management since that visit includes continue current medications. She reports {excellent/good/fair/poor:19665} compliance with treatment. She {is/is not:9024} having side effects. {document side effects if present:1} She {is/is not:9024} exercising. She {is/is not:9024} adherent to low salt diet.   Outside blood pressures are {enter patient reported home BP, or 'not being checked':1}.  She {does/does not:200015} smoke.  Use of agents associated with hypertension: {bp agents assoc with hypertension:511::"none"}.   --------------------------------------------------------------------------------------------------- Lipid/Cholesterol, follow-up  Last Lipid Panel: Lab Results  Component Value Date   CHOL 140 08/08/2020   LDLCALC 67 08/08/2020   HDL 56 08/08/2020   TRIG 90 08/08/2020    She was last seen for this 08/08/2020 .  Management since that visit includes; taking atorvastatin  80 mg.  Last metabolic panel Lab Results  Component Value Date   GLUCOSE 163 (H) 01/31/2021   NA 138 01/31/2021   K 4.6 01/31/2021   BUN 17 01/31/2021   CREATININE 1.60 (H) 03/23/2022   EGFR 46 04/02/2021   GFRNONAA 39 (L) 11/14/2019   CALCIUM 10.2 01/31/2021   AST 18 01/31/2021   ALT 15 01/31/2021   The 10-year ASCVD risk score (Arnett DK, et al., 2019) is: 52.1%  ---------------------------------------------------------------------------------------------------   Medications: Outpatient Medications Prior to Visit  Medication Sig   albuterol (PROVENTIL) (2.5 MG/3ML) 0.083% nebulizer solution Take 3 mLs (2.5 mg total) by nebulization 4 (four) times daily as needed for wheezing or shortness of breath (as needed up to 4 times per day.).   albuterol (VENTOLIN HFA) 108 (90 Base) MCG/ACT inhaler INHALE 2 PUFFS BY MOUTH EVERY 6 HOURS AS NEEDED FOR WHEEZING AND FOR SHORTNESS OF BREATH   amLODipine (NORVASC) 5 MG tablet TAKE 1 TABLET BY MOUTH  DAILY   aspirin EC 81 MG tablet Take 81 mg by mouth daily.   atenolol (TENORMIN) 50 MG tablet TAKE 1 TABLET BY MOUTH  TWICE DAILY   atorvastatin (LIPITOR) 80 MG tablet TAKE 1 TABLET BY MOUTH  DAILY   Budeson-Glycopyrrol-Formoterol (BREZTRI AEROSPHERE) 160-9-4.8 MCG/ACT AERO Inhale 2 puffs into the lungs in the morning and at bedtime.   Budeson-Glycopyrrol-Formoterol (BREZTRI AEROSPHERE) 160-9-4.8 MCG/ACT AERO Inhale 2 puffs into the lungs in the morning and at bedtime.   buPROPion (WELLBUTRIN XL) 150 MG 24 hr tablet Take 1 tablet by mouth once daily   Cholecalciferol (VITAMIN D) 2000 UNITS tablet Take 1,000 Units by mouth daily.    citalopram (CELEXA) 20 MG tablet TAKE 1 TABLET BY MOUTH ONCE DAILY  IN THE EVENING   cyanocobalamin (VITAMIN B12) 1000 MCG tablet Take 1 tablet (1,000 mcg total) by mouth daily.   furosemide (LASIX) 20 MG tablet Take 1 tablet (20 mg total) by mouth daily as needed (swelling).   glipiZIDE (GLUCOTROL XL) 10 MG 24 hr tablet  Take 1 tablet (10 mg total) by mouth daily.   glucose blood (ONETOUCH ULTRA) test strip USE 1 STRIP TO CHECK GLUCOSE ONCE DAILY   hydrOXYzine (ATARAX) 25 MG tablet Take 12.5 mg by mouth at bedtime.   Iron-FA-B Cmp-C-Biot-Probiotic (FUSION PLUS) CAPS Take 1 capsule by mouth daily.   lipase/protease/amylase (CREON) 36000 UNITS CPEP capsule Take 2 capsules with the first bite of each meal and 1 capsule with the first bite of each snack   lisinopril-hydrochlorothiazide (ZESTORETIC) 20-25 MG tablet TAKE 1 TABLET BY MOUTH  DAILY   LORazepam (ATIVAN) 1 MG tablet Take 1 mg by mouth daily as needed.   metFORMIN (GLUCOPHAGE) 1000 MG tablet TAKE 1 TABLET BY MOUTH  TWICE DAILY   montelukast (SINGULAIR) 10 MG tablet TAKE 1 TABLET BY MOUTH  DAILY   omeprazole (PRILOSEC) 40 MG capsule Take 40 mg by mouth daily.   OneTouch Delica Lancets 63F MISC USE TO CHECK BLOOD SUGAR DAILY   OXYGEN Inhale 2 L into the lungs at bedtime. Use at night   valACYclovir (VALTREX) 1000 MG tablet 2 tablets twice a day for 1 day as needed for cold sores   No facility-administered medications prior to visit.    Review of Systems  Constitutional:  Negative for appetite change, chills, fatigue and fever.  Respiratory:  Negative for chest tightness and shortness of breath.   Cardiovascular:  Negative for chest pain and palpitations.  Gastrointestinal:  Negative for abdominal pain, nausea and vomiting.  Neurological:  Negative for dizziness and weakness.    {Labs  Heme  Chem  Endocrine  Serology  Results Review (optional):23779}   Objective    There were no vitals taken for this visit. {Show previous vital signs (optional):23777}  Physical Exam  ***  No results found for any visits on 04/24/22.  Assessment & Plan     ***  No follow-ups on file.      {provider attestation***:1}   Lelon Huh, MD  [facility] [phone] (phone) [phone] (fax)  [place]

## 2022-04-24 ENCOUNTER — Ambulatory Visit: Payer: Medicare Other | Admitting: Family Medicine

## 2022-04-25 DIAGNOSIS — J449 Chronic obstructive pulmonary disease, unspecified: Secondary | ICD-10-CM | POA: Diagnosis not present

## 2022-04-27 ENCOUNTER — Ambulatory Visit: Payer: Medicare Other | Admitting: Gastroenterology

## 2022-04-27 ENCOUNTER — Encounter: Payer: Self-pay | Admitting: Gastroenterology

## 2022-04-27 VITALS — BP 113/66 | HR 73 | Temp 98.6°F | Ht 67.0 in | Wt 154.1 lb

## 2022-04-27 DIAGNOSIS — K8689 Other specified diseases of pancreas: Secondary | ICD-10-CM | POA: Diagnosis not present

## 2022-04-27 DIAGNOSIS — D509 Iron deficiency anemia, unspecified: Secondary | ICD-10-CM | POA: Diagnosis not present

## 2022-04-27 NOTE — Telephone Encounter (Signed)
Called to check status of patient application and they said the application was still under review they said give a couple more days and decision should be made

## 2022-04-27 NOTE — Progress Notes (Signed)
[name], MD [address]  [place]  [place], [place] [address]  Main: [phone]  Fax: [phone]    Gastroenterology Consultation  Referring Provider:     Birdie Sons, MD Primary Care Physician:  Birdie Sons, MD Primary Gastroenterologist:  Dr. [name] Reason for Consultation: Chronic iron deficiency anemia, exocrine pancreatic insufficiency        HPI:   Erica Rivas is a 81 y.o. female referred by Dr. Birdie Sons, MD  for consultation & management of chronic diarrhea and abdominal bloating.  Patient reports more than 1 year history of progressively worsening nonbloody diarrhea, postprandial, 5-6 times daily associated with significant abdominal bloating.  She takes Imodium as needed which makes her bloating worse and feels constipated.  She missed going to church on Sundays due to her GI symptoms.  She denies any nocturnal diarrhea, did have episodes of fecal incontinence.  She denies any rectal bleeding, weight loss or loss of appetite.  Patient also has history of chronic iron deficiency anemia, underwent EGD and colonoscopy in 2019 which were unremarkable.  At that time, I recommended we do capsule endoscopy and she deferred.  She did not follow-up with me again.  Her recent labs from January 2023 revealed persistent severe iron deficiency anemia, she also has mild B12 deficiency.  Hemoglobin 9.5, MCV 82, serum ferritin 14. Patient does state that she eats fatty foods on a regular basis, drinks [facility], coffee with creamer and sugar daily.  Patient is accompanied by her husband.  She loves eating red meat as well  She has history of chronic tobacco use, 120 pack years of smoking history, smokes half pack per day currently  Follow-up visit 06/28/2021 Patient is here for follow-up of chronic nonbloody diarrhea with abdominal bloating and weight loss, chronic iron deficiency anemia.  Workup thus far included GI profile PCR which was positive  for E. coli and norovirus.  E. coli was treated with azithromycin.  Her pancreatic fecal elastase levels were also low.  She did have severe iron and B12 deficiency.  Antiparietal cell antibodies, intrinsic factor antibodies were negative.  Was positive for E. coli and norovirus.  E. coli was treated with azithromycin.  Her pancreatic fecal elastase levels were also low.  She did have severe iron and B12 deficiency.  Antiparietal cell antibodies, intrinsic factor antibodies were negative.  Subsequently, underwent upper endoscopy and colonoscopy which were unremarkable.  Due to ongoing diarrhea despite treating her infection, repeated stool studies which were negative for infection.  Her pancreatic fecal elastase levels were still low.  Recommended Creon, patient could not afford, filed for patient assistance program which is pending.  She is taking iron supplements as well as B12 supplements.  Her diarrhea is fairly controlled on Imodium 1 to 2 pills daily.  Patient is accompanied by her husband today.  Her weight has been stable.  NSAIDs: None  Antiplts/Anticoagulants/Anti thrombotics: None  GI Procedures:  EGD and colonoscopy 02/18/2022  - Normal duodenal bulb and second portion of the duodenum. Biopsied. - Normal stomach. Biopsied. - Normal gastroesophageal junction and esophagus.  - The examined portion of the ileum was normal. - Normal mucosa in the left colon and in the right colon. Biopsied. - The distal rectum and anal verge are normal on retroflexion view.  DIAGNOSIS: A. DUODENUM; COLD BIOPSY: - ENTERIC MUCOSA WITH PRESERVED VILLOUS ARCHITECTURE AND NO SIGNIFICANT HISTOPATHOLOGIC CHANGE. - NEGATIVE FOR FEATURES OF CELIAC, DYSPLASIA, AND MALIGNANCY.  B. STOMACH, RANDOM; COLD BIOPSY: - GASTRIC ANTRAL AND OXYNTIC MUCOSA WITH FEATURES OF REACTIVE GASTROPATHY. - MINUTE FOCUS OF INTESTINAL METAPLASIA IS PRESENT. - NEGATIVE FOR H. PYLORI, DYSPLASIA, AND MALIGNANCY.  C. COLON, RANDOM  RIGHT; COLD BIOPSY: - BENIGN COLONIC MUCOSA WITH NO SIGNIFICANT HISTOPATHOLOGIC CHANGE. - NEGATIVE FOR ACTIVE MUCOSAL COLITIS AND FEATURES OF MICROSCOPIC COLITIS. - NEGATIVE FOR DYSPLASIA AND MALIGNANCY.  D. COLON, RANDOM LEFT; COLD BIOPSY: - BENIGN COLONIC MUCOSA WITH NO SIGNIFICANT HISTOPATHOLOGIC CHANGE. - NEGATIVE FOR ACTIVE MUCOSAL COLITIS AND FEATURES OF MICROSCOPIC COLITIS. - NEGATIVE FOR DYSPLASIA AND MALIGNANCY.    EGD and colonoscopy 01/18/2018 - Normal duodenal bulb and second portion of the duodenum. Biopsied. - Normal stomach. Biopsied. - 1 cm hiatal hernia. - Normal gastroesophageal junction and esophagus.  - The examined portion of the ileum was normal. - The entire examined colon is normal. - Non-bleeding external hemorrhoids. - No specimens collected.  DIAGNOSIS:  A. DUODENUM; COLD BIOPSY:  - MILD CHRONIC DUODENITIS, NON-SPECIFIC.  - NEGATIVE FOR FEATURES OF CELIAC SPRUE, DYSPLASIA, AND MALIGNANCY.   B.  STOMACH; COLD BIOPSY:  - ANTRAL MUCOSA WITH MILD REACTIVE GASTRITIS AND FOCAL INTESTINAL  METAPLASIA.  - OXYNTIC MUCOSA WITH MILD CHRONIC NON-SPECIFIC GASTRITIS.  - NEGATIVE FOR H. PYLORI, DYSPLASIA, AND MALIGNANCY.   Past Medical History:  Diagnosis Date   Actinic keratosis    Anemia    Colon polyps    Depressive disorder 07/08/2007   Diabetes mellitus without complication (HCC)    GERD (gastroesophageal reflux disease)    Hemorrhoids    History of chicken pox    History of measles    History of mumps    Hypertension    Lung nodule 02/18/2015   No additional follow up needed per CT report 03/05/2015    Melanoma ([place]) 08/14/2020   right forearm, excised 09/17/2020 [facility]   SCC (squamous cell carcinoma), hand    excision SCC left forearm and left hand 09/05/21 Dr. 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   L pretibial - ED&C    Squamous cell carcinoma of skin 05/23/2019   L calf - ED&C     Past Surgical History:  Procedure  Laterality Date   ABDOMINAL HYSTERECTOMY  1985   DUB; ovaries resected/ removed   BREAST BIOPSY Right 11/12/2014   fibroadenomatous   CARDIOVASCULAR STRESS TEST  02/2008   low risk   CAROTID PTA/STENT INTERVENTION Right 11/13/2019   Procedure: CAROTID PTA/STENT INTERVENTION;  Surgeon: Algernon Huxley, MD;  Location: [facility] [facility];  Service: Cardiovascular;  Laterality: Right;   COLONOSCOPY WITH PROPOFOL N/A 01/18/2018   Procedure: COLONOSCOPY WITH PROPOFOL;  Surgeon: Lin Landsman, MD;  Location: [facility] ENDOSCOPY;  Service: Gastroenterology;  Laterality: N/A;   COLONOSCOPY WITH PROPOFOL N/A 02/18/2022   Procedure: COLONOSCOPY WITH PROPOFOL;  Surgeon: Lin Landsman, MD;  Location: [facility] ENDOSCOPY;  Service: Gastroenterology;  Laterality: N/A;   DOPPLER ECHOCARDIOGRAPHY  01/2010   EF>55%, LV relaxation impairment consistent with diastolic dysfunction, mild TR and MR   ESOPHAGOGASTRODUODENOSCOPY  07/2009   +diffuse gastritis. I[facility]   ESOPHAGOGASTRODUODENOSCOPY (EGD) WITH PROPOFOL N/A 01/18/2018   Procedure: ESOPHAGOGASTRODUODENOSCOPY (EGD) WITH PROPOFOL;  Surgeon: Lin Landsman, MD;  Location: [facility] ENDOSCOPY;  Service: Gastroenterology;  Laterality: N/A;   ESOPHAGOGASTRODUODENOSCOPY (EGD) WITH PROPOFOL N/A 02/18/2022   Procedure: ESOPHAGOGASTRODUODENOSCOPY (EGD) WITH PROPOFOL;  Surgeon: Lin Landsman, MD;  Location: [facility] ENDOSCOPY;  Service: Gastroenterology;  Laterality: N/A;   [place]  HAND SURGERY Right 2010   surgery x 2. Dr. Sabra Heck   MELANOMA EXCISION Right 09/17/2020   Wide excision r forearm Dr. Arrie Aran, [facility]   NECK SURGERY  1998   Disc surgey on neck   Sinus Surgery     TUBAL LIGATION  1971     Current Outpatient Medications:    albuterol (PROVENTIL) (2.5 MG/3ML) 0.083% nebulizer solution, Take 3 mLs (2.5 mg total) by nebulization 4 (four) times daily as needed for wheezing or shortness of breath (as needed up to 4  times per day.)., Disp: 120 mL, Rfl: 3   albuterol (VENTOLIN HFA) 108 (90 Base) MCG/ACT inhaler, INHALE 2 PUFFS BY MOUTH EVERY 6 HOURS AS NEEDED FOR WHEEZING AND FOR SHORTNESS OF BREATH, Disp: 9 g, Rfl: 5   amLODipine (NORVASC) 5 MG tablet, TAKE 1 TABLET BY MOUTH  DAILY, Disp: 90 tablet, Rfl: 3   aspirin EC 81 MG tablet, Take 81 mg by mouth daily., Disp: , Rfl:    atenolol (TENORMIN) 50 MG tablet, TAKE 1 TABLET BY MOUTH  TWICE DAILY, Disp: 180 tablet, Rfl: 3   atorvastatin (LIPITOR) 80 MG tablet, TAKE 1 TABLET BY MOUTH  DAILY, Disp: 90 tablet, Rfl: 3   Budeson-Glycopyrrol-Formoterol (BREZTRI AEROSPHERE) 160-9-4.8 MCG/ACT AERO, Inhale 2 puffs into the lungs in the morning and at bedtime., Disp: 10.7 g, Rfl: 0   Budeson-Glycopyrrol-Formoterol (BREZTRI AEROSPHERE) 160-9-4.8 MCG/ACT AERO, Inhale 2 puffs into the lungs in the morning and at bedtime., Disp: 5.9 g, Rfl: 0   buPROPion (WELLBUTRIN XL) 150 MG 24 hr tablet, Take 1 tablet by mouth once daily, Disp: 30 tablet, Rfl: 5   Cholecalciferol (VITAMIN D) 2000 UNITS tablet, Take 1,000 Units by mouth daily. , Disp: , Rfl:    citalopram (CELEXA) 20 MG tablet, TAKE 1 TABLET BY MOUTH ONCE DAILY IN THE EVENING, Disp: 90 tablet, Rfl: 3   cyanocobalamin (VITAMIN B12) 1000 MCG tablet, Take 1 tablet (1,000 mcg total) by mouth daily., Disp: 30 tablet, Rfl: 5   furosemide (LASIX) 20 MG tablet, Take 1 tablet (20 mg total) by mouth daily as needed (swelling)., Disp: 20 tablet, Rfl: 4   glipiZIDE (GLUCOTROL XL) 10 MG 24 hr tablet, Take 1 tablet (10 mg total) by mouth daily., Disp: 90 tablet, Rfl: 0   glucose blood (ONETOUCH ULTRA) test strip, USE 1 STRIP TO CHECK GLUCOSE ONCE DAILY, Disp: 50 each, Rfl: 4   hydrOXYzine (ATARAX) 25 MG tablet, Take 12.5 mg by mouth at bedtime., Disp: , Rfl:    Iron-FA-B Cmp-C-Biot-Probiotic (FUSION PLUS) CAPS, Take 1 capsule by mouth daily., Disp: 30 capsule, Rfl: 5   lipase/protease/amylase (CREON) 36000 UNITS CPEP capsule, Take 2  capsules with the first bite of each meal and 1 capsule with the first bite of each snack, Disp: 240 capsule, Rfl: 5   lisinopril-hydrochlorothiazide (ZESTORETIC) 20-25 MG tablet, TAKE 1 TABLET BY MOUTH  DAILY, Disp: 90 tablet, Rfl: 3   LORazepam (ATIVAN) 1 MG tablet, Take 1 mg by mouth daily as needed., Disp: , Rfl:    metFORMIN (GLUCOPHAGE) 1000 MG tablet, TAKE 1 TABLET BY MOUTH  TWICE DAILY, Disp: 180 tablet, Rfl: 3   montelukast (SINGULAIR) 10 MG tablet, TAKE 1 TABLET BY MOUTH  DAILY, Disp: 100 tablet, Rfl: 3   omeprazole (PRILOSEC) 40 MG capsule, Take 40 mg by mouth daily., Disp: , Rfl:    OneTouch Delica Lancets 93Y MISC, USE TO CHECK BLOOD SUGAR DAILY, Disp: 100 each, Rfl: 3   OXYGEN,  Inhale 2 L into the lungs at bedtime. Use at night, Disp: , Rfl:    valACYclovir (VALTREX) 1000 MG tablet, 2 tablets twice a day for 1 day as needed for cold sores, Disp: 12 tablet, Rfl: 2   Family History  Problem Relation Age of Onset   Hypertension Sister    COPD Sister    Thyroid disease Sister    Breast cancer Sister 37   Thyroid disease Sister    Heart attack Mother    Hypertension Mother    Thyroid disease Mother        HYPERTHYROIDISM   GI Bleed Father    Lung cancer Brother    Breast cancer Sister    Hypertension Sister    Aneurysm Sister        in the back of her neck   Heart attack Brother        CABG     Social History   Tobacco Use   Smoking status: Every Day    Packs/day: 2.00    Years: 60.00    Total pack years: 120.00    Types: Cigarettes   Smokeless tobacco: Never   Tobacco comments:    7-8 cigarettes daily-11/25/2021    10 Cigarettes daily- 03/18/2022 khj  Vaping Use   Vaping Use: Never used  Substance Use Topics   Alcohol use: No    Alcohol/week: 0.0 standard drinks of alcohol   Drug use: No    Allergies as of 04/27/2022 - Review Complete 03/18/2022  Allergen Reaction Noted   Bupropion Other (See Comments) 04/22/2021    Review of Systems:    All systems  reviewed and negative except where noted in HPI.   Physical Exam:  BP 113/66 (BP Location: Left Arm, Patient Position: Sitting, Cuff Size: Normal)   Pulse 73   Temp 98.6 F (37 C) (Oral)   Ht 5\' 7"  (1.702 m)   Wt 154 lb 2 oz (69.9 kg)   BMI 24.14 kg/m  No LMP recorded. Patient has had a hysterectomy.  General:   Alert,  Well-developed, well-nourished, pleasant and cooperative in NAD Head:  Normocephalic and atraumatic. Eyes:  Sclera clear, no icterus.   Conjunctiva pink. Ears:  Normal auditory acuity. Nose:  No deformity, discharge, or lesions. Mouth:  No deformity or lesions,oropharynx pink & moist. Neck:  Supple; no masses or thyromegaly. Lungs:  Respirations even and unlabored.  Clear throughout to auscultation.   No wheezes, crackles, or rhonchi. No acute distress. Heart:  Regular rate and rhythm; no murmurs, clicks, rubs, or gallops. Abdomen:  Normal bowel sounds. Soft, non-tender and grossly distended, tympanic to percussion without masses, hepatosplenomegaly or hernias noted.  No guarding or rebound tenderness.   Rectal: Not performed Msk:  Symmetrical without gross deformities. Good, equal movement & strength bilaterally. Pulses:  Normal pulses noted. Extremities:  No clubbing or edema.  No cyanosis. Neurologic:  Alert and oriented x3;  grossly normal neurologically. Skin:  Intact without significant lesions or rashes. No jaundice. Psych:  Alert and cooperative. Normal mood and affect.  Imaging Studies: Reviewed  Assessment and Plan:   Erica Rivas is a 81 y.o. female with history of chronic tobacco use, history of melanoma, chronic iron deficiency anemia of unclear etiology is seen in consultation for chronic unexplained diarrhea and abdominal bloating.  Workup thus far has revealed E. 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  Given samples today while patient waiting for patient assistance program Discussed about low-fat, low-carb, high-protein, small portions meal  Chronic unexplained iron deficiency anemia EGD and colonoscopy 2019 were unremarkable Repeat EGD and colonoscopy were unremarkable, recommend video capsule endoscopy if patient does not respond to iron and B12 replacement therapy intrinsic factor and parietal cell antibodies were negative Recheck CBC, iron panel, B12 and folate levels Continue fusion plus   Follow up in 6 months   [name], MD

## 2022-04-28 ENCOUNTER — Telehealth: Payer: Self-pay

## 2022-04-28 LAB — CBC
Hematocrit: 28.5 % — ABNORMAL LOW (ref 34.0–46.6)
Hemoglobin: 9.1 g/dL — ABNORMAL LOW (ref 11.1–15.9)
MCH: 27.5 pg (ref 26.6–33.0)
MCHC: 31.9 g/dL (ref 31.5–35.7)
MCV: 86 fL (ref 79–97)
Platelets: 277 10*3/uL (ref 150–450)
RBC: 3.31 x10E6/uL — ABNORMAL LOW (ref 3.77–5.28)
RDW: 14.6 % (ref 11.7–15.4)
WBC: 10.2 10*3/uL (ref 3.4–10.8)

## 2022-04-28 LAB — IRON,TIBC AND FERRITIN PANEL
Ferritin: 47 ng/mL (ref 15–150)
Iron Saturation: 15 % (ref 15–55)
Iron: 47 ug/dL (ref 27–139)
Total Iron Binding Capacity: 322 ug/dL (ref 250–450)
UIBC: 275 ug/dL (ref 118–369)

## 2022-04-28 LAB — B12 AND FOLATE PANEL
Folate: >20 ng/mL (ref >3.0)
Vitamin B-12: >2000 pg/mL — ABNORMAL HIGH (ref 232–1245)

## 2022-04-28 NOTE — Telephone Encounter (Signed)
-----   Message from Lin Landsman, MD sent at 04/28/2022 11:32 AM EST ----- Erica Rivas  Please inform patient that her anemia is stable.  Her iron levels are improving.  She should continue taking iron supplements for another 3 months before we recheck her CBC and iron panel  RV

## 2022-04-28 NOTE — Telephone Encounter (Signed)
Called and patient verbalized understanding of results. Put in a reminder in 3 months to recheck labs work

## 2022-04-29 DIAGNOSIS — E119 Type 2 diabetes mellitus without complications: Secondary | ICD-10-CM | POA: Diagnosis not present

## 2022-04-29 LAB — HM DIABETES EYE EXAM

## 2022-05-05 ENCOUNTER — Other Ambulatory Visit: Payer: Self-pay | Admitting: Family Medicine

## 2022-05-05 DIAGNOSIS — I6521 Occlusion and stenosis of right carotid artery: Secondary | ICD-10-CM

## 2022-05-05 DIAGNOSIS — I10 Essential (primary) hypertension: Secondary | ICD-10-CM

## 2022-05-05 NOTE — Telephone Encounter (Signed)
Unable to refill per protocol, requests are too soon. Last refills were for 90 and 3 additional refills. Will refuse.  Requested Prescriptions  Pending Prescriptions Disp Refills   atenolol (TENORMIN) 50 MG tablet [Pharmacy Med Name: Atenolol 50 MG Oral Tablet] 200 tablet 2    Sig: TAKE 1 TABLET BY MOUTH TWICE  DAILY     Cardiovascular: Beta Blockers 2 Failed - 05/05/2022  5:28 AM      Failed - Cr in normal range and within 360 days    Creatinine, Ser  Date Value Ref Range Status  03/23/2022 1.60 (H) 0.44 - 1.00 mg/dL Final   Creatinine, POC  Date Value Ref Range Status  07/13/2016 n/a mg/dL Final         Passed - Last BP in normal range    BP Readings from Last 1 Encounters:  04/27/22 113/66         Passed - Last Heart Rate in normal range    Pulse Readings from Last 1 Encounters:  04/27/22 73         Passed - Valid encounter within last 6 months    Recent Outpatient Visits           3 months ago Type 2 diabetes mellitus with diabetic neuropathy, without long-term current use of insulin ([place])   [facility] Birdie Sons, MD   7 months ago Type 2 diabetes mellitus with diabetic neuropathy, without long-term current use of insulin ([place])   [facility] Birdie Sons, MD   8 months ago Cough   [facility] Birdie Sons, MD   10 months ago Malignant melanoma of right forearm [facility])   [facility] Birdie Sons, MD   11 months ago Iron deficiency anemia, unspecified iron deficiency anemia type   [facility] Birdie Sons, MD       Future Appointments             In 2 weeks Ralene Bathe, MD [place]   In 2 weeks Fisher, Kirstie Peri, MD [facility]             atorvastatin (LIPITOR) 80 MG tablet [Pharmacy Med Name: Atorvastatin Calcium 80 MG Oral Tablet] 100 tablet 2    Sig: TAKE 1 TABLET BY MOUTH ONCE  DAILY     Cardiovascular:  Antilipid -  Statins Failed - 05/05/2022  5:28 AM      Failed - Lipid Panel in normal range within the last 12 months    Cholesterol, Total  Date Value Ref Range Status  08/08/2020 140 100 - 199 mg/dL Final   LDL Chol Calc (NIH)  Date Value Ref Range Status  08/08/2020 67 0 - 99 mg/dL Final   HDL  Date Value Ref Range Status  08/08/2020 56 >39 mg/dL Final   Triglycerides  Date Value Ref Range Status  08/08/2020 90 0 - 149 mg/dL Final         Passed - Patient is not pregnant      Passed - Valid encounter within last 12 months    Recent Outpatient Visits           3 months ago Type 2 diabetes mellitus with diabetic neuropathy, without long-term current use of insulin [facility])   [facility] Birdie Sons, MD   7 months ago Type 2 diabetes mellitus with diabetic neuropathy, without long-term current use of insulin ([place])   [facility] Birdie Sons, MD   8 months ago Cough   [facility] Birdie Sons, MD   10 months ago Malignant melanoma of right forearm [facility])   [facility] Birdie Sons, MD   11 months ago Iron deficiency anemia, unspecified iron deficiency anemia type   [facility] Birdie Sons, MD       Future Appointments             In 2 weeks Ralene Bathe, MD [place]   In 2 weeks Fisher, Kirstie Peri, MD [facility]             amLODipine (NORVASC) 5 MG tablet [Pharmacy Med Name: amLODIPine Besylate 5 MG Oral Tablet] 100 tablet 2    Sig: TAKE 1 TABLET BY MOUTH DAILY     Cardiovascular: Calcium Channel Blockers 2 Passed - 05/05/2022  5:28 AM      Passed - Last BP in normal range    BP Readings from Last 1 Encounters:  04/27/22 113/66         Passed - Last Heart Rate in normal range    Pulse Readings from Last 1 Encounters:  04/27/22 73         Passed - Valid encounter within last 6 months    Recent Outpatient Visits           3 months ago  Type 2 diabetes mellitus with diabetic neuropathy, without long-term current use of insulin ([place])   [facility] Birdie Sons, MD   7 months ago Type 2 diabetes mellitus with diabetic neuropathy, without long-term current use of insulin ([place])   [facility] Birdie Sons, MD   8 months ago Cough   [facility] Birdie Sons, MD   10 months ago Malignant melanoma of right forearm [facility])   [facility] Birdie Sons, MD   11 months ago Iron deficiency anemia, unspecified iron deficiency anemia type   [facility] Birdie Sons, MD       Future Appointments             In 2 weeks Ralene Bathe, MD [place]   In 2 weeks Caryn Section, Kirstie Peri, MD [facility], [facility]             omeprazole (PRILOSEC) 40 MG capsule [Pharmacy Med Name: Omeprazole 40 MG Oral Capsule Delayed Release] 100 capsule 2    Sig: TAKE 1 CAPSULE BY MOUTH DAILY     Gastroenterology: Proton Pump Inhibitors Passed - 05/05/2022  5:28 AM      Passed - Valid encounter within last 12 months    Recent Outpatient Visits           3 months ago Type 2 diabetes mellitus with diabetic neuropathy, without long-term current use of insulin ([place])   [facility] Birdie Sons, MD   7 months ago Type 2 diabetes mellitus with diabetic neuropathy, without long-term current use of insulin [facility])   [facility] Birdie Sons, MD   8 months ago Cough   [facility] Birdie Sons, MD   10 months ago Malignant melanoma of right forearm [facility])   [facility] Birdie Sons, MD   11 months ago Iron deficiency anemia, unspecified iron deficiency anemia type   [facility] Birdie Sons, MD       Future Appointments  In 2 weeks Ralene Bathe, MD [place]   In 2 weeks Caryn Section, Kirstie Peri, MD [facility], [facility]              lisinopril-hydrochlorothiazide (ZESTORETIC) 20-25 MG tablet [Pharmacy Med Name: Lisinopril-hydroCHLOROthiazide 20-25 MG Oral Tablet] 100 tablet 2    Sig: TAKE 1 TABLET BY MOUTH DAILY     Cardiovascular:  ACEI + Diuretic Combos Failed - 05/05/2022  5:28 AM      Failed - Na in normal range and within 180 days    Sodium  Date Value Ref Range Status  01/31/2021 138 134 - 144 mmol/L Final         Failed - K in normal range and within 180 days    Potassium  Date Value Ref Range Status  01/31/2021 4.6 3.5 - 5.2 mmol/L Final         Failed - Cr in normal range and within 180 days    Creatinine, Ser  Date Value Ref Range Status  03/23/2022 1.60 (H) 0.44 - 1.00 mg/dL Final   Creatinine, POC  Date Value Ref Range Status  07/13/2016 n/a mg/dL Final         Failed - eGFR is 30 or above and within 180 days    GFR calc Af Amer  Date Value Ref Range Status  11/14/2019 45 (L) >60 mL/min Final   GFR calc non Af Amer  Date Value Ref Range Status  11/14/2019 39 (L) >60 mL/min Final   eGFR  Date Value Ref Range Status  04/02/2021 46  Final  01/31/2021 46 (L) >59 mL/min/1.73 Final         Passed - Patient is not pregnant      Passed - Last BP in normal range    BP Readings from Last 1 Encounters:  04/27/22 113/66         Passed - Valid encounter within last 6 months    Recent Outpatient Visits           3 months ago Type 2 diabetes mellitus with diabetic neuropathy, without long-term current use of insulin ([place])   [facility] Birdie Sons, MD   7 months ago Type 2 diabetes mellitus with diabetic neuropathy, without long-term current use of insulin [facility])   [facility] Birdie Sons, MD   8 months ago Cough   [facility] Birdie Sons, MD   10 months ago Malignant melanoma of right forearm [facility])   [facility] Birdie Sons, MD   11 months ago Iron deficiency anemia, unspecified iron  deficiency anemia type   [facility] Birdie Sons, MD       Future Appointments             In 2 weeks Ralene Bathe, MD [place]   In 2 weeks Fisher, Kirstie Peri, MD [facility]

## 2022-05-19 ENCOUNTER — Telehealth: Payer: Self-pay

## 2022-05-19 NOTE — Telephone Encounter (Signed)
Called [employer] patient assistance they said they were missing the strength of the medication and informed the strength and refaxed the form with the strength. They state they will process the form

## 2022-05-19 NOTE — Telephone Encounter (Signed)
Patient is needing creon samples we have been giving her samples of this medication. Also is calling [employer] patient assistance to check status of her patient assistance

## 2022-05-19 NOTE — Telephone Encounter (Signed)
Patient came in to the office wanting samples. However, I asked her for what medication and she did not recall the name of the medicine. She stated that she will return later on today or tomorrow morning with the bottle of the medication to make sure that we would give her the correct one.

## 2022-05-20 ENCOUNTER — Ambulatory Visit: Payer: Medicare Other | Admitting: Dermatology

## 2022-05-20 VITALS — BP 107/73 | HR 72

## 2022-05-20 DIAGNOSIS — L570 Actinic keratosis: Secondary | ICD-10-CM | POA: Diagnosis not present

## 2022-05-20 DIAGNOSIS — L578 Other skin changes due to chronic exposure to nonionizing radiation: Secondary | ICD-10-CM

## 2022-05-20 DIAGNOSIS — Z8589 Personal history of malignant neoplasm of other organs and systems: Secondary | ICD-10-CM | POA: Diagnosis not present

## 2022-05-20 DIAGNOSIS — L821 Other seborrheic keratosis: Secondary | ICD-10-CM | POA: Diagnosis not present

## 2022-05-20 DIAGNOSIS — L820 Inflamed seborrheic keratosis: Secondary | ICD-10-CM | POA: Diagnosis not present

## 2022-05-20 NOTE — Progress Notes (Unsigned)
Follow-Up Visit   Subjective  Erica Rivas is a 82 y.o. female who presents for the following: Follow-up. Hx of SCC  The patient has spots on her face, arms and hands to be evaluated, some may be new or changing and the patient has concerns that these could be cancer.   The following portions of the chart were reviewed this encounter and updated as appropriate:   Tobacco  Allergies  Meds  Problems  Med Hx  Surg Hx  Fam Hx     Review of Systems:  No other skin or systemic complaints except as noted in HPI or Assessment and Plan.  Objective  Well appearing patient in no apparent distress; mood and affect are within normal limits.  A focused examination was performed including face,arms.hands . Relevant physical exam findings are noted in the Assessment and Plan.  face x 17 (17) Erythematous thin papules/macules with gritty scale.   hands x 17, right elbow x 1 (18) (18) Stuck-on, waxy, tan-brown papules -- Discussed benign etiology and prognosis.    Assessment & Plan  AK (actinic keratosis) (17) face x 17 Actinic keratoses are precancerous spots that appear secondary to cumulative UV radiation exposure/sun exposure over time. They are chronic with expected duration over 1 year. A portion of actinic keratoses will progress to squamous cell carcinoma of the skin. It is not possible to reliably predict which spots will progress to skin cancer and so treatment is recommended to prevent development of skin cancer.  Recommend daily broad spectrum sunscreen SPF 30+ to sun-exposed areas, reapply every 2 hours as needed.  Recommend staying in the shade or wearing long sleeves, sun glasses (UVA+UVB protection) and wide brim hats (4-inch brim around the entire circumference of the hat). Call for new or changing lesions.   Destruction of lesion - face x 17 Complexity: simple   Destruction method: cryotherapy   Informed consent: discussed and consent obtained   Timeout:  patient  name, date of birth, surgical site, and procedure verified Lesion destroyed using liquid nitrogen: Yes   Region frozen until ice ball extended beyond lesion: Yes   Outcome: patient tolerated procedure well with no complications   Post-procedure details: wound care instructions given    Inflamed seborrheic keratosis (18) hands x 17, right elbow x 1 (18) Symptomatic, irritating, patient would like treated. Destruction of lesion - hands x 17, right elbow x 1 (18) Complexity: simple   Destruction method: cryotherapy   Informed consent: discussed and consent obtained   Timeout:  patient name, date of birth, surgical site, and procedure verified Lesion destroyed using liquid nitrogen: Yes   Region frozen until ice ball extended beyond lesion: Yes   Outcome: patient tolerated procedure well with no complications   Post-procedure details: wound care instructions given    Actinic Damage - chronic, secondary to cumulative UV radiation exposure/sun exposure over time - diffuse scaly erythematous macules with underlying dyspigmentation - Recommend daily broad spectrum sunscreen SPF 30+ to sun-exposed areas, reapply every 2 hours as needed.  - Recommend staying in the shade or wearing long sleeves, sun glasses (UVA+UVB protection) and wide brim hats (4-inch brim around the entire circumference of the hat). - Call for new or changing lesions.   Seborrheic Keratoses - Stuck-on, waxy, tan-brown papules and/or plaques  - Benign-appearing - Discussed benign etiology and prognosis. - Observe - Call for any changes  History of Squamous Cell Carcinoma of the Skin - No evidence of recurrence today - No lymphadenopathy -  Recommend regular full body skin exams - Recommend daily broad spectrum sunscreen SPF 30+ to sun-exposed areas, reapply every 2 hours as needed.  - Call if any new or changing lesions are noted between office visits  Return in about 3 months (around 08/19/2022) for AKs, ISKs .  IMarye Round, CMA, am acting as scribe for Sarina Ser, MD .  Documentation: I have reviewed the above documentation for accuracy and completeness, and I agree with the above.  Sarina Ser, MD

## 2022-05-20 NOTE — Patient Instructions (Addendum)
Cryotherapy Aftercare  Wash gently with soap and water everyday.   Apply Vaseline and Band-Aid daily until healed.     Due to recent changes in healthcare laws, you may see results of your pathology and/or laboratory studies on MyChart before the doctors have had a chance to review them. We understand that in some cases there may be results that are confusing or concerning to you. Please understand that not all results are received at the same time and often the doctors may need to interpret multiple results in order to provide you with the best plan of care or course of treatment. Therefore, we ask that you please give us 2 business days to thoroughly review all your results before contacting the office for clarification. Should we see a critical lab result, you will be contacted sooner.   If You Need Anything After Your Visit  If you have any questions or concerns for your doctor, please call our main line at [phone] and press option 4 to reach your doctor's medical assistant. If no one answers, please leave a voicemail as directed and we will return your call as soon as possible. Messages left after 4 pm will be answered the following business day.   You may also send us a message via MyChart. We typically respond to MyChart messages within 1-2 business days.  For prescription refills, please ask your pharmacy to contact our office. Our fax number is [phone].  If you have an urgent issue when the clinic is closed that cannot wait until the next business day, you can page your doctor at the number below.    Please note that while we do our best to be available for urgent issues outside of office hours, we are not available 24/7.   If you have an urgent issue and are unable to reach us, you may choose to seek medical care at your doctor's office, retail clinic, urgent care center, or emergency room.  If you have a medical emergency, please immediately call 911 or go to the  emergency department.  Pager Numbers  - Dr. Kowalski: [phone]  - Dr. Moye: [phone]  - Dr. Stewart: [phone]  In the event of inclement weather, please call our main line at [phone] for an update on the status of any delays or closures.  Dermatology Medication Tips: Please keep the boxes that topical medications come in in order to help keep track of the instructions about where and how to use these. Pharmacies typically print the medication instructions only on the boxes and not directly on the medication tubes.   If your medication is too expensive, please contact our office at [phone] option 4 or send us a message through MyChart.   We are unable to tell what your co-pay for medications will be in advance as this is different depending on your insurance coverage. However, we may be able to find a substitute medication at lower cost or fill out paperwork to get insurance to cover a needed medication.   If a prior authorization is required to get your medication covered by your insurance company, please allow us 1-2 business days to complete this process.  Drug prices often vary depending on where the prescription is filled and some pharmacies may offer cheaper prices.  The website www.goodrx.com contains coupons for medications through different pharmacies. The prices here do not account for what the cost may be with help from insurance (it may be cheaper with your insurance), but the website can   give you the price if you did not use any insurance.  - You can print the associated coupon and take it with your prescription to the pharmacy.  - You may also stop by our office during regular business hours and pick up a GoodRx coupon card.  - If you need your prescription sent electronically to a different pharmacy, notify our office through [facility] MyChart or by phone at [phone] option 4.     Si Usted Necesita Algo Despus de Su Visita  Tambin puede  enviarnos un mensaje a travs de MyChart. Por lo general respondemos a los mensajes de MyChart en el transcurso de 1 a 2 das hbiles.  Para renovar recetas, por favor pida a su farmacia que se ponga en contacto con nuestra oficina. Nuestro nmero de fax es el [phone].  Si tiene un asunto urgente cuando la clnica est cerrada y que no puede esperar hasta el siguiente da hbil, puede llamar/localizar a su doctor(a) al nmero que aparece a continuacin.   Por favor, tenga en cuenta que aunque hacemos todo lo posible para estar disponibles para asuntos urgentes fuera del horario de oficina, no estamos disponibles las 24 horas del da, los 7 das de la semana.   Si tiene un problema urgente y no puede comunicarse con nosotros, puede optar por buscar atencin mdica  en el consultorio de su doctor(a), en una clnica privada, en un centro de atencin urgente o en una sala de emergencias.  Si tiene una emergencia mdica, por favor llame inmediatamente al 911 o vaya a la sala de emergencias.  Nmeros de bper  - Dr. Kowalski: [phone]  - Dra. Moye: [phone]  - Dra. Stewart: [phone]  En caso de inclemencias del tiempo, por favor llame a nuestra lnea principal al [phone] para una actualizacin sobre el estado de cualquier retraso o cierre.  Consejos para la medicacin en dermatologa: Por favor, guarde las cajas en las que vienen los medicamentos de uso tpico para ayudarle a seguir las instrucciones sobre dnde y cmo usarlos. Las farmacias generalmente imprimen las instrucciones del medicamento slo en las cajas y no directamente en los tubos del medicamento.   Si su medicamento es muy caro, por favor, pngase en contacto con nuestra oficina llamando al [phone] y presione la opcin 4 o envenos un mensaje a travs de MyChart.   No podemos decirle cul ser su copago por los medicamentos por adelantado ya que esto es diferente dependiendo de la cobertura de su seguro.  Sin embargo, es posible que podamos encontrar un medicamento sustituto a menor costo o llenar un formulario para que el seguro cubra el medicamento que se considera necesario.   Si se requiere una autorizacin previa para que su compaa de seguros cubra su medicamento, por favor permtanos de 1 a 2 das hbiles para completar este proceso.  Los precios de los medicamentos varan con frecuencia dependiendo del lugar de dnde se surte la receta y alguna farmacias pueden ofrecer precios ms baratos.  El sitio web www.goodrx.com tiene cupones para medicamentos de diferentes farmacias. Los precios aqu no tienen en cuenta lo que podra costar con la ayuda del seguro (puede ser ms barato con su seguro), pero el sitio web puede darle el precio si no utiliz ningn seguro.  - Puede imprimir el cupn correspondiente y llevarlo con su receta a la farmacia.  - Tambin puede pasar por nuestra oficina durante el horario de atencin regular y recoger una tarjeta de cupones de GoodRx.  -   Si necesita que su receta se enve electrnicamente a una farmacia diferente, informe a nuestra oficina a travs de MyChart de [facility] o por telfono llamando al [phone] y presione la opcin 4.  

## 2022-05-21 ENCOUNTER — Encounter: Payer: Self-pay | Admitting: Dermatology

## 2022-05-25 ENCOUNTER — Ambulatory Visit (INDEPENDENT_AMBULATORY_CARE_PROVIDER_SITE_OTHER): Payer: Medicare Other | Admitting: Family Medicine

## 2022-05-25 ENCOUNTER — Encounter: Payer: Self-pay | Admitting: Family Medicine

## 2022-05-25 VITALS — BP 125/60 | HR 67 | Wt 151.7 lb

## 2022-05-25 DIAGNOSIS — E559 Vitamin D deficiency, unspecified: Secondary | ICD-10-CM

## 2022-05-25 DIAGNOSIS — E1140 Type 2 diabetes mellitus with diabetic neuropathy, unspecified: Secondary | ICD-10-CM | POA: Diagnosis not present

## 2022-05-25 DIAGNOSIS — J449 Chronic obstructive pulmonary disease, unspecified: Secondary | ICD-10-CM | POA: Diagnosis not present

## 2022-05-25 DIAGNOSIS — F331 Major depressive disorder, recurrent, moderate: Secondary | ICD-10-CM

## 2022-05-25 DIAGNOSIS — I10 Essential (primary) hypertension: Secondary | ICD-10-CM | POA: Diagnosis not present

## 2022-05-25 DIAGNOSIS — E7800 Pure hypercholesterolemia, unspecified: Secondary | ICD-10-CM | POA: Diagnosis not present

## 2022-05-25 DIAGNOSIS — D519 Vitamin B12 deficiency anemia, unspecified: Secondary | ICD-10-CM

## 2022-05-25 LAB — POCT GLYCOSYLATED HEMOGLOBIN (HGB A1C)
Est. average glucose Bld gHb Est-mCnc: 157
Hemoglobin A1C: 7.1 % — AB (ref 4.0–5.6)

## 2022-05-25 MED ORDER — METFORMIN HCL ER 500 MG PO TB24: 500.0000 mg | ORAL_TABLET | Freq: Every day | ORAL | 3 refills | Status: DC

## 2022-05-25 MED ORDER — BUPROPION HCL ER (XL) 300 MG PO TB24: 300.0000 mg | ORAL_TABLET | Freq: Every day | ORAL | 3 refills | Status: DC

## 2022-05-25 NOTE — Progress Notes (Addendum)
I,Erica Rivas,acting as a Neurosurgeon for Erica Merry, MD.,have documented all relevant documentation on the behalf of Erica Merry, MD,as directed by  Erica Merry, MD while in the presence of Erica Merry, MD.   Established patient visit   Patient: Erica Rivas   DOB: 1940/08/15   82 y.o. Female  MRN: [id number] Visit Date: 05/25/2022  Today's healthcare provider: [name], MD   Chief Complaint  Patient presents with   Depression   Diabetes   Subjective    HPI   Diabetes Mellitus Type II, Follow-up  Lab Results  Component Value Date   HGBA1C 6.8 (A) 02/03/2022   HGBA1C 7.4 (A) 10/07/2021   HGBA1C 7.7 (H) 06/04/2021   Wt Readings from Last 3 Encounters:  04/27/22 154 lb 2 oz (69.9 kg)  03/18/22 153 lb 9.6 oz (69.7 kg)  02/18/22 158 lb 5 oz (71.8 kg)   Last seen for diabetes 4 months ago.  Management since then includes continue current medications. She reports excellent compliance with treatment. She is not having side effects.  Symptoms: Yes fatigue No foot ulcerations  No appetite changes No nausea  Yes paresthesia of the feet  No polydipsia  No polyuria No visual disturbances   No vomiting     Home blood sugar records:  100-120  Episodes of hypoglycemia? Yes rarely has sweats associated with low sugar.    Current insulin regiment: none Most Recent Eye Exam: last month   Pertinent Labs: Lab Results  Component Value Date   CHOL 140 08/08/2020   HDL 56 08/08/2020   LDLCALC 67 08/08/2020   TRIG 90 08/08/2020   CHOLHDL 2.5 08/08/2020   Lab Results  Component Value Date   NA 138 01/31/2021   K 4.6 01/31/2021   CREATININE 1.60 (H) 03/23/2022   EGFR 46 04/02/2021   MICROALBUR 20 07/13/2016   LABMICR 12.8 10/07/2021     ---------------------------------------------------------------------------------------------------  Depression, Follow-up  She  was last seen for this 4 months ago. Changes made at last visit include try bupropion  (WELLBUTRIN XL) 150 MG 24 hr tablet. Consider increasing dose of trial of SNRI if not feeling better at follow up.     She reports excellent compliance with treatment. She is not having side effects.   She reports good tolerance of treatment. Current symptoms include: fatigue She feels she is Unchanged since last visit.     02/03/2022   11:05 AM 01/08/2022   12:12 PM 10/07/2021   10:54 AM  Depression screen PHQ 2/9  Decreased Interest 3 0 1  Down, Depressed, Hopeless 2 0 2  PHQ - 2 Score 5 0 3  Altered sleeping 3  2  Tired, decreased energy 3  1  Change in appetite 0  0  Feeling bad or failure about yourself  1  0  Trouble concentrating 0  1  Moving slowly or fidgety/restless 0  0  Suicidal thoughts 0  0  PHQ-9 Score 12  7  Difficult doing work/chores Somewhat difficult  Not difficult at all    -----------------------------------------------------------------------------------------   Medications: Outpatient Medications Prior to Visit  Medication Sig   albuterol (PROVENTIL) (2.5 MG/3ML) 0.083% nebulizer solution Take 3 mLs (2.5 mg total) by nebulization 4 (four) times daily as needed for wheezing or shortness of breath (as needed up to 4 times per day.).   albuterol (VENTOLIN HFA) 108 (90 Base) MCG/ACT inhaler INHALE 2 PUFFS BY MOUTH EVERY 6 HOURS AS NEEDED FOR WHEEZING AND FOR SHORTNESS  OF BREATH   amLODipine (NORVASC) 5 MG tablet TAKE 1 TABLET BY MOUTH  DAILY   aspirin EC 81 MG tablet Take 81 mg by mouth daily.   atenolol (TENORMIN) 50 MG tablet TAKE 1 TABLET BY MOUTH  TWICE DAILY   atorvastatin (LIPITOR) 80 MG tablet TAKE 1 TABLET BY MOUTH  DAILY   Budeson-Glycopyrrol-Formoterol (BREZTRI AEROSPHERE) 160-9-4.8 MCG/ACT AERO Inhale 2 puffs into the lungs in the morning and at bedtime.   Budeson-Glycopyrrol-Formoterol (BREZTRI AEROSPHERE) 160-9-4.8 MCG/ACT AERO Inhale 2 puffs into the lungs in the morning and at bedtime.   buPROPion (WELLBUTRIN XL) 150 MG 24 hr tablet Take 1  tablet by mouth once daily   Cholecalciferol (VITAMIN D) 2000 UNITS tablet Take 1,000 Units by mouth daily.    citalopram (CELEXA) 20 MG tablet TAKE 1 TABLET BY MOUTH ONCE DAILY IN THE EVENING   cyanocobalamin (VITAMIN B12) 1000 MCG tablet Take 1 tablet (1,000 mcg total) by mouth daily.   furosemide (LASIX) 20 MG tablet Take 1 tablet (20 mg total) by mouth daily as needed (swelling).   glipiZIDE (GLUCOTROL XL) 10 MG 24 hr tablet Take 1 tablet (10 mg total) by mouth daily.   glucose blood (ONETOUCH ULTRA) test strip USE 1 STRIP TO CHECK GLUCOSE ONCE DAILY   hydrOXYzine (ATARAX) 25 MG tablet Take 12.5 mg by mouth at bedtime.   Iron-FA-B Cmp-C-Biot-Probiotic (FUSION PLUS) CAPS Take 1 capsule by mouth daily.   lipase/protease/amylase (CREON) 36000 UNITS CPEP capsule Take 2 capsules with the first bite of each meal and 1 capsule with the first bite of each snack   lisinopril-hydrochlorothiazide (ZESTORETIC) 20-25 MG tablet TAKE 1 TABLET BY MOUTH  DAILY   LORazepam (ATIVAN) 1 MG tablet Take 1 mg by mouth daily as needed.   metFORMIN (GLUCOPHAGE) 1000 MG tablet TAKE 1 TABLET BY MOUTH  TWICE DAILY   montelukast (SINGULAIR) 10 MG tablet TAKE 1 TABLET BY MOUTH  DAILY   omeprazole (PRILOSEC) 40 MG capsule Take 40 mg by mouth daily.   OneTouch Delica Lancets 33G MISC USE TO CHECK BLOOD SUGAR DAILY   OXYGEN Inhale 2 L into the lungs at bedtime. Use at night   valACYclovir (VALTREX) 1000 MG tablet 2 tablets twice a day for 1 day as needed for cold sores   No facility-administered medications prior to visit.    Review of Systems  Constitutional:  Negative for appetite change, chills, fatigue and fever.  Respiratory:  Negative for chest tightness and shortness of breath.   Cardiovascular:  Negative for chest pain and palpitations.  Gastrointestinal:  Negative for abdominal pain, nausea and vomiting.  Neurological:  Negative for dizziness and weakness.       Objective    BP 125/60 (BP Location: Right  Arm, Patient Position: Sitting, Cuff Size: Normal)   Pulse 67   Wt 151 lb 11.2 oz (68.8 kg)   SpO2 97%   BMI 23.76 kg/m    Physical Exam   General: Appearance:    Well developed, well nourished female in no acute distress  Eyes:    PERRL, conjunctiva/corneas clear, EOM's intact       Lungs:     Clear to auscultation bilaterally, respirations unlabored  Heart:    Normal heart rate. Normal rhythm. No murmurs, rubs, or gallops.    MS:   All extremities are intact.    Neurologic:   Awake, alert, oriented x 3. No apparent focal neurological defect.        Results for orders  placed or performed in visit on 05/25/22  POCT glycosylated hemoglobin (Hb A1C)  Result Value Ref Range   Hemoglobin A1C 7.1 (A) 4.0 - 5.6 %   Est. average glucose Bld gHb Est-mCnc 157     Assessment & Plan     1. Type 2 diabetes mellitus with diabetic neuropathy, without long-term current use of insulin (HCC) She does report trouble with frequent loose BM sometimes having trouble getting to bathroom. Consider GI sx and increase creatinine when last checked will change metFORMIN (GLUCOPHAGE-XR) to 500 MG 24 hr tablet; Take 1 tablet (500 mg total) by mouth daily with breakfast.  Dispense: 180 tablet; Refill: 3 - Urine microalbumin-creatinine with uACR  2. Primary hypertension Well controlled.  Continue current medications.    3. Chronic obstructive pulmonary disease, unspecified COPD type (HCC) Doing well on current inhalers.   4. Anemia due to vitamin B12 deficiency, unspecified B12 deficiency type  - CBC - Vitamin B12  5. Vitamin D deficiency  - VITAMIN D 25 Hydroxy (Vit-D Deficiency, Fractures)  6. Pure hypercholesterolemia She is tolerating atorvastatin well with no adverse effects.   - CBC - Comprehensive metabolic panel - Lipid panel  7. Moderate episode of recurrent major depressive disorder (HCC) Has stopped citalopram which wasn't helping. Is doing well with low dose bupropion and also  requests something to help quit smoking.   Will increase buPROPion (WELLBUTRIN XL) to 300 MG 24 hr tablet; Take 1 tablet (300 mg total) by mouth daily.  Dispense: 90 tablet; Refill: 3   8. Weakness She also reports progressive generalized weakness and trouble with balance. Has had a least one fall resulting in injury. She has difficulty ambulating in her home to get to the restroom, kitchen and dressing herself often having to use walls to keep herself from falling. She is need of walker with wheels and seat attachment such as a Rollator.   She states she had flu, covid, and RSV vaccines at [facility] Future Appointments  Date Time Provider Department Center  06/22/2022  1:45 PM Toney Reil, MD [facility] None  08/24/2022  3:00 PM Malva Limes, MD [facility]  08/31/2022 12:20 PM Sherrie George, MD [facility] None  09/22/2022 10:30 AM [facility] VASC 2 [facility] None  09/22/2022 11:30 AM Dew, Marlow Baars, MD [facility] None  09/24/2022  2:30 PM Deirdre Evener, MD [facility] None         The entirety of the information documented in the History of Present Illness, Review of Systems and Physical Exam were personally obtained by me. Portions of this information were initially documented by the CMA and reviewed by me for thoroughness and accuracy.     Erica Merry, MD  [facility] [phone] (phone) [phone] (fax)  [facility]

## 2022-05-26 DIAGNOSIS — J449 Chronic obstructive pulmonary disease, unspecified: Secondary | ICD-10-CM | POA: Diagnosis not present

## 2022-06-01 DIAGNOSIS — E7800 Pure hypercholesterolemia, unspecified: Secondary | ICD-10-CM | POA: Diagnosis not present

## 2022-06-01 DIAGNOSIS — E1140 Type 2 diabetes mellitus with diabetic neuropathy, unspecified: Secondary | ICD-10-CM | POA: Diagnosis not present

## 2022-06-01 DIAGNOSIS — E559 Vitamin D deficiency, unspecified: Secondary | ICD-10-CM | POA: Diagnosis not present

## 2022-06-01 DIAGNOSIS — D519 Vitamin B12 deficiency anemia, unspecified: Secondary | ICD-10-CM | POA: Diagnosis not present

## 2022-06-02 LAB — COMPREHENSIVE METABOLIC PANEL
ALT: 20 IU/L (ref 0–32)
AST: 18 IU/L (ref 0–40)
Albumin/Globulin Ratio: 1.8 (ref 1.2–2.2)
Albumin: 4.2 g/dL (ref 3.8–4.8)
Alkaline Phosphatase: 72 IU/L (ref 44–121)
BUN/Creatinine Ratio: 14 (ref 12–28)
BUN: 22 mg/dL (ref 8–27)
Bilirubin Total: 0.4 mg/dL (ref 0.0–1.2)
CO2: 20 mmol/L (ref 20–29)
Calcium: 10.4 mg/dL — ABNORMAL HIGH (ref 8.7–10.3)
Chloride: 101 mmol/L (ref 96–106)
Creatinine, Ser: 1.52 mg/dL — ABNORMAL HIGH (ref 0.57–1.00)
Globulin, Total: 2.4 g/dL (ref 1.5–4.5)
Glucose: 123 mg/dL — ABNORMAL HIGH (ref 70–99)
Potassium: 4.8 mmol/L (ref 3.5–5.2)
Sodium: 141 mmol/L (ref 134–144)
Total Protein: 6.6 g/dL (ref 6.0–8.5)
eGFR: 35 mL/min/{1.73_m2} — ABNORMAL LOW (ref >59)

## 2022-06-02 LAB — MICROALBUMIN / CREATININE URINE RATIO
Creatinine, Urine: 134.4 mg/dL
Microalb/Creat Ratio: 24 mg/g creat (ref 0–29)
Microalbumin, Urine: 32.8 ug/mL

## 2022-06-02 LAB — LIPID PANEL
Chol/HDL Ratio: 2.1 ratio (ref 0.0–4.4)
Cholesterol, Total: 145 mg/dL (ref 100–199)
HDL: 70 mg/dL (ref >39)
LDL Chol Calc (NIH): 59 mg/dL (ref 0–99)
Triglycerides: 86 mg/dL (ref 0–149)
VLDL Cholesterol Cal: 16 mg/dL (ref 5–40)

## 2022-06-02 LAB — CBC
Hematocrit: 29.4 % — ABNORMAL LOW (ref 34.0–46.6)
Hemoglobin: 9.6 g/dL — ABNORMAL LOW (ref 11.1–15.9)
MCH: 28.7 pg (ref 26.6–33.0)
MCHC: 32.7 g/dL (ref 31.5–35.7)
MCV: 88 fL (ref 79–97)
Platelets: 251 10*3/uL (ref 150–450)
RBC: 3.34 x10E6/uL — ABNORMAL LOW (ref 3.77–5.28)
RDW: 13.9 % (ref 11.7–15.4)
WBC: 10 10*3/uL (ref 3.4–10.8)

## 2022-06-02 LAB — VITAMIN B12: Vitamin B-12: >2000 pg/mL — ABNORMAL HIGH (ref 232–1245)

## 2022-06-02 LAB — VITAMIN D 25 HYDROXY (VIT D DEFICIENCY, FRACTURES): Vit D, 25-Hydroxy: 35 ng/mL (ref 30.0–100.0)

## 2022-06-08 DIAGNOSIS — R531 Weakness: Secondary | ICD-10-CM | POA: Diagnosis not present

## 2022-06-08 DIAGNOSIS — R296 Repeated falls: Secondary | ICD-10-CM | POA: Diagnosis not present

## 2022-06-12 ENCOUNTER — Encounter: Payer: Self-pay | Admitting: Pulmonary Disease

## 2022-06-22 ENCOUNTER — Encounter: Payer: Self-pay | Admitting: Gastroenterology

## 2022-06-22 ENCOUNTER — Other Ambulatory Visit: Payer: Self-pay

## 2022-06-22 ENCOUNTER — Ambulatory Visit: Payer: Medicare Other | Admitting: Gastroenterology

## 2022-06-22 VITALS — BP 118/61 | HR 64 | Temp 97.9°F | Ht 67.0 in | Wt 149.4 lb

## 2022-06-22 DIAGNOSIS — D509 Iron deficiency anemia, unspecified: Secondary | ICD-10-CM | POA: Diagnosis not present

## 2022-06-22 DIAGNOSIS — K8689 Other specified diseases of pancreas: Secondary | ICD-10-CM | POA: Diagnosis not present

## 2022-06-22 NOTE — Progress Notes (Signed)
Erica Darby, MD [address]  [place]  [place], [place] [address]  Main: [phone]  Fax: [phone]    Gastroenterology Consultation  Referring Provider:     Birdie Sons, MD Primary Care Physician:  Birdie Sons, MD Primary Gastroenterologist:  Dr. Cephas Rivas Reason for Consultation: Chronic iron deficiency anemia, exocrine pancreatic insufficiency        HPI:   Erica Rivas is a 82 y.o. female referred by Dr. Birdie Sons, MD  for consultation & management of chronic diarrhea and abdominal bloating.  Patient reports more than 1 year history of progressively worsening nonbloody diarrhea, postprandial, 5-6 times daily associated with significant abdominal bloating.  She takes Imodium as needed which makes her bloating worse and feels constipated.  She missed going to church on Sundays due to her GI symptoms.  She denies any nocturnal diarrhea, did have episodes of fecal incontinence.  She denies any rectal bleeding, weight loss or loss of appetite.  Patient also has history of chronic iron deficiency anemia, underwent EGD and colonoscopy in 2019 which were unremarkable.  At that time, I recommended we do capsule endoscopy and she deferred.  She did not follow-up with me again.  Her recent labs from January 2023 revealed persistent severe iron deficiency anemia, she also has mild B12 deficiency.  Hemoglobin 9.5, MCV 82, serum ferritin 14. Patient does state that she eats fatty foods on a regular basis, drinks [facility], coffee with creamer and sugar daily.  Patient is accompanied by her husband.  She loves eating red meat as well  She has history of chronic tobacco use, 120 pack years of smoking history, smokes half pack per day currently  Follow-up visit 06/28/2021 Patient is here for follow-up of chronic nonbloody diarrhea with abdominal bloating and weight loss, chronic iron deficiency anemia.  Workup thus far included GI profile PCR which was positive  for E. coli and norovirus.  E. coli was treated with azithromycin.  Her pancreatic fecal elastase levels were also low.  She did have severe iron and B12 deficiency.  Antiparietal cell antibodies, intrinsic factor antibodies were negative.  Was positive for E. coli and norovirus.  E. coli was treated with azithromycin.  Her pancreatic fecal elastase levels were also low.  She did have severe iron and B12 deficiency.  Antiparietal cell antibodies, intrinsic factor antibodies were negative.  Subsequently, underwent upper endoscopy and colonoscopy which were unremarkable.  Due to ongoing diarrhea despite treating her infection, repeated stool studies which were negative for infection.  Her pancreatic fecal elastase levels were still low.  Recommended Creon, patient could not afford, filed for patient assistance program which is pending.  She is taking iron supplements as well as B12 supplements.  Her diarrhea is fairly controlled on Imodium 1 to 2 pills daily.  Patient is accompanied by her husband today.  Her weight has been stable.  Follow-up visit 06/22/2022 Patient is here for follow-up of EPI and iron deficiency anemia.  She is currently taking Creon 72 K with each meal and 36 K with snack.  She reports that her symptoms of diarrhea and abdominal bloating have significantly improved.  She stopped taking iron supplements about a month ago.  She denies any melena, rectal bleeding.  She denies any chronic fatigue, shortness of breath.  She is smoking up to 4 cigarettes/day  NSAIDs: None  Antiplts/Anticoagulants/Anti thrombotics: None  GI Procedures:  EGD and colonoscopy 02/18/2022  - Normal duodenal bulb  and second portion of the duodenum. Biopsied. - Normal stomach. Biopsied. - Normal gastroesophageal junction and esophagus.  - The examined portion of the ileum was normal. - Normal mucosa in the left colon and in the right colon. Biopsied. - The distal rectum and anal verge are normal on retroflexion  view.  DIAGNOSIS: A. DUODENUM; COLD BIOPSY: - ENTERIC MUCOSA WITH PRESERVED VILLOUS ARCHITECTURE AND NO SIGNIFICANT HISTOPATHOLOGIC CHANGE. - NEGATIVE FOR FEATURES OF CELIAC, DYSPLASIA, AND MALIGNANCY.  B. STOMACH, RANDOM; COLD BIOPSY: - GASTRIC ANTRAL AND OXYNTIC MUCOSA WITH FEATURES OF REACTIVE GASTROPATHY. - MINUTE FOCUS OF INTESTINAL METAPLASIA IS PRESENT. - NEGATIVE FOR H. PYLORI, DYSPLASIA, AND MALIGNANCY.  C. COLON, RANDOM RIGHT; COLD BIOPSY: - BENIGN COLONIC MUCOSA WITH NO SIGNIFICANT HISTOPATHOLOGIC CHANGE. - NEGATIVE FOR ACTIVE MUCOSAL COLITIS AND FEATURES OF MICROSCOPIC COLITIS. - NEGATIVE FOR DYSPLASIA AND MALIGNANCY.  D. COLON, RANDOM LEFT; COLD BIOPSY: - BENIGN COLONIC MUCOSA WITH NO SIGNIFICANT HISTOPATHOLOGIC CHANGE. - NEGATIVE FOR ACTIVE MUCOSAL COLITIS AND FEATURES OF MICROSCOPIC COLITIS. - NEGATIVE FOR DYSPLASIA AND MALIGNANCY.    EGD and colonoscopy 01/18/2018 - Normal duodenal bulb and second portion of the duodenum. Biopsied. - Normal stomach. Biopsied. - 1 cm hiatal hernia. - Normal gastroesophageal junction and esophagus.  - The examined portion of the ileum was normal. - The entire examined colon is normal. - Non-bleeding external hemorrhoids. - No specimens collected.  DIAGNOSIS:  A. DUODENUM; COLD BIOPSY:  - MILD CHRONIC DUODENITIS, NON-SPECIFIC.  - NEGATIVE FOR FEATURES OF CELIAC SPRUE, DYSPLASIA, AND MALIGNANCY.   B.  STOMACH; COLD BIOPSY:  - ANTRAL MUCOSA WITH MILD REACTIVE GASTRITIS AND FOCAL INTESTINAL  METAPLASIA.  - OXYNTIC MUCOSA WITH MILD CHRONIC NON-SPECIFIC GASTRITIS.  - NEGATIVE FOR H. PYLORI, DYSPLASIA, AND MALIGNANCY.   Past Medical History:  Diagnosis Date   Actinic keratosis    Anemia    Colon polyps    Depressive disorder 07/08/2007   Diabetes mellitus without complication (HCC)    GERD (gastroesophageal reflux disease)    Hemorrhoids    History of chicken pox    History of measles    History of mumps     Hypertension    Lung nodule 02/18/2015   No additional follow up needed per CT report 03/05/2015    Melanoma ([place]) 08/14/2020   right forearm, excised 09/17/2020 [facility]   SCC (squamous cell carcinoma), hand    excision SCC left forearm and left hand 09/05/21 Dr. 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   L pretibial - ED&C    Squamous cell carcinoma of skin 05/23/2019   L calf - ED&C     Past Surgical History:  Procedure Laterality Date   ABDOMINAL HYSTERECTOMY  1985   DUB; ovaries resected/ removed   BREAST BIOPSY Right 11/12/2014   fibroadenomatous   CARDIOVASCULAR STRESS TEST  02/2008   low risk   CAROTID PTA/STENT INTERVENTION Right 11/13/2019   Procedure: CAROTID PTA/STENT INTERVENTION;  Surgeon: Algernon Huxley, MD;  Location: [facility];  Service: Cardiovascular;  Laterality: Right;   COLONOSCOPY WITH PROPOFOL N/A 01/18/2018   Procedure: COLONOSCOPY WITH PROPOFOL;  Surgeon: Lin Landsman, MD;  Location: [facility] ENDOSCOPY;  Service: Gastroenterology;  Laterality: N/A;   COLONOSCOPY WITH PROPOFOL N/A 02/18/2022   Procedure: COLONOSCOPY WITH PROPOFOL;  Surgeon: Lin Landsman, MD;  Location: [facility] ENDOSCOPY;  Service: Gastroenterology;  Laterality: N/A;   DOPPLER ECHOCARDIOGRAPHY  01/2010   EF>55%, LV relaxation impairment consistent with diastolic dysfunction, mild TR and  MR   ESOPHAGOGASTRODUODENOSCOPY  07/2009   +diffuse gastritis. Iftikhar   ESOPHAGOGASTRODUODENOSCOPY (EGD) WITH PROPOFOL N/A 01/18/2018   Procedure: ESOPHAGOGASTRODUODENOSCOPY (EGD) WITH PROPOFOL;  Surgeon: Lin Landsman, MD;  Location: [facility] ENDOSCOPY;  Service: Gastroenterology;  Laterality: N/A;   ESOPHAGOGASTRODUODENOSCOPY (EGD) WITH PROPOFOL N/A 02/18/2022   Procedure: ESOPHAGOGASTRODUODENOSCOPY (EGD) WITH PROPOFOL;  Surgeon: Lin Landsman, MD;  Location: [facility] ENDOSCOPY;  Service: Gastroenterology;  Laterality: N/A;   [place]   HAND SURGERY Right  2010   surgery x 2. Dr. Sabra Heck   MELANOMA EXCISION Right 09/17/2020   Wide excision r forearm Dr. Arrie Aran, [facility]   NECK SURGERY  1998   Disc surgey on neck   Sinus Surgery     TUBAL LIGATION  1971     Current Outpatient Medications:    albuterol (PROVENTIL) (2.5 MG/3ML) 0.083% nebulizer solution, Take 3 mLs (2.5 mg total) by nebulization 4 (four) times daily as needed for wheezing or shortness of breath (as needed up to 4 times per day.)., Disp: 120 mL, Rfl: 3   albuterol (VENTOLIN HFA) 108 (90 Base) MCG/ACT inhaler, INHALE 2 PUFFS BY MOUTH EVERY 6 HOURS AS NEEDED FOR WHEEZING AND FOR SHORTNESS OF BREATH, Disp: 9 g, Rfl: 5   amLODipine (NORVASC) 5 MG tablet, TAKE 1 TABLET BY MOUTH  DAILY, Disp: 90 tablet, Rfl: 3   aspirin EC 81 MG tablet, Take 81 mg by mouth daily., Disp: , Rfl:    atenolol (TENORMIN) 50 MG tablet, TAKE 1 TABLET BY MOUTH  TWICE DAILY, Disp: 180 tablet, Rfl: 3   atorvastatin (LIPITOR) 80 MG tablet, TAKE 1 TABLET BY MOUTH  DAILY, Disp: 90 tablet, Rfl: 3   Budeson-Glycopyrrol-Formoterol (BREZTRI AEROSPHERE) 160-9-4.8 MCG/ACT AERO, Inhale 2 puffs into the lungs in the morning and at bedtime., Disp: 10.7 g, Rfl: 0   buPROPion (WELLBUTRIN XL) 300 MG 24 hr tablet, Take 1 tablet (300 mg total) by mouth daily., Disp: 90 tablet, Rfl: 3   Cholecalciferol (VITAMIN D) 2000 UNITS tablet, Take 1,000 Units by mouth daily. , Disp: , Rfl:    cyanocobalamin (VITAMIN B12) 1000 MCG tablet, Take 1 tablet (1,000 mcg total) by mouth daily., Disp: 30 tablet, Rfl: 5   furosemide (LASIX) 20 MG tablet, Take 1 tablet (20 mg total) by mouth daily as needed (swelling)., Disp: 20 tablet, Rfl: 4   glipiZIDE (GLUCOTROL XL) 10 MG 24 hr tablet, Take 1 tablet (10 mg total) by mouth daily., Disp: 90 tablet, Rfl: 0   glucose blood (ONETOUCH ULTRA) test strip, USE 1 STRIP TO CHECK GLUCOSE ONCE DAILY, Disp: 50 each, Rfl: 4   hydrOXYzine (ATARAX) 25 MG tablet, Take 12.5 mg by mouth at bedtime.,  Disp: , Rfl:    Iron-FA-B Cmp-C-Biot-Probiotic (FUSION PLUS) CAPS, Take 1 capsule by mouth daily., Disp: 30 capsule, Rfl: 5   lipase/protease/amylase (CREON) 36000 UNITS CPEP capsule, Take 2 capsules with the first bite of each meal and 1 capsule with the first bite of each snack, Disp: 240 capsule, Rfl: 5   lisinopril-hydrochlorothiazide (ZESTORETIC) 20-25 MG tablet, TAKE 1 TABLET BY MOUTH  DAILY, Disp: 90 tablet, Rfl: 3   metFORMIN (GLUCOPHAGE-XR) 500 MG 24 hr tablet, Take 1 tablet (500 mg total) by mouth daily with breakfast., Disp: 180 tablet, Rfl: 3   montelukast (SINGULAIR) 10 MG tablet, TAKE 1 TABLET BY MOUTH  DAILY, Disp: 100 tablet, Rfl: 3   omeprazole (PRILOSEC) 40 MG capsule, Take 40 mg by mouth daily., Disp: , Rfl:  OneTouch Delica Lancets 26V MISC, USE TO CHECK BLOOD SUGAR DAILY, Disp: 100 each, Rfl: 3   OXYGEN, Inhale 2 L into the lungs at bedtime. Use at night, Disp: , Rfl:    valACYclovir (VALTREX) 1000 MG tablet, 2 tablets twice a day for 1 day as needed for cold sores, Disp: 12 tablet, Rfl: 2   Family History  Problem Relation Age of Onset   Hypertension Sister    COPD Sister    Thyroid disease Sister    Breast cancer Sister 63   Thyroid disease Sister    Heart attack Mother    Hypertension Mother    Thyroid disease Mother        HYPERTHYROIDISM   GI Bleed Father    Lung cancer Brother    Breast cancer Sister    Hypertension Sister    Aneurysm Sister        in the back of her neck   Heart attack Brother        CABG     Social History   Tobacco Use   Smoking status: Every Day    Packs/day: 2.00    Years: 60.00    Total pack years: 120.00    Types: Cigarettes   Smokeless tobacco: Never   Tobacco comments:    7-8 cigarettes daily-11/25/2021    10 Cigarettes daily- 03/18/2022 khj  Vaping Use   Vaping Use: Never used  Substance Use Topics   Alcohol use: No    Alcohol/week: 0.0 standard drinks of alcohol   Drug use: No    Allergies as of 06/22/2022 -  Review Complete 06/22/2022  Allergen Reaction Noted   Bupropion Other (See Comments) 04/22/2021    Review of Systems:    All systems reviewed and negative except where noted in HPI.   Physical Exam:  BP 118/61 (BP Location: Left Arm, Patient Position: Sitting, Cuff Size: Normal)   Pulse 64   Temp 97.9 F (36.6 C) (Oral)   Ht 5\' 7"  (1.702 m)   Wt 149 lb 6 oz (67.8 kg)   BMI 23.40 kg/m  No LMP recorded. Patient has had a hysterectomy.  General:   Alert,  Well-developed, well-nourished, pleasant and cooperative in NAD Head:  Normocephalic and atraumatic. Eyes:  Sclera clear, no icterus.   Conjunctiva pink. Ears:  Normal auditory acuity. Nose:  No deformity, discharge, or lesions. Mouth:  No deformity or lesions,oropharynx pink & moist. Neck:  Supple; no masses or thyromegaly. Lungs:  Respirations even and unlabored.  Clear throughout to auscultation.   No wheezes, crackles, or rhonchi. No acute distress. Heart:  Regular rate and rhythm; no murmurs, clicks, rubs, or gallops. Abdomen:  Normal bowel sounds. Soft, non-tender and grossly distended, tympanic to percussion without masses, hepatosplenomegaly or hernias noted.  No guarding or rebound tenderness.   Rectal: Not performed Msk:  Symmetrical without gross deformities. Good, equal movement & strength bilaterally. Pulses:  Normal pulses noted. Extremities:  No clubbing or edema.  No cyanosis. Neurologic:  Alert and oriented x3;  grossly normal neurologically. Skin:  Intact without significant lesions or rashes. No jaundice. Psych:  Alert and cooperative. Normal mood and affect.  Imaging Studies: Reviewed  Assessment and Plan:   Erica Rivas is a 82 y.o. female with history of chronic tobacco use, history of melanoma, chronic iron deficiency anemia of unclear etiology is seen in consultation for chronic unexplained diarrhea and abdominal bloating.  Workup thus far has revealed E. 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: Symptoms have responded to pancreatic enzyme replacement therapy CT pancreas protocol did not reveal any pancreatic lesions Recommend to continue Creon 36 K 2 capsules with first bite of each meal and 1 with snack, she is currently receiving medication via patient assistance program discussed about low-fat, low-carb, high-protein, small portions meal  Chronic unexplained iron deficiency anemia EGD and colonoscopy 2019 were unremarkable Repeat EGD and colonoscopy were unremarkable, recommend video capsule endoscopy intrinsic factor and parietal cell antibodies were negative Restart taking fusion plus every other day   Follow up in 4-6 months   Erica Darby, MD

## 2022-06-23 ENCOUNTER — Other Ambulatory Visit: Payer: Self-pay | Admitting: *Deleted

## 2022-06-23 DIAGNOSIS — E1140 Type 2 diabetes mellitus with diabetic neuropathy, unspecified: Secondary | ICD-10-CM

## 2022-06-26 DIAGNOSIS — J449 Chronic obstructive pulmonary disease, unspecified: Secondary | ICD-10-CM | POA: Diagnosis not present

## 2022-07-05 ENCOUNTER — Other Ambulatory Visit: Payer: Self-pay | Admitting: Family Medicine

## 2022-07-05 DIAGNOSIS — I10 Essential (primary) hypertension: Secondary | ICD-10-CM

## 2022-07-05 DIAGNOSIS — I6521 Occlusion and stenosis of right carotid artery: Secondary | ICD-10-CM

## 2022-07-06 ENCOUNTER — Encounter: Admission: RE | Payer: Self-pay | Source: Home / Self Care

## 2022-07-06 ENCOUNTER — Ambulatory Visit: Admission: RE | Admit: 2022-07-06 | Payer: Medicare Other | Source: Home / Self Care | Admitting: Gastroenterology

## 2022-07-06 SURGERY — IMAGING PROCEDURE, GI TRACT, INTRALUMINAL, VIA CAPSULE

## 2022-07-07 ENCOUNTER — Telehealth: Payer: Self-pay

## 2022-07-07 NOTE — Progress Notes (Signed)
Care Management & Coordination Services Pharmacy Team Pharmacy Assistant   Name: Erica Rivas  MRN: [id number] DOB: March 28, 1941  Reason for Encounter: Appointment Reminder  Contacted patient to confirm in office appointment with Junius Argyle, PharmD on 07/10/2022 at 1000. 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, Vitamin B12 Deficiency anemia, Pure Hypercholesterolemia, Moderate Episode of Recurrent Major Depressive disorder, Malignant Melanoma of R forearm,   Recent office visits:  05/25/2022 Lelon Huh, MD (PCP Office Visit) for Depression- Changed: Bupropion HCl 150 mg daily to 300 mg daily, Metformin 1000 mg twice daily to 500 mg daily, Stopped: Citalopram, and Lorazepam due to patient not taking, Lab orders placed,   02/03/2022 Lelon Huh, MD (PCP Office Visit) for Type II DM- Started: Bupropion HCl 150 mg daily, Lab orders placed, Patient to follow-up in 1 month  Recent consult visits:  06/22/2022 Lin Landsman, MD (Gastroenterology) for Anemia- No medication changes noted, No orders placed, Patient to follow-up in 4-6 months  05/20/2022 Sarina Ser, MD (Dermatology) for Follow-up- No medication changes noted, Orders placed, Patient to follow-up in 3 months  04/27/2022 Rohini Raeanne Gathers, MD (Gastroenterology)- for Pancreatic Insufficiency- No medication changes noted, Lab orders placed, Patient to follow-up in 6 months  03/18/2022 Vernard Gambles, MD (Pulmonary) for Follow-up- No medication changes noted, No  orders placed, Follow-up in 4 months   [id number] Heath Gold, MD (Surgery Oncology) for Melanoma Cancer Reassessment: No medication changes noted, No orders placed, No follow-up noted  02/17/2022 Sarina Ser, MD (Dermatology) for Neoplasm of skin- No medication changes noted, Orders placed, Patient to follow-up In 3 months  01/21/2022 Rohini Raeanne Gathers, MD (Gastroenterology) for Stool Incontinence- Started: Na Sulfate-K-Sulfate-Mg Sul 17.5-3.13-1.6, Changed: Lorazepam 0.5 mg daily changed to 1 mg daily, Stopped: Oxycodone 5 mg due to patient not taking, Lab orders placed, No follow-up noted  Hospital visits:  Medication Reconciliation was completed by comparing discharge summary, patient's EMR and Pharmacy list, and upon discussion with patient.  Admitted to the hospital on 02/28/2022 due to Colonoscopy. Discharge date was 02/18/2022. Discharged from [facility]?Medications Started at [facility] An Discharge:?? -Started None ID  Medication Changes at Hospital Discharge: -Changed None ID  Medications Discontinued at Hospital Discharge: -Stopped None ID  Medications that remain the same after Hospital Discharge:??  -All other medications will remain the same.    Medications: Outpatient Encounter Medications as of 07/07/2022  Medication Sig   albuterol (PROVENTIL) (2.5 MG/3ML) 0.083% nebulizer solution Take 3 mLs (2.5 mg total) by nebulization 4 (four) times daily as needed for wheezing or shortness of breath (as needed up to 4 times per day.).   albuterol (VENTOLIN HFA) 108 (90 Base) MCG/ACT inhaler INHALE 2 PUFFS BY MOUTH EVERY 6 HOURS AS NEEDED FOR WHEEZING AND FOR SHORTNESS OF BREATH   amLODipine (NORVASC) 5 MG tablet TAKE 1 TABLET BY MOUTH  DAILY   aspirin EC 81 MG tablet Take 81 mg by mouth daily.   atenolol (TENORMIN) 50 MG tablet TAKE 1 TABLET BY MOUTH  TWICE DAILY   atorvastatin (LIPITOR) 80 MG tablet TAKE 1 TABLET BY MOUTH  DAILY  Budeson-Glycopyrrol-Formoterol (BREZTRI AEROSPHERE) 160-9-4.8 MCG/ACT AERO Inhale 2 puffs into the lungs in the morning and at bedtime.   buPROPion (WELLBUTRIN XL) 300 MG 24 hr tablet Take 1 tablet (300 mg total) by mouth daily.   Cholecalciferol (VITAMIN D) 2000 UNITS tablet Take 1,000 Units by mouth daily.    cyanocobalamin (VITAMIN B12) 1000 MCG tablet Take 1 tablet (1,000 mcg total) by mouth daily.   furosemide (LASIX) 20 MG tablet Take 1 tablet (20 mg total) by mouth daily as needed (swelling).   glipiZIDE (GLUCOTROL XL) 10 MG 24 hr tablet Take 1 tablet (10 mg total) by mouth daily.   glucose blood (ONETOUCH ULTRA) test strip USE 1 STRIP TO CHECK GLUCOSE ONCE DAILY   hydrOXYzine (ATARAX) 25 MG tablet Take 12.5 mg by mouth at bedtime.   Iron-FA-B Cmp-C-Biot-Probiotic (FUSION PLUS) CAPS Take 1 capsule by mouth daily.   lipase/protease/amylase (CREON) 36000 UNITS CPEP capsule Take 2 capsules with the first bite of each meal and 1 capsule with the first bite of each snack   lisinopril-hydrochlorothiazide (ZESTORETIC) 20-25 MG tablet TAKE 1 TABLET BY MOUTH  DAILY   metFORMIN (GLUCOPHAGE-XR) 500 MG 24 hr tablet Take 1 tablet (500 mg total) by mouth daily with breakfast.   montelukast (SINGULAIR) 10 MG tablet TAKE 1 TABLET BY MOUTH  DAILY   omeprazole (PRILOSEC) 40 MG capsule Take 40 mg by mouth daily.   OneTouch Delica Lancets 99991111 MISC USE TO CHECK BLOOD SUGAR DAILY   OXYGEN Inhale 2 L into the lungs at bedtime. Use at night   valACYclovir (VALTREX) 1000 MG tablet 2 tablets twice a day for 1 day as needed for cold sores   No facility-administered encounter medications on file as of 07/07/2022.    Star Rating Drugs:  Atorvastatin 80 mg last filled on 06/02/2022 for a 70-Day supply with [place] Glipizide 10 mg last filled on 02/14/2022 for a 90-Day supply with [place] 20-25 last filled on 06/02/2022 for a 90-Day supply with [place] Metformin 500 mg last   filled on 05/25/2022 for a 100-DS with [place]: Annual wellness visit in last year? No  If Diabetic: Last eye exam / retinopathy screening: 06/23/2021 Last diabetic foot exam:  Lynann Bologna, CPA/CMA Clinical Pharmacist Assistant Phone: [phone]

## 2022-07-09 ENCOUNTER — Telehealth: Payer: Self-pay

## 2022-07-09 NOTE — Telephone Encounter (Signed)
Please cancel her appointment, we can try reaching out in a week or two to see if she would like to reschedule.   Junius Argyle, PharmD, Para [place], CPP  Clinical Pharmacist Practitioner  [facility] [phone]

## 2022-07-09 NOTE — Telephone Encounter (Signed)
Copied from [place] [phone]. Topic: Appointment Scheduling - Scheduling Inquiry for Clinic >> Jul 09, 2022  8:10 AM Tiffany B wrote: Patient would like to cancel her 07/10/2022 appointment with Germaine Pomfret, [place] at 10am. Patient declined to [facility] at this time, patient states she just does not have time

## 2022-07-20 ENCOUNTER — Encounter: Payer: Self-pay | Admitting: Dermatology

## 2022-07-20 ENCOUNTER — Ambulatory Visit: Payer: Medicare Other | Admitting: Dermatology

## 2022-07-20 VITALS — BP 106/63 | HR 81

## 2022-07-20 DIAGNOSIS — D492 Neoplasm of unspecified behavior of bone, soft tissue, and skin: Secondary | ICD-10-CM

## 2022-07-20 DIAGNOSIS — L578 Other skin changes due to chronic exposure to nonionizing radiation: Secondary | ICD-10-CM

## 2022-07-20 DIAGNOSIS — C44622 Squamous cell carcinoma of skin of right upper limb, including shoulder: Secondary | ICD-10-CM

## 2022-07-20 DIAGNOSIS — Z85820 Personal history of malignant melanoma of skin: Secondary | ICD-10-CM | POA: Diagnosis not present

## 2022-07-20 NOTE — Patient Instructions (Addendum)
Wound Care Instructions  Cleanse wound gently with soap and water once a day then pat dry with clean gauze. Apply a thin coat of Petrolatum (petroleum jelly, "Vaseline") over the wound (unless you have an allergy to this). We recommend that you use a new, sterile tube of Vaseline. Do not pick or remove scabs. Do not remove the yellow or white "healing tissue" from the base of the wound.  Cover the wound with fresh, clean, nonstick gauze and secure with paper tape. You may use Band-Aids in place of gauze and tape if the wound is small enough, but would recommend trimming much of the tape off as there is often too much. Sometimes Band-Aids can irritate the skin.  You should call the office for your biopsy report after 1 week if you have not already been contacted.  If you experience any problems, such as abnormal amounts of bleeding, swelling, significant bruising, significant pain, or evidence of infection, please call the office immediately.  FOR ADULT SURGERY PATIENTS: If you need something for pain relief you may take 1 extra strength Tylenol (acetaminophen) AND 2 Ibuprofen (200mg each) together every 4 hours as needed for pain. (do not take these if you are allergic to them or if you have a reason you should not take them.) Typically, you may only need pain medication for 1 to 3 days.     Due to recent changes in healthcare laws, you may see results of your pathology and/or laboratory studies on MyChart before the doctors have had a chance to review them. We understand that in some cases there may be results that are confusing or concerning to you. Please understand that not all results are received at the same time and often the doctors may need to interpret multiple results in order to provide you with the best plan of care or course of treatment. Therefore, we ask that you please give us 2 business days to thoroughly review all your results before contacting the office for clarification. Should  we see a critical lab result, you will be contacted sooner.   If You Need Anything After Your Visit  If you have any questions or concerns for your doctor, please call our main line at [phone] and press option 4 to reach your doctor's medical assistant. If no one answers, please leave a voicemail as directed and we will return your call as soon as possible. Messages left after 4 pm will be answered the following business day.   You may also send us a message via MyChart. We typically respond to MyChart messages within 1-2 business days.  For prescription refills, please ask your pharmacy to contact our office. Our fax number is [phone].  If you have an urgent issue when the clinic is closed that cannot wait until the next business day, you can page your doctor at the number below.    Please note that while we do our best to be available for urgent issues outside of office hours, we are not available 24/7.   If you have an urgent issue and are unable to reach us, you may choose to seek medical care at your doctor's office, retail clinic, urgent care center, or emergency room.  If you have a medical emergency, please immediately call 911 or go to the emergency department.  Pager Numbers  - Dr. Kowalski: [phone]  - Dr. Moye: [phone]  - Dr. Stewart: [phone]  In the event of inclement weather, please call our main line at   [phone] for an update on the status of any delays or closures.  Dermatology Medication Tips: Please keep the boxes that topical medications come in in order to help keep track of the instructions about where and how to use these. Pharmacies typically print the medication instructions only on the boxes and not directly on the medication tubes.   If your medication is too expensive, please contact our office at [phone] option 4 or send us a message through MyChart.   We are unable to tell what your co-pay for medications will be in  advance as this is different depending on your insurance coverage. However, we may be able to find a substitute medication at lower cost or fill out paperwork to get insurance to cover a needed medication.   If a prior authorization is required to get your medication covered by your insurance company, please allow us 1-2 business days to complete this process.  Drug prices often vary depending on where the prescription is filled and some pharmacies may offer cheaper prices.  The website www.goodrx.com contains coupons for medications through different pharmacies. The prices here do not account for what the cost may be with help from insurance (it may be cheaper with your insurance), but the website can give you the price if you did not use any insurance.  - You can print the associated coupon and take it with your prescription to the pharmacy.  - You may also stop by our office during regular business hours and pick up a GoodRx coupon card.  - If you need your prescription sent electronically to a different pharmacy, notify our office through [facility] MyChart or by phone at [phone] option 4.     Si Usted Necesita Algo Despus de Su Visita  Tambin puede enviarnos un mensaje a travs de MyChart. Por lo general respondemos a los mensajes de MyChart en el transcurso de 1 a 2 das hbiles.  Para renovar recetas, por favor pida a su farmacia que se ponga en contacto con nuestra oficina. Nuestro nmero de fax es el [phone].  Si tiene un asunto urgente cuando la clnica est cerrada y que no puede esperar hasta el siguiente da hbil, puede llamar/localizar a su doctor(a) al nmero que aparece a continuacin.   Por favor, tenga en cuenta que aunque hacemos todo lo posible para estar disponibles para asuntos urgentes fuera del horario de oficina, no estamos disponibles las 24 horas del da, los 7 das de la semana.   Si tiene un problema urgente y no puede comunicarse con nosotros, puede  optar por buscar atencin mdica  en el consultorio de su doctor(a), en una clnica privada, en un centro de atencin urgente o en una sala de emergencias.  Si tiene una emergencia mdica, por favor llame inmediatamente al 911 o vaya a la sala de emergencias.  Nmeros de bper  - Dr. Kowalski: [phone]  - Dra. Moye: [phone]  - Dra. Stewart: [phone]  En caso de inclemencias del tiempo, por favor llame a nuestra lnea principal al [phone] para una actualizacin sobre el estado de cualquier retraso o cierre.  Consejos para la medicacin en dermatologa: Por favor, guarde las cajas en las que vienen los medicamentos de uso tpico para ayudarle a seguir las instrucciones sobre dnde y cmo usarlos. Las farmacias generalmente imprimen las instrucciones del medicamento slo en las cajas y no directamente en los tubos del medicamento.   Si su medicamento es muy caro, por favor, pngase en contacto con   nuestra oficina llamando al [phone] y presione la opcin 4 o envenos un mensaje a travs de MyChart.   No podemos decirle cul ser su copago por los medicamentos por adelantado ya que esto es diferente dependiendo de la cobertura de su seguro. Sin embargo, es posible que podamos encontrar un medicamento sustituto a menor costo o llenar un formulario para que el seguro cubra el medicamento que se considera necesario.   Si se requiere una autorizacin previa para que su compaa de seguros cubra su medicamento, por favor permtanos de 1 a 2 das hbiles para completar este proceso.  Los precios de los medicamentos varan con frecuencia dependiendo del lugar de dnde se surte la receta y alguna farmacias pueden ofrecer precios ms baratos.  El sitio web www.goodrx.com tiene cupones para medicamentos de diferentes farmacias. Los precios aqu no tienen en cuenta lo que podra costar con la ayuda del seguro (puede ser ms barato con su seguro), pero el sitio web puede darle el  precio si no utiliz ningn seguro.  - Puede imprimir el cupn correspondiente y llevarlo con su receta a la farmacia.  - Tambin puede pasar por nuestra oficina durante el horario de atencin regular y recoger una tarjeta de cupones de GoodRx.  - Si necesita que su receta se enve electrnicamente a una farmacia diferente, informe a nuestra oficina a travs de MyChart de [place] o por telfono llamando al [phone] y presione la opcin 4.  

## 2022-07-20 NOTE — Progress Notes (Signed)
Follow-Up Visit   Subjective  Erica Rivas is a 82 y.o. female who presents for the following: Skin Problem (Lesion on right arm. Dur: few weeks. Tender at times).  Patient with history of melanoma. The patient has spots, moles and lesions to be evaluated, some may be new or changing and the patient has concerns that these could be cancer.  The following portions of the chart were reviewed this encounter and updated as appropriate:  Tobacco  Allergies  Meds  Problems  Med Hx  Surg Hx  Fam Hx     Review of Systems: No other skin or systemic complaints except as noted in HPI or Assessment and Plan.  Objective  Well appearing patient in no apparent distress; mood and affect are within normal limits.  A focused examination was performed including right arm. Relevant physical exam findings are noted in the Assessment and Plan.  Right proximal forearm near elbow 1.5 cm firm scaly nodule        Assessment & Plan  Neoplasm of skin Right proximal forearm near elbow Epidermal / dermal shaving  Lesion diameter (cm):  1.5 Informed consent: discussed and consent obtained   Timeout: patient name, date of birth, surgical site, and procedure verified   Procedure prep:  Patient was prepped and draped in usual sterile fashion Prep type:  Isopropyl alcohol Anesthesia: the lesion was anesthetized in a standard fashion   Anesthetic:  1% lidocaine w/ epinephrine 1-100,000 buffered w/ 8.4% NaHCO3 Instrument used: flexible razor blade   Hemostasis achieved with: pressure, aluminum chloride and electrodesiccation   Outcome: patient tolerated procedure well   Post-procedure details: sterile dressing applied and wound care instructions given   Dressing type: bandage and petrolatum    Destruction of lesion Complexity: extensive   Destruction method: electrodesiccation and curettage   Informed consent: discussed and consent obtained   Timeout:  patient name, date of birth, surgical  site, and procedure verified Procedure prep:  Patient was prepped and draped in usual sterile fashion Prep type:  Isopropyl alcohol Anesthesia: the lesion was anesthetized in a standard fashion   Anesthetic:  1% lidocaine w/ epinephrine 1-100,000 buffered w/ 8.4% NaHCO3 Curettage performed in three different directions: Yes   Electrodesiccation performed over the curetted area: Yes   Curettage cycles:  3 Lesion length (cm):  1.5 Lesion width (cm):  1.2 Margin per side (cm):  0.2 Final wound size (cm):  1.9 Hemostasis achieved with:  pressure and aluminum chloride Outcome: patient tolerated procedure well with no complications   Post-procedure details: sterile dressing applied and wound care instructions given   Dressing type: bandage and petrolatum    Specimen 1 - Surgical pathology Differential Diagnosis: R/O SCC Check Margins: No  Actinic Damage - chronic, secondary to cumulative UV radiation exposure/sun exposure over time - diffuse scaly erythematous macules with underlying dyspigmentation - Recommend daily broad spectrum sunscreen SPF 30+ to sun-exposed areas, reapply every 2 hours as needed.  - Recommend staying in the shade or wearing long sleeves, sun glasses (UVA+UVB protection) and wide brim hats (4-inch brim around the entire circumference of the hat). - Call for new or changing lesions.  History of Melanoma - No evidence of recurrence today - Recommend regular full body skin exams - Recommend daily broad spectrum sunscreen SPF 30+ to sun-exposed areas, reapply every 2 hours as needed.  - Call if any new or changing lesions are noted between office visits  Return for Follow Up As Scheduled, TBSE.  I, Sharee Pimple  Parcell, CMA, am acting as scribe for Sarina Ser, MD. Documentation: I have reviewed the above documentation for accuracy and completeness, and I agree with the above.  Sarina Ser, MD

## 2022-07-21 ENCOUNTER — Telehealth: Payer: Self-pay

## 2022-07-21 NOTE — Telephone Encounter (Signed)
Discussed pathology results. Patient voiced understanding.

## 2022-07-21 NOTE — Telephone Encounter (Signed)
-----   Message from Ralene Bathe, MD sent at 07/21/2022  5:02 PM EST ----- Diagnosis Skin , right proximal forearm near elbow SQUAMOUS CELL CARCINOMA, KERATOACANTHOMA TYPE, REGRESSING, DEEP MARGIN INVOLVED  Cancer - SCC Already treated Recheck next visit

## 2022-07-23 ENCOUNTER — Other Ambulatory Visit: Payer: Self-pay | Admitting: Family Medicine

## 2022-07-23 NOTE — Telephone Encounter (Signed)
Requested Prescriptions  Pending Prescriptions Disp Refills   ONETOUCH ULTRA test strip [Pharmacy Med Name: OneTouch Ultra Blue In Vitro Strip] 100 each 0    Sig: USE 1 STRIP TO [place] DAILY     Endocrinology: Diabetes - Testing Supplies Passed - 07/23/2022 10:23 AM      Passed - Valid encounter within last 12 months    Recent Outpatient Visits           1 month ago Type 2 diabetes mellitus with diabetic neuropathy, without long-term current use of insulin ([place])   [place] Birdie Sons, MD   5 months ago Type 2 diabetes mellitus with diabetic neuropathy, without long-term current use of insulin ([place])   [place] Birdie Sons, MD   9 months ago Type 2 diabetes mellitus with diabetic neuropathy, without long-term current use of insulin ([place])   [place] Birdie Sons, MD   11 months ago Cough   [facility] Birdie Sons, MD   1 year ago Malignant melanoma of right forearm [facility])   [place], Donald E, MD       Future Appointments             In 1 month Fisher, Kirstie Peri, MD [facility], [place]   In 2 months Ralene Bathe, MD [place]

## 2022-07-24 ENCOUNTER — Other Ambulatory Visit: Payer: Self-pay | Admitting: Family Medicine

## 2022-07-24 DIAGNOSIS — I6521 Occlusion and stenosis of right carotid artery: Secondary | ICD-10-CM

## 2022-07-24 DIAGNOSIS — I10 Essential (primary) hypertension: Secondary | ICD-10-CM

## 2022-07-25 ENCOUNTER — Encounter: Payer: Self-pay | Admitting: Dermatology

## 2022-07-25 DIAGNOSIS — J449 Chronic obstructive pulmonary disease, unspecified: Secondary | ICD-10-CM | POA: Diagnosis not present

## 2022-07-27 ENCOUNTER — Telehealth: Payer: Self-pay

## 2022-07-27 DIAGNOSIS — D509 Iron deficiency anemia, unspecified: Secondary | ICD-10-CM

## 2022-07-27 NOTE — Telephone Encounter (Signed)
Requested medication (s) are due for refill today: yes  Requested medication (s) are on the active medication list: no  Last refill:  01/21/22  Future visit scheduled: yes  Notes to clinic:  Unable to refill per protocol, last refill by another provider, historical provider. Routing for approval.     Requested Prescriptions  Pending Prescriptions Disp Refills   omeprazole (PRILOSEC) 40 MG capsule [Pharmacy Med Name: Omeprazole 40 MG Oral Capsule Delayed Release] 70 capsule 4    Sig: TAKE 1 CAPSULE BY MOUTH DAILY     Gastroenterology: Proton Pump Inhibitors Passed - 07/24/2022  5:30 PM      Passed - Valid encounter within last 12 months    Recent Outpatient Visits           2 months ago Type 2 diabetes mellitus with diabetic neuropathy, without long-term current use of insulin ([place])   [place], Donald E, MD   5 months ago Type 2 diabetes mellitus with diabetic neuropathy, without long-term current use of insulin ([place])   [place] Birdie Sons, MD   9 months ago Type 2 diabetes mellitus with diabetic neuropathy, without long-term current use of insulin ([place])   [place] Birdie Sons, MD   11 months ago Cough   [facility] Birdie Sons, MD   1 year ago Malignant melanoma of right forearm [facility])   [place], MD       Future Appointments             In 4 weeks Caryn Section, Kirstie Peri, MD [facility], [place]   In 1 month Ralene Bathe, MD [place]            Signed Prescriptions Disp Refills   amLODipine (NORVASC) 5 MG tablet 90 tablet 0    Sig: TAKE 1 TABLET BY MOUTH DAILY     Cardiovascular: Calcium Channel Blockers 2 Passed - 07/24/2022  5:30 PM      Passed - Last BP in normal range    BP Readings from Last 1 Encounters:  07/20/22 106/63          Passed - Last Heart Rate in normal range    Pulse Readings from Last 1 Encounters:  07/20/22 81         Passed - Valid encounter within last 6 months    Recent Outpatient Visits           2 months ago Type 2 diabetes mellitus with diabetic neuropathy, without long-term current use of insulin ([place])   [place], Donald E, MD   5 months ago Type 2 diabetes mellitus with diabetic neuropathy, without long-term current use of insulin ([place])   [place] Birdie Sons, MD   9 months ago Type 2 diabetes mellitus with diabetic neuropathy, without long-term current use of insulin ([place])   [place] Birdie Sons, MD   11 months ago Cough   [facility] Birdie Sons, MD   1 year ago Malignant melanoma of right forearm [facility])   [place], Donald E, MD       Future Appointments             In 4 weeks Fisher, Kirstie Peri, MD [facility], [place]   In 1 month Ralene Bathe, MD [place]             lisinopril-hydrochlorothiazide (ZESTORETIC) 20-25 MG tablet 90 tablet 0    Sig: TAKE 1 TABLET BY MOUTH DAILY     Cardiovascular:  ACEI + Diuretic Combos Failed - 07/24/2022  5:30 PM      Failed - Cr in normal range and within 180 days    Creatinine, Ser  Date Value Ref Range Status  06/01/2022 1.52 (H) 0.57 - 1.00 mg/dL Final   Creatinine, POC  Date Value Ref Range Status  07/13/2016 n/a mg/dL Final         Passed - Na in normal range and within 180 days    Sodium  Date Value Ref Range Status  06/01/2022 141 134 - 144 mmol/L Final         Passed - K in normal range and within 180 days    Potassium  Date Value Ref Range Status  06/01/2022 4.8 3.5 - 5.2 mmol/L Final         Passed - eGFR is 30 or above and within 180 days    GFR calc Af Amer  Date Value Ref Range Status   11/14/2019 45 (L) >60 mL/min Final   GFR calc non Af Amer  Date Value Ref Range Status  11/14/2019 39 (L) >60 mL/min Final   eGFR  Date Value Ref Range Status  06/01/2022 35 (L) >59 mL/min/1.73 Final         Passed - Patient is not pregnant      Passed - Last BP in normal range    BP Readings from Last 1 Encounters:  07/20/22 106/63         Passed - Valid encounter within last 6 months    Recent Outpatient Visits           2 months ago Type 2 diabetes mellitus with diabetic neuropathy, without long-term current use of insulin ([place])   [place], Donald E, MD   5 months ago Type 2 diabetes mellitus with diabetic neuropathy, without long-term current use of insulin ([place])   [place], Donald E, MD   9 months ago Type 2 diabetes mellitus with diabetic neuropathy, without long-term current use of insulin ([place])   [place] Birdie Sons, MD   11 months ago Cough   [facility] Birdie Sons, MD   1 year ago Malignant melanoma of right forearm [facility])   [place], Donald E, MD       Future Appointments             In 4 weeks Caryn Section, Kirstie Peri, MD [facility], [place]   In 1 month Ralene Bathe, MD [place]             atorvastatin (LIPITOR) 80 MG tablet 90 tablet 0    Sig: TAKE 1 TABLET BY MOUTH ONCE  DAILY     Cardiovascular:  Antilipid - Statins Failed - 07/24/2022  5:30 PM      Failed - Lipid Panel in normal range within the last 12 months    Cholesterol, Total  Date Value Ref Range Status  06/01/2022 145 100 - 199 mg/dL Final   LDL Chol Calc (NIH)  Date Value Ref Range Status  06/01/2022 59 0 - 99 mg/dL Final   HDL  Date Value Ref Range Status  06/01/2022 70 >39 mg/dL Final   Triglycerides  Date Value Ref Range Status  06/01/2022 86 0 - 149  mg/dL Final         Passed - Patient is not pregnant      Passed - Valid encounter within last 12 months    Recent Outpatient Visits           2 months ago Type 2 diabetes mellitus with diabetic neuropathy, without long-term current use of insulin ([place])   [place], Donald E, MD   5 months ago Type 2 diabetes mellitus with diabetic neuropathy, without long-term current use of insulin ([place])   [place], Donald E, MD   9 months ago Type 2 diabetes mellitus with diabetic neuropathy, without long-term current use of insulin ([place])   [place] Birdie Sons, MD   11 months ago Cough   [facility] Birdie Sons, MD   1 year ago Malignant melanoma of right forearm [facility])   [place], Donald E, MD       Future Appointments             In 4 weeks Caryn Section, Kirstie Peri, MD [facility], [place]   In 1 month Ralene Bathe, MD [place]             atenolol (TENORMIN) 50 MG tablet 180 tablet 0    Sig: TAKE 1 TABLET BY MOUTH TWICE  DAILY     Cardiovascular: Beta Blockers 2 Failed - 07/24/2022  5:30 PM      Failed - Cr in normal range and within 360 days    Creatinine, Ser  Date Value Ref Range Status  06/01/2022 1.52 (H) 0.57 - 1.00 mg/dL Final   Creatinine, POC  Date Value Ref Range Status  07/13/2016 n/a mg/dL Final         Passed - Last BP in normal range    BP Readings from Last 1 Encounters:  07/20/22 106/63         Passed - Last Heart Rate in normal range    Pulse Readings from Last 1 Encounters:  07/20/22 81         Passed - Valid encounter within last 6 months    Recent Outpatient Visits           2 months ago Type 2 diabetes mellitus with diabetic neuropathy, without long-term current use of insulin ([place])   [place], Donald E, MD   5 months ago Type 2 diabetes mellitus with diabetic neuropathy, without long-term current use of insulin ([place])   [place], Donald E, MD   9 months ago Type 2 diabetes mellitus with diabetic neuropathy, without long-term current use of insulin ([place])   [place], Donald E, MD   11 months ago Cough   [facility] Birdie Sons, MD   1 year ago Malignant melanoma of right forearm [facility])   [place], Donald E, MD       Future Appointments             In 4 weeks Fisher, Kirstie Peri, MD [facility],  [facility]   In 1 month Ralene Bathe, MD [place]

## 2022-07-27 NOTE — Telephone Encounter (Signed)
-----   Message from Erica Rivas, [place] sent at 04/28/2022 12:35 PM EST ----- 3 months before we recheck her CBC and iron panel

## 2022-07-27 NOTE — Telephone Encounter (Signed)
Requested Prescriptions  Pending Prescriptions Disp Refills   amLODipine (NORVASC) 5 MG tablet [Pharmacy Med Name: amLODIPine Besylate 5 MG Oral Tablet] 90 tablet 0    Sig: TAKE 1 TABLET BY MOUTH DAILY     Cardiovascular: Calcium Channel Blockers 2 Passed - 07/24/2022  5:30 PM      Passed - Last BP in normal range    BP Readings from Last 1 Encounters:  07/20/22 106/63         Passed - Last Heart Rate in normal range    Pulse Readings from Last 1 Encounters:  07/20/22 81         Passed - Valid encounter within last 6 months    Recent Outpatient Visits           2 months ago Type 2 diabetes mellitus with diabetic neuropathy, without long-term current use of insulin ([place])   [place], Donald E, MD   5 months ago Type 2 diabetes mellitus with diabetic neuropathy, without long-term current use of insulin ([place])   [place], Donald E, MD   9 months ago Type 2 diabetes mellitus with diabetic neuropathy, without long-term current use of insulin ([place])   [place] Birdie Sons, MD   11 months ago Cough   [facility] Birdie Sons, MD   1 year ago Malignant melanoma of right forearm [facility])   [place], Donald E, MD       Future Appointments             In 4 weeks Caryn Section, Kirstie Peri, MD [facility], [place]   In 1 month Ralene Bathe, MD [place]             omeprazole (PRILOSEC) 40 MG capsule [Pharmacy Med Name: Omeprazole 40 MG Oral Capsule Delayed Release] 70 capsule 4    Sig: TAKE 1 CAPSULE BY MOUTH DAILY     Gastroenterology: Proton Pump Inhibitors Passed - 07/24/2022  5:30 PM      Passed - Valid encounter within last 12 months    Recent Outpatient Visits           2 months ago Type 2 diabetes mellitus with diabetic neuropathy, without long-term current use  of insulin ([place])   [place], Donald E, MD   5 months ago Type 2 diabetes mellitus with diabetic neuropathy, without long-term current use of insulin ([place])   [place] Birdie Sons, MD   9 months ago Type 2 diabetes mellitus with diabetic neuropathy, without long-term current use of insulin ([place])   [place] Birdie Sons, MD   11 months ago Cough   [facility] Birdie Sons, MD   1 year ago Malignant melanoma of right forearm [facility])   [place], Donald E, MD       Future Appointments             In 4 weeks Caryn Section, Kirstie Peri, MD [facility], [place]   In 1 month Ralene Bathe, MD [place]             lisinopril-hydrochlorothiazide (ZESTORETIC) 20-25 MG tablet [Pharmacy Med Name: Lisinopril-hydroCHLOROthiazide 20-25 MG Oral Tablet] 90 tablet 0    Sig: TAKE  1 TABLET BY MOUTH DAILY     Cardiovascular:  ACEI + Diuretic Combos Failed - 07/24/2022  5:30 PM      Failed - Cr in normal range and within 180 days    Creatinine, Ser  Date Value Ref Range Status  06/01/2022 1.52 (H) 0.57 - 1.00 mg/dL Final   Creatinine, POC  Date Value Ref Range Status  07/13/2016 n/a mg/dL Final         Passed - Na in normal range and within 180 days    Sodium  Date Value Ref Range Status  06/01/2022 141 134 - 144 mmol/L Final         Passed - K in normal range and within 180 days    Potassium  Date Value Ref Range Status  06/01/2022 4.8 3.5 - 5.2 mmol/L Final         Passed - eGFR is 30 or above and within 180 days    GFR calc Af Amer  Date Value Ref Range Status  11/14/2019 45 (L) >60 mL/min Final   GFR calc non Af Amer  Date Value Ref Range Status  11/14/2019 39 (L) >60 mL/min Final   eGFR  Date Value Ref Range Status  06/01/2022 35 (L) >59 mL/min/1.73 Final          Passed - Patient is not pregnant      Passed - Last BP in normal range    BP Readings from Last 1 Encounters:  07/20/22 106/63         Passed - Valid encounter within last 6 months    Recent Outpatient Visits           2 months ago Type 2 diabetes mellitus with diabetic neuropathy, without long-term current use of insulin ([place])   [place], Donald E, MD   5 months ago Type 2 diabetes mellitus with diabetic neuropathy, without long-term current use of insulin ([place])   [place], Donald E, MD   9 months ago Type 2 diabetes mellitus with diabetic neuropathy, without long-term current use of insulin ([place])   [place] Birdie Sons, MD   11 months ago Cough   [facility] Birdie Sons, MD   1 year ago Malignant melanoma of right forearm [facility])   [place], Donald E, MD       Future Appointments             In 4 weeks Caryn Section, Kirstie Peri, MD [facility], [place]   In 1 month Ralene Bathe, MD [place]             atorvastatin (LIPITOR) 80 MG tablet [Pharmacy Med Name: Atorvastatin Calcium 80 MG Oral Tablet] 90 tablet 0    Sig: TAKE 1 TABLET BY MOUTH ONCE  DAILY     Cardiovascular:  Antilipid - Statins Failed - 07/24/2022  5:30 PM      Failed - Lipid Panel in normal range within the last 12 months    Cholesterol, Total  Date Value Ref Range Status  06/01/2022 145 100 - 199 mg/dL Final   LDL Chol Calc (NIH)  Date Value Ref Range Status  06/01/2022 59 0 - 99 mg/dL Final   HDL  Date Value Ref Range Status  06/01/2022 70 >39 mg/dL Final   Triglycerides  Date Value Ref Range Status  06/01/2022 86 0 - 149 mg/dL Final         Passed - Patient is not pregnant      Passed - Valid encounter within last 12 months    Recent Outpatient Visits           2 months  ago Type 2 diabetes mellitus with diabetic neuropathy, without long-term current use of insulin ([place])   [place], Donald E, MD   5 months ago Type 2 diabetes mellitus with diabetic neuropathy, without long-term current use of insulin ([place])   [place], Donald E, MD   9 months ago Type 2 diabetes mellitus with diabetic neuropathy, without long-term current use of insulin ([place])   [place] Birdie Sons, MD   11 months ago Cough   [facility] Birdie Sons, MD   1 year ago Malignant melanoma of right forearm [facility])   [place], Donald E, MD       Future Appointments             In 4 weeks Caryn Section, Kirstie Peri, MD [facility], [place]   In 1 month Ralene Bathe, MD [place]             atenolol (TENORMIN) 50 MG tablet [Pharmacy Med Name: Atenolol 50 MG Oral Tablet] 180 tablet 0    Sig: TAKE 1 TABLET BY MOUTH TWICE  DAILY     Cardiovascular: Beta Blockers 2 Failed - 07/24/2022  5:30 PM      Failed - Cr in normal range and within 360 days    Creatinine, Ser  Date Value Ref Range Status  06/01/2022 1.52 (H) 0.57 - 1.00 mg/dL Final   Creatinine, POC  Date Value Ref Range Status  07/13/2016 n/a mg/dL Final         Passed - Last BP in normal range    BP Readings from Last 1 Encounters:  07/20/22 106/63         Passed - Last Heart Rate in normal range    Pulse Readings from Last 1 Encounters:  07/20/22 81         Passed - Valid encounter within last 6 months    Recent Outpatient Visits           2 months ago Type 2 diabetes mellitus with diabetic neuropathy, without long-term current use of insulin ([place])   [place], Donald E, MD   5 months ago Type 2 diabetes mellitus with diabetic neuropathy, without long-term current use  of insulin ([place])   [place] Birdie Sons, MD   9 months ago Type 2 diabetes mellitus with diabetic neuropathy, without long-term current use of insulin ([place])   [place] Birdie Sons, MD   11 months ago Cough   [facility] Birdie Sons, MD   1 year ago Malignant melanoma of right forearm [facility])   [place], Donald E, MD       Future Appointments             In 4 weeks [place], Kirstie Peri, MD [facility], [place]   In 1 month Ralene Bathe, MD [place]

## 2022-07-27 NOTE — Telephone Encounter (Signed)
Patient verbalized understanding of instructions. She states she will go get the labs done sometime this week

## 2022-07-28 LAB — CBC
Hematocrit: 29 % — ABNORMAL LOW (ref 34.0–46.6)
Hemoglobin: 9.5 g/dL — ABNORMAL LOW (ref 11.1–15.9)
MCH: 28.6 pg (ref 26.6–33.0)
MCHC: 32.8 g/dL (ref 31.5–35.7)
MCV: 87 fL (ref 79–97)
Platelets: 276 10*3/uL (ref 150–450)
RBC: 3.32 x10E6/uL — ABNORMAL LOW (ref 3.77–5.28)
RDW: 13.9 % (ref 11.7–15.4)
WBC: 10.4 10*3/uL (ref 3.4–10.8)

## 2022-07-28 LAB — IRON,TIBC AND FERRITIN PANEL
Ferritin: 44 ng/mL (ref 15–150)
Iron Saturation: 39 % (ref 15–55)
Iron: 132 ug/dL (ref 27–139)
Total Iron Binding Capacity: 338 ug/dL (ref 250–450)
UIBC: 206 ug/dL (ref 118–369)

## 2022-07-29 ENCOUNTER — Other Ambulatory Visit: Payer: Self-pay | Admitting: Family Medicine

## 2022-07-29 DIAGNOSIS — E1140 Type 2 diabetes mellitus with diabetic neuropathy, unspecified: Secondary | ICD-10-CM

## 2022-07-29 MED ORDER — ONETOUCH ULTRA VI STRP: ORAL_STRIP | 0 refills | Status: DC

## 2022-07-30 ENCOUNTER — Telehealth: Payer: Self-pay

## 2022-07-30 NOTE — Telephone Encounter (Signed)
Patient verbalized understanding of results  

## 2022-07-30 NOTE — Telephone Encounter (Signed)
-----   Message from Lin Landsman, MD sent at 07/30/2022 10:40 AM EDT ----- Please inform patient that her hemoglobin is stable and her iron levels are slowly improving.  Recommend to continue taking fusion plus every other day at least for 3 months  RV

## 2022-08-04 MED ORDER — ONETOUCH ULTRA VI STRP: ORAL_STRIP | 3 refills | Status: DC

## 2022-08-04 NOTE — Addendum Note (Signed)
Addended by: Lelon Huh E on: 08/04/2022 11:40 AM   Modules accepted: Orders

## 2022-08-17 ENCOUNTER — Other Ambulatory Visit: Payer: Self-pay | Admitting: Family Medicine

## 2022-08-17 MED ORDER — GLIPIZIDE ER 10 MG PO TB24: 10.0000 mg | ORAL_TABLET | Freq: Every day | ORAL | 1 refills | Status: DC

## 2022-08-17 NOTE — Telephone Encounter (Signed)
Requested Prescriptions  Pending Prescriptions Disp Refills   glipiZIDE (GLUCOTROL XL) 10 MG 24 hr tablet 90 tablet 1    Sig: Take 1 tablet (10 mg total) by mouth daily.     Endocrinology:  Diabetes - Sulfonylureas Failed - 08/17/2022 11:59 AM      Failed - Cr in normal range and within 360 days    Creatinine, Ser  Date Value Ref Range Status  06/01/2022 1.52 (H) 0.57 - 1.00 mg/dL Final   Creatinine, POC  Date Value Ref Range Status  07/13/2016 n/a mg/dL Final         Passed - HBA1C is between 0 and 7.9 and within 180 days    Hemoglobin A1C  Date Value Ref Range Status  05/25/2022 7.1 (A) 4.0 - 5.6 % Final   Hgb A1c MFr Bld  Date Value Ref Range Status  06/04/2021 7.7 (H) 4.8 - 5.6 % Final    Comment:             Prediabetes: 5.7 - 6.4          Diabetes: >6.4          Glycemic control for adults with diabetes: <7.0          Passed - Valid encounter within last 6 months    Recent Outpatient Visits           2 months ago Type 2 diabetes mellitus with diabetic neuropathy, without long-term current use of insulin ([place])   [place], Donald E, MD   6 months ago Type 2 diabetes mellitus with diabetic neuropathy, without long-term current use of insulin ([place])   [place], Donald E, MD   10 months ago Type 2 diabetes mellitus with diabetic neuropathy, without long-term current use of insulin ([place])   [place], Donald E, MD   11 months ago Cough   [facility] Birdie Sons, MD   1 year ago Malignant melanoma of right forearm [facility])   [place], Donald E, MD       Future Appointments             In 1 week [place], Kirstie Peri, MD [facility], [place]   In 1 month Ralene Bathe, MD [place]

## 2022-08-17 NOTE — Telephone Encounter (Signed)
Medication Refill - Medication: glipiZIDE (GLUCOTROL XL) 10 MG 24 hr tablet  Pt has 3 pills left.   Has the patient contacted their pharmacy? No.  Preferred Pharmacy (with phone number or street name):  [facility] [place], [place] Phone: [phone]  Fax: [phone]     Has the patient been seen for an appointment in the last year OR does the patient have an upcoming appointment? Yes.    Agent: Please be advised that RX refills may take up to 3 business days. We ask that you follow-up with your pharmacy.

## 2022-08-24 ENCOUNTER — Ambulatory Visit: Payer: Medicare Other | Admitting: Family Medicine

## 2022-08-25 DIAGNOSIS — J449 Chronic obstructive pulmonary disease, unspecified: Secondary | ICD-10-CM | POA: Diagnosis not present

## 2022-08-25 NOTE — Progress Notes (Unsigned)
Vivien Rota DeSanto,acting as a scribe for [name], MD.,have documented all relevant documentation on the behalf of [name], MD,as directed by  [name], MD while in the presence of [name], MD.     Established patient visit   Patient: Erica Rivas   DOB: Dec 25, 1940   82 y.o. Female  MRN: [id number] Visit Date: 08/26/2022  Today's healthcare provider: [name], MD   No chief complaint on file.  Subjective    HPI  Diabetes Mellitus Type II, Follow-up  Lab Results  Component Value Date   HGBA1C 7.1 (A) 05/25/2022   HGBA1C 6.8 (A) 02/03/2022   HGBA1C 7.4 (A) 10/07/2021   Wt Readings from Last 3 Encounters:  06/22/22 149 lb 6 oz (67.8 kg)  05/25/22 151 lb 11.2 oz (68.8 kg)  04/27/22 154 lb 2 oz (69.9 kg)   Last seen for diabetes 3 months ago.  Management since then includes changed Metformin XR 500 to Metformin 500.Marland Kitchen She reports {excellent/good/fair/poor:19665} compliance with treatment. She {is/is not:21021397} having side effects. {document side effects if present:1} Symptoms: {Yes/No:20286} fatigue {Yes/No:20286} foot ulcerations  {Yes/No:20286} appetite changes {Yes/No:20286} nausea  {Yes/No:20286} paresthesia of the feet  {Yes/No:20286} polydipsia  {Yes/No:20286} polyuria {Yes/No:20286} visual disturbances   {Yes/No:20286} vomiting     Home blood sugar records: {diabetes glucometry results:16657}  Episodes of hypoglycemia? {Yes/No:20286} {enter symptoms and frequency of symptoms if yes:1}   Current insulin regiment: {enter 'none' or type of insulin and number of units taken with each dose of each insulin formulation that the patient is taking:1} Most Recent Eye Exam: *** {Current exercise:16438:::1} {Current diet habits:16563:::1}  Pertinent Labs: Lab Results  Component Value Date   CHOL 145 06/01/2022   HDL 70 06/01/2022   LDLCALC 59 06/01/2022   TRIG 86 06/01/2022   CHOLHDL 2.1 06/01/2022   Lab Results  Component Value Date    NA 141 06/01/2022   K 4.8 06/01/2022   CREATININE 1.52 (H) 06/01/2022   EGFR 35 (L) 06/01/2022   LABMICR 32.8 06/01/2022   MICRALBCREAT 24 06/01/2022     ---------------------------------------------------------------------------------------------------   Medications: Outpatient Medications Prior to Visit  Medication Sig   albuterol (PROVENTIL) (2.5 MG/3ML) 0.083% nebulizer solution Take 3 mLs (2.5 mg total) by nebulization 4 (four) times daily as needed for wheezing or shortness of breath (as needed up to 4 times per day.).   albuterol (VENTOLIN HFA) 108 (90 Base) MCG/ACT inhaler INHALE 2 PUFFS BY MOUTH EVERY 6 HOURS AS NEEDED FOR WHEEZING AND FOR SHORTNESS OF BREATH   amLODipine (NORVASC) 5 MG tablet TAKE 1 TABLET BY MOUTH DAILY   aspirin EC 81 MG tablet Take 81 mg by mouth daily.   atenolol (TENORMIN) 50 MG tablet TAKE 1 TABLET BY MOUTH TWICE  DAILY   atorvastatin (LIPITOR) 80 MG tablet TAKE 1 TABLET BY MOUTH ONCE  DAILY   Budeson-Glycopyrrol-Formoterol (BREZTRI AEROSPHERE) 160-9-4.8 MCG/ACT AERO Inhale 2 puffs into the lungs in the morning and at bedtime.   buPROPion (WELLBUTRIN XL) 300 MG 24 hr tablet Take 1 tablet (300 mg total) by mouth daily.   Cholecalciferol (VITAMIN D) 2000 UNITS tablet Take 1,000 Units by mouth daily.    cyanocobalamin (VITAMIN B12) 1000 MCG tablet Take 1 tablet (1,000 mcg total) by mouth daily.   furosemide (LASIX) 20 MG tablet Take 1 tablet (20 mg total) by mouth daily as needed (swelling).   glipiZIDE (GLUCOTROL XL) 10 MG 24 hr tablet Take 1 tablet (10 mg total) by mouth daily.  glucose blood (ONETOUCH ULTRA) test strip (Onetouch Ultra Blue) Use strip daily to check blood sugar for type 2 diabetes (E11.9)   hydrOXYzine (ATARAX) 25 MG tablet Take 12.5 mg by mouth at bedtime.   Iron-FA-B Cmp-C-Biot-Probiotic (FUSION PLUS) CAPS Take 1 capsule by mouth daily.   lipase/protease/amylase (CREON) 36000 UNITS CPEP capsule Take 2 capsules with the first bite of  each meal and 1 capsule with the first bite of each snack   lisinopril-hydrochlorothiazide (ZESTORETIC) 20-25 MG tablet TAKE 1 TABLET BY MOUTH DAILY   metFORMIN (GLUCOPHAGE-XR) 500 MG 24 hr tablet Take 1 tablet (500 mg total) by mouth daily with breakfast.   montelukast (SINGULAIR) 10 MG tablet TAKE 1 TABLET BY MOUTH  DAILY   omeprazole (PRILOSEC) 40 MG capsule TAKE 1 CAPSULE BY MOUTH DAILY   OneTouch Delica Lancets 33G MISC USE TO CHECK BLOOD SUGAR DAILY   OXYGEN Inhale 2 L into the lungs at bedtime. Use at night   valACYclovir (VALTREX) 1000 MG tablet 2 tablets twice a day for 1 day as needed for cold sores   No facility-administered medications prior to visit.    Review of Systems  {Labs  Heme  Chem  Endocrine  Serology  Results Review (optional):23779}   Objective    There were no vitals taken for this visit. {Show previous vital signs (optional):23777}  Physical Exam  ***  No results found for any visits on 08/26/22.  Assessment & Plan     ***  No follow-ups on file.      {provider attestation***:1}   [name], MD  [facility] [phone] (phone) [phone] (fax)  [facility]

## 2022-08-26 ENCOUNTER — Ambulatory Visit (INDEPENDENT_AMBULATORY_CARE_PROVIDER_SITE_OTHER): Payer: Medicare Other | Admitting: Family Medicine

## 2022-08-26 VITALS — BP 112/47 | HR 58 | Temp 98.1°F | Wt 146.0 lb

## 2022-08-26 DIAGNOSIS — E2839 Other primary ovarian failure: Secondary | ICD-10-CM

## 2022-08-26 DIAGNOSIS — E1140 Type 2 diabetes mellitus with diabetic neuropathy, unspecified: Secondary | ICD-10-CM

## 2022-08-26 DIAGNOSIS — I10 Essential (primary) hypertension: Secondary | ICD-10-CM | POA: Diagnosis not present

## 2022-08-26 DIAGNOSIS — N1832 Chronic kidney disease, stage 3b: Secondary | ICD-10-CM

## 2022-08-26 DIAGNOSIS — I700 Atherosclerosis of aorta: Secondary | ICD-10-CM | POA: Diagnosis not present

## 2022-08-26 LAB — POCT GLYCOSYLATED HEMOGLOBIN (HGB A1C)
Est. average glucose Bld gHb Est-mCnc: 157
Hemoglobin A1C: 7.1 % — AB (ref 4.0–5.6)

## 2022-08-26 NOTE — Patient Instructions (Signed)
.   Please review the attached list of medications and notify my office if there are any errors.   . Please bring all of your medications to every appointment so we can make sure that our medication list is the same as yours.   

## 2022-08-28 ENCOUNTER — Encounter: Payer: Self-pay | Admitting: Family Medicine

## 2022-08-31 ENCOUNTER — Encounter (INDEPENDENT_AMBULATORY_CARE_PROVIDER_SITE_OTHER): Payer: Medicare Other | Admitting: Ophthalmology

## 2022-08-31 DIAGNOSIS — H43823 Vitreomacular adhesion, bilateral: Secondary | ICD-10-CM | POA: Diagnosis not present

## 2022-08-31 DIAGNOSIS — H35373 Puckering of macula, bilateral: Secondary | ICD-10-CM | POA: Diagnosis not present

## 2022-08-31 DIAGNOSIS — I10 Essential (primary) hypertension: Secondary | ICD-10-CM

## 2022-08-31 DIAGNOSIS — H35033 Hypertensive retinopathy, bilateral: Secondary | ICD-10-CM | POA: Diagnosis not present

## 2022-08-31 DIAGNOSIS — H43813 Vitreous degeneration, bilateral: Secondary | ICD-10-CM | POA: Diagnosis not present

## 2022-08-31 LAB — HM DIABETES EYE EXAM

## 2022-09-17 ENCOUNTER — Other Ambulatory Visit: Payer: Self-pay | Admitting: Pulmonary Disease

## 2022-09-20 ENCOUNTER — Other Ambulatory Visit: Payer: Self-pay | Admitting: Family Medicine

## 2022-09-20 DIAGNOSIS — I10 Essential (primary) hypertension: Secondary | ICD-10-CM

## 2022-09-20 DIAGNOSIS — I6521 Occlusion and stenosis of right carotid artery: Secondary | ICD-10-CM

## 2022-09-21 ENCOUNTER — Other Ambulatory Visit (INDEPENDENT_AMBULATORY_CARE_PROVIDER_SITE_OTHER): Payer: Self-pay | Admitting: Vascular Surgery

## 2022-09-21 DIAGNOSIS — I6523 Occlusion and stenosis of bilateral carotid arteries: Secondary | ICD-10-CM

## 2022-09-22 ENCOUNTER — Ambulatory Visit (INDEPENDENT_AMBULATORY_CARE_PROVIDER_SITE_OTHER): Payer: Medicare Other | Admitting: Vascular Surgery

## 2022-09-22 ENCOUNTER — Encounter (INDEPENDENT_AMBULATORY_CARE_PROVIDER_SITE_OTHER): Payer: Self-pay | Admitting: Vascular Surgery

## 2022-09-22 ENCOUNTER — Ambulatory Visit (INDEPENDENT_AMBULATORY_CARE_PROVIDER_SITE_OTHER): Payer: Medicare Other

## 2022-09-22 VITALS — BP 118/67 | HR 65 | Resp 18 | Ht 66.0 in | Wt 149.0 lb

## 2022-09-22 DIAGNOSIS — I6523 Occlusion and stenosis of bilateral carotid arteries: Secondary | ICD-10-CM | POA: Diagnosis not present

## 2022-09-22 DIAGNOSIS — N1832 Chronic kidney disease, stage 3b: Secondary | ICD-10-CM

## 2022-09-22 DIAGNOSIS — I10 Essential (primary) hypertension: Secondary | ICD-10-CM | POA: Diagnosis not present

## 2022-09-22 DIAGNOSIS — E1140 Type 2 diabetes mellitus with diabetic neuropathy, unspecified: Secondary | ICD-10-CM | POA: Diagnosis not present

## 2022-09-22 NOTE — Progress Notes (Signed)
MRN : [id number]  Erica Rivas is a 82 y.o. (August 14, 1940) female who presents with chief complaint of  Chief Complaint  Patient presents with   Follow-up    f/u in 1 year with carotid  .  History of Present Illness: Patient returns in follow-up of her carotid disease.  She is about 3 years status post right carotid stent placement for high-grade stenosis.  She is doing well today.  She denies any focal neurologic symptoms. Specifically, the patient denies amaurosis fugax, speech or swallowing difficulties, or arm or leg weakness or numbness. Carotid duplex today reveals a widely patent right carotid stent and mild 1 to 39% left ICA stenosis.  Current Outpatient Medications  Medication Sig Dispense Refill   albuterol (PROVENTIL) (2.5 MG/3ML) 0.083% nebulizer solution TAKE 3 MLS BY NEBULIZATION 4 TIMES DAILY AS NEEDED FOR WHEEZING OR SHORTNESS OF BREATH (AS NEEDEDUP TO 4 TIMES PER DAY) 150 mL 0   albuterol (VENTOLIN HFA) 108 (90 Base) MCG/ACT inhaler INHALE 2 PUFFS BY MOUTH EVERY 6 HOURS AS NEEDED FOR WHEEZING AND FOR SHORTNESS OF BREATH 9 g 5   amLODipine (NORVASC) 5 MG tablet TAKE 1 TABLET BY MOUTH DAILY 100 tablet 2   aspirin EC 81 MG tablet Take 81 mg by mouth daily.     atenolol (TENORMIN) 50 MG tablet TAKE 1 TABLET BY MOUTH TWICE  DAILY 200 tablet 3   atorvastatin (LIPITOR) 80 MG tablet TAKE 1 TABLET BY MOUTH ONCE  DAILY 100 tablet 3   Budeson-Glycopyrrol-Formoterol (BREZTRI AEROSPHERE) 160-9-4.8 MCG/ACT AERO Inhale 2 puffs into the lungs in the morning and at bedtime. 10.7 g 0   buPROPion (WELLBUTRIN XL) 300 MG 24 hr tablet Take 1 tablet (300 mg total) by mouth daily. 90 tablet 3   Cholecalciferol (VITAMIN D) 2000 UNITS tablet Take 1,000 Units by mouth daily.      cyanocobalamin (VITAMIN B12) 1000 MCG tablet Take 1 tablet (1,000 mcg total) by mouth daily. 30 tablet 5   furosemide (LASIX) 20 MG tablet Take 1 tablet (20 mg total) by mouth daily as needed (swelling). 20 tablet 4    glipiZIDE (GLUCOTROL XL) 10 MG 24 hr tablet Take 1 tablet (10 mg total) by mouth daily. 90 tablet 1   glucose blood (ONETOUCH ULTRA) test strip (Onetouch Ultra Blue) Use strip daily to check blood sugar for type 2 diabetes (E11.9) 100 each 3   hydrOXYzine (ATARAX) 25 MG tablet Take 12.5 mg by mouth at bedtime.     Iron-FA-B Cmp-C-Biot-Probiotic (FUSION PLUS) CAPS Take 1 capsule by mouth daily. 30 capsule 5   lipase/protease/amylase (CREON) 36000 UNITS CPEP capsule Take 2 capsules with the first bite of each meal and 1 capsule with the first bite of each snack 240 capsule 5   lisinopril-hydrochlorothiazide (ZESTORETIC) 20-25 MG tablet TAKE 1 TABLET BY MOUTH DAILY 100 tablet 4   metFORMIN (GLUCOPHAGE-XR) 500 MG 24 hr tablet Take 1 tablet (500 mg total) by mouth daily with breakfast. 180 tablet 3   montelukast (SINGULAIR) 10 MG tablet TAKE 1 TABLET BY MOUTH  DAILY 100 tablet 3   omeprazole (PRILOSEC) 40 MG capsule TAKE 1 CAPSULE BY MOUTH DAILY 70 capsule 4   OneTouch Delica Lancets 33G MISC USE TO CHECK BLOOD SUGAR DAILY 100 each 3   OXYGEN Inhale 2 L into the lungs at bedtime. Use at night     valACYclovir (VALTREX) 1000 MG tablet 2 tablets twice a day for 1 day as needed for cold sores 12  tablet 2   No current facility-administered medications for this visit.    Past Medical History:  Diagnosis Date   Actinic keratosis    Anemia    Colon polyps    Depressive disorder 07/08/2007   Diabetes mellitus without complication (HCC)    GERD (gastroesophageal reflux disease)    Hemorrhoids    History of chicken pox    History of measles    History of mumps    Hypertension    Lung nodule 02/18/2015   No additional follow up needed per CT report 03/05/2015    Melanoma (HCC) 08/14/2020   right forearm, excised 09/17/2020 [facility]   SCC (squamous cell carcinoma), hand    excision SCC left forearm and left hand 09/05/21 Dr. 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 Keratoacanthoma type. [facility]    Past Surgical History:  Procedure Laterality Date   ABDOMINAL HYSTERECTOMY  1985   DUB; ovaries resected/ removed   BREAST BIOPSY Right 11/12/2014   fibroadenomatous   CARDIOVASCULAR STRESS TEST  02/2008   low risk   CAROTID PTA/STENT INTERVENTION Right 11/13/2019   Procedure: CAROTID PTA/STENT INTERVENTION;  Surgeon: Annice Needy, MD;  Location: [facility] INVASIVE CV LAB;  Service: Cardiovascular;  Laterality: Right;   COLONOSCOPY WITH PROPOFOL N/A 01/18/2018   Procedure: COLONOSCOPY WITH PROPOFOL;  Surgeon: Toney Reil, MD;  Location: [facility] ENDOSCOPY;  Service: Gastroenterology;  Laterality: N/A;   COLONOSCOPY WITH PROPOFOL N/A 02/18/2022   Procedure: COLONOSCOPY WITH PROPOFOL;  Surgeon: Toney Reil, MD;  Location: [facility] ENDOSCOPY;  Service: Gastroenterology;  Laterality: N/A;   DOPPLER ECHOCARDIOGRAPHY  01/2010   EF>55%, LV relaxation impairment consistent with diastolic dysfunction, mild TR and MR   ESOPHAGOGASTRODUODENOSCOPY  07/2009   +diffuse gastritis. Iftikhar   ESOPHAGOGASTRODUODENOSCOPY (EGD) WITH PROPOFOL N/A 01/18/2018   Procedure: ESOPHAGOGASTRODUODENOSCOPY (EGD) WITH PROPOFOL;  Surgeon: Toney Reil, MD;  Location[facility] ENDOSCOPY;  Service: Gastroenterology;  Laterality: N/A;   ESOPHAGOGASTRODUODENOSCOPY (EGD) WITH PROPOFOL N/A 02/18/2022   Procedure: ESOPHAGOGASTRODUODENOSCOPY (EGD) WITH PROPOFOL;  Surgeon: Toney Reil, MD;  Location: [facility] ENDOSCOPY;  Service: Gastroenterology;  Laterality: N/A;   GALLBLADDER SURGERY  1994   HAND SURGERY Right 2010   surgery x 2. Dr. Hyacinth Meeker   MELANOMA EXCISION Right 09/17/2020   Wide excision r forearm Dr. Stacey Drain, [facility]   NECK SURGERY  1998   Disc surgey on neck   Sinus Surgery     TUBAL LIGATION  1971      Social History   Tobacco Use   Smoking status: Every Day    Packs/day: 2.00    Years: 60.00    Additional pack years: 0.00    Total pack years: 120.00    Types: Cigarettes   Smokeless tobacco: Never   Tobacco comments:    7-8 cigarettes daily-11/25/2021    10 Cigarettes daily- 03/18/2022 khj  Vaping Use   Vaping Use: Never used  Substance Use Topics   Alcohol use: No    Alcohol/week: 0.0 standard drinks of alcohol   Drug use: No       Family History  Problem Relation Age of Onset   Hypertension Sister    COPD Sister    Thyroid disease Sister    Breast cancer  Sister 10   Thyroid disease Sister    Heart attack Mother    Hypertension Mother    Thyroid disease Mother        HYPERTHYROIDISM   GI Bleed Father    Lung cancer Brother    Breast cancer Sister    Hypertension Sister    Aneurysm Sister        in the back of her neck   Heart attack Brother        CABG     Allergies  Allergen Reactions   Bupropion Other (See Comments)    fatigue    REVIEW OF SYSTEMS (Negative unless checked)   Constitutional: [] Weight loss  [] Fever  [] Chills Cardiac: [] Chest pain   [] Chest pressure   [] Palpitations   [] Shortness of breath when laying flat   [] Shortness of breath at rest   [] Shortness of breath with exertion. Vascular:  [] Pain in legs with walking   [] Pain in legs at rest   [] Pain in legs when laying flat   [] Claudication   [] Pain in feet when walking  [] Pain in feet at rest  [] Pain in feet when laying flat   [] History of DVT   [] Phlebitis   [] Swelling in legs   [] Varicose veins   [] Non-healing ulcers Pulmonary:   [] Uses home oxygen   [] Productive cough   [] Hemoptysis   [] Wheeze  [x] COPD   [x] Asthma Neurologic:  [] Dizziness  [] Blackouts   [] Seizures   [] History of stroke   [] History of TIA  [] Aphasia   [] Temporary blindness   [] Dysphagia   [] Weakness or numbness in arms   [] Weakness or numbness in legs Musculoskeletal:  [x] Arthritis   [] Joint swelling   [] Joint pain    [] Low back pain Hematologic:  [] Easy bruising  [] Easy bleeding   [] Hypercoagulable state   [x] Anemic  [] Hepatitis Gastrointestinal:  [] Blood in stool   [] Vomiting blood  [] Gastroesophageal reflux/heartburn   [] Difficulty swallowing. Genitourinary:  [] Chronic kidney disease   [] Difficult urination  [] Frequent urination  [] Burning with urination   [] Blood in urine Skin:  [] Rashes   [] Ulcers   [] Wounds Psychological:  [x] History of anxiety   []  History of major depression.   Physical Examination  Vitals:   09/22/22 1113  BP: 118/67  Pulse: 65  Resp: 18  Weight: 149 lb (67.6 kg)  Height: 5\' 6"  (1.676 m)   Body mass index is 24.05 kg/m. Gen:  WD/WN, NAD Head: [place]/AT, No temporalis wasting. Ear/Nose/Throat: Hearing grossly intact, nares w/o erythema or drainage, trachea midline Eyes: Conjunctiva clear. Sclera non-icteric Neck: Supple.  No bruit  Pulmonary:  Good air movement, equal and clear to auscultation bilaterally.  Cardiac: RRR, No JVD Vascular:  Vessel Right Left  Radial Palpable Palpable               Musculoskeletal: M/S 5/5 throughout.  No deformity or atrophy. trace edema. Neurologic: CN 2-12 intact. Sensation grossly intact in extremities.  Symmetrical.  Speech is fluent. Motor exam as listed above. Psychiatric: Judgment intact, Mood & affect appropriate for pt's clinical situation. Dermatologic: No rashes or ulcers noted.  No cellulitis or open wounds.    CBC Lab Results  Component Value Date   WBC 10.4 07/27/2022   HGB 9.5 (L) 07/27/2022   HCT 29.0 (L) 07/27/2022   MCV 87 07/27/2022   PLT 276 07/27/2022    BMET    Component Value Date/Time   NA 141 06/01/2022 1042   K 4.8 06/01/2022 1042   CL 101 06/01/2022 1042  CO2 20 06/01/2022 1042   GLUCOSE 123 (H) 06/01/2022 1042   GLUCOSE 101 (H) 11/14/2019 0419   BUN 22 06/01/2022 1042   CREATININE 1.52 (H) 06/01/2022 1042   CALCIUM 10.4 (H) 06/01/2022 1042   GFRNONAA 39 (L) 11/14/2019 0419   GFRAA 45  (L) 11/14/2019 0419   CrCl cannot be calculated (Patient's most recent lab result is older than the maximum 21 days allowed.).  COAG No results found for: "INR", "PROTIME"  Radiology No results found.   Assessment/Plan Essential hypertension blood pressure control important in reducing the progression of atherosclerotic disease. On appropriate oral medications.     Diabetes mellitus with neurological manifestations (HCC) blood glucose control important in reducing the progression of atherosclerotic disease. Also, involved in wound healing. On appropriate medications.   Chronic kidney disease (CKD), stage III (moderate) Follow-up with duplex and avoid contrast unless absolutely necessary   Carotid stenosis Carotid duplex today reveals a widely patent right carotid stent and mild 1 to 39% left ICA stenosis.  Doing well.  Continue current medical regimen.  Recheck in 1 year.   Festus Barren, MD  09/22/2022 11:51 AM    This note was created with Dragon medical transcription system.  Any errors from dictation are purely unintentional

## 2022-09-24 ENCOUNTER — Ambulatory Visit: Payer: Medicare Other | Admitting: Dermatology

## 2022-09-24 DIAGNOSIS — J449 Chronic obstructive pulmonary disease, unspecified: Secondary | ICD-10-CM | POA: Diagnosis not present

## 2022-10-12 ENCOUNTER — Other Ambulatory Visit: Payer: Self-pay | Admitting: Family Medicine

## 2022-10-15 NOTE — Progress Notes (Signed)
Required   No additional encounter notes found.   CCM Enrollment Status has not been documented

## 2022-10-21 ENCOUNTER — Other Ambulatory Visit: Payer: Medicare Other

## 2022-10-21 ENCOUNTER — Other Ambulatory Visit: Payer: Self-pay | Admitting: Family Medicine

## 2022-10-21 DIAGNOSIS — E2839 Other primary ovarian failure: Secondary | ICD-10-CM

## 2022-10-25 DIAGNOSIS — J449 Chronic obstructive pulmonary disease, unspecified: Secondary | ICD-10-CM | POA: Diagnosis not present

## 2022-11-24 DIAGNOSIS — J449 Chronic obstructive pulmonary disease, unspecified: Secondary | ICD-10-CM | POA: Diagnosis not present

## 2022-12-09 ENCOUNTER — Other Ambulatory Visit: Payer: Medicare Other

## 2022-12-10 ENCOUNTER — Ambulatory Visit: Payer: Medicare Other | Admitting: Dermatology

## 2022-12-10 DIAGNOSIS — Z85820 Personal history of malignant melanoma of skin: Secondary | ICD-10-CM

## 2022-12-10 DIAGNOSIS — L821 Other seborrheic keratosis: Secondary | ICD-10-CM | POA: Diagnosis not present

## 2022-12-10 DIAGNOSIS — L570 Actinic keratosis: Secondary | ICD-10-CM

## 2022-12-10 DIAGNOSIS — Z85828 Personal history of other malignant neoplasm of skin: Secondary | ICD-10-CM | POA: Diagnosis not present

## 2022-12-10 DIAGNOSIS — W908XXA Exposure to other nonionizing radiation, initial encounter: Secondary | ICD-10-CM | POA: Diagnosis not present

## 2022-12-10 NOTE — Patient Instructions (Addendum)
Cryotherapy Aftercare  Wash gently with soap and water everyday.   Apply Vaseline and Band-Aid daily until healed.   Recommend Keralyt moisturizer. If unable to get can use AmLactin. Samples given today.   Melanoma ABCDEs  Melanoma is the most dangerous type of skin cancer, and is the leading cause of death from skin disease.  You are more likely to develop melanoma if you: Have light-colored skin, light-colored eyes, or red or blond hair Spend a lot of time in the sun Tan regularly, either outdoors or in a tanning bed Have had blistering sunburns, especially during childhood Have a close family member who has had a melanoma Have atypical moles or large birthmarks  Early detection of melanoma is key since treatment is typically straightforward and cure rates are extremely high if we catch it early.   The first sign of melanoma is often a change in a mole or a new dark spot.  The ABCDE system is a way of remembering the signs of melanoma.  A for asymmetry:  The two halves do not match. B for border:  The edges of the growth are irregular. C for color:  A mixture of colors are present instead of an even brown color. D for diameter:  Melanomas are usually (but not always) greater than 6mm - the size of a pencil eraser. E for evolution:  The spot keeps changing in size, shape, and color.  Please check your skin once per month between visits. You can use a small mirror in front and a large mirror behind you to keep an eye on the back side or your body.   If you see any new or changing lesions before your next follow-up, please call to schedule a visit.  Please continue daily skin protection including broad spectrum sunscreen SPF 30+ to sun-exposed areas, reapplying every 2 hours as needed when you're outdoors.    Due to recent changes in healthcare laws, you may see results of your pathology and/or laboratory studies on MyChart before the doctors have had a chance to review them. We  understand that in some cases there may be results that are confusing or concerning to you. Please understand that not all results are received at the same time and often the doctors may need to interpret multiple results in order to provide you with the best plan of care or course of treatment. Therefore, we ask that you please give [place] 2 business days to thoroughly review all your results before contacting the office for clarification. Should we see a critical lab result, you will be contacted sooner.   If You Need Anything After Your Visit  If you have any questions or concerns for your doctor, please call our main line at [phone] and press option 4 to reach your doctor's medical assistant. If no one answers, please leave a voicemail as directed and we will return your call as soon as possible. Messages left after 4 pm will be answered the following business day.   You may also send [place] a message via MyChart. We typically respond to MyChart messages within 1-2 business days.  For prescription refills, please ask your pharmacy to contact our office. Our fax number is [phone].  If you have an urgent issue when the clinic is closed that cannot wait until the next business day, you can page your doctor at the number below.    Please note that while we do our best to be available for urgent issues outside of  office hours, we are not available 24/7.   If you have an urgent issue and are unable to reach [place], you may choose to seek medical care at your doctor's office, retail clinic, urgent care center, or emergency room.  If you have a medical emergency, please immediately call 911 or go to the emergency department.  Pager Numbers  - Dr. Gwen Pounds: [phone]  - Dr. Neale Burly: [phone]  - Dr. Roseanne Reno: [phone]  In the event of inclement weather, please call our main line at [phone] for an update on the status of any delays or closures.  Dermatology Medication Tips: Please  keep the boxes that topical medications come in in order to help keep track of the instructions about where and how to use these. Pharmacies typically print the medication instructions only on the boxes and not directly on the medication tubes.   If your medication is too expensive, please contact our office at [phone] option 4 or send [place] a message through MyChart.   We are unable to tell what your co-pay for medications will be in advance as this is different depending on your insurance coverage. However, we may be able to find a substitute medication at lower cost or fill out paperwork to get insurance to cover a needed medication.   If a prior authorization is required to get your medication covered by your insurance company, please allow [place] 1-2 business days to complete this process.  Drug prices often vary depending on where the prescription is filled and some pharmacies may offer cheaper prices.  The website www.goodrx.com contains coupons for medications through different pharmacies. The prices here do not account for what the cost may be with help from insurance (it may be cheaper with your insurance), but the website can give you the price if you did not use any insurance.  - You can print the associated coupon and take it with your prescription to the pharmacy.  - You may also stop by our office during regular business hours and pick up a GoodRx coupon card.  - If you need your prescription sent electronically to a different pharmacy, notify our office through [facility] or by phone at [phone] option 4.

## 2022-12-10 NOTE — Progress Notes (Signed)
   Follow-Up Visit   Subjective  Erica Rivas is a 82 y.o. female who presents for the following: scaly spots at face and hand. None are bleeding or sore. Spots at face come and go.   Patient does have hx of melanoma, SCC. Patient sees [facility] for skin exams.    The following portions of the chart were reviewed this encounter and updated as appropriate: medications, allergies, medical history  Review of Systems:  No other skin or systemic complaints except as noted in HPI or Assessment and Plan.  Objective  Well appearing patient in no apparent distress; mood and affect are within normal limits.   A focused examination was performed of the following areas: Arms, hands, chest, face  Relevant exam findings are noted in the Assessment and Plan.  right dorsal hand x 4, right distal forearm x 4, left dorsal hand x 3, left distal forearm x 2 (13) Erythematous thin papules/macules with gritty scale.  18 x 14 mm at left medial distal forearm, favour large AK vs SCCis    Assessment & Plan     AK (actinic keratosis) (13) right dorsal hand x 4, right distal forearm x 4, left dorsal hand x 3, left distal forearm x 2  Actinic keratoses are precancerous spots that appear secondary to cumulative UV radiation exposure/sun exposure over time. They are chronic with expected duration over 1 year. A portion of actinic keratoses will progress to squamous cell carcinoma of the skin. It is not possible to reliably predict which spots will progress to skin cancer and so treatment is recommended to prevent development of skin cancer.  Recommend daily broad spectrum sunscreen SPF 30+ to sun-exposed areas, reapply every 2 hours as needed.  Recommend staying in the shade or wearing long sleeves, sun glasses (UVA+UVB protection) and wide brim hats (4-inch brim around the entire circumference of the hat). Call for new or changing lesions.  Offered biopsy of likely AK vs SCCis. Patient prefers  empiric cryotherapy and recheck at follow up. Recheck AK at left medial distal forearm on follow up. Consider biopsy if indicated.   Offered TBSE given skin cancer history. Patient declined due to doing self-exams and going to [facility] for exams  Recommend Keralyt moisturizer on hyperkeratotic AKs on hands and forearms. If unable to get can use AmLactin. Samples given today.   Seborrheic keratoses  SEBORRHEIC KERATOSIS - Stuck-on, waxy, tan-brown papules and/or plaques on chest - Benign-appearing - Discussed benign etiology and prognosis. - Observe - Call for any changes   Return in about 6 months (around 06/12/2023) for AK follow up.  Anise Salvo, RMA, am acting as scribe for Elie Goody, MD .   Documentation: I have reviewed the above documentation for accuracy and completeness, and I agree with the above.  Elie Goody, MD

## 2022-12-14 ENCOUNTER — Ambulatory Visit (INDEPENDENT_AMBULATORY_CARE_PROVIDER_SITE_OTHER): Payer: Medicare Other

## 2022-12-14 VITALS — BP 104/60 | Ht 66.0 in | Wt 145.3 lb

## 2022-12-14 DIAGNOSIS — Z0000 Encounter for general adult medical examination without abnormal findings: Secondary | ICD-10-CM | POA: Diagnosis not present

## 2022-12-14 NOTE — Progress Notes (Signed)
Subjective:   Erica Rivas is a 82 y.o. female who presents for Medicare Annual (Subsequent) preventive examination.  Visit Complete: In person  Patient Medicare AWV questionnaire was completed by the patient on 9not done); I have confirmed that all information answered by patient is correct and no changes since this date.  Review of Systems     Cardiac Risk Factors include: advanced age (>64men, >27 women);diabetes mellitus;hypertension;sedentary lifestyle;smoking/ tobacco exposure     Objective:    Today's Vitals   12/14/22 1504  BP: 104/60  Weight: 145 lb 4.8 oz (65.9 kg)  Height: 5\' 6"  (1.676 m)   Body mass index is 23.45 kg/m.     12/14/2022    3:17 PM 02/18/2022    9:52 AM 11/13/2019    9:18 AM 01/18/2018    1:11 PM 01/19/2017   10:41 AM 04/16/2016   10:36 AM  Advanced Directives  Does Patient Have a Medical Advance Directive? No No No No No No  Would patient like information on creating a medical advance directive?   No - Patient declined No - Patient declined No - Patient declined No - Patient declined    Current Medications (verified) Outpatient Encounter Medications as of 12/14/2022  Medication Sig   albuterol (PROVENTIL) (2.5 MG/3ML) 0.083% nebulizer solution TAKE 3 MLS BY NEBULIZATION 4 TIMES DAILY AS NEEDED FOR WHEEZING OR SHORTNESS OF BREATH (AS NEEDEDUP TO 4 TIMES PER DAY)   albuterol (VENTOLIN HFA) 108 (90 Base) MCG/ACT inhaler INHALE 2 PUFFS BY MOUTH EVERY 6 HOURS AS NEEDED FOR WHEEZING AND FOR SHORTNESS OF BREATH   amLODipine (NORVASC) 5 MG tablet TAKE 1 TABLET BY MOUTH DAILY   aspirin EC 81 MG tablet Take 81 mg by mouth daily.   atenolol (TENORMIN) 50 MG tablet TAKE 1 TABLET BY MOUTH TWICE  DAILY   atorvastatin (LIPITOR) 80 MG tablet TAKE 1 TABLET BY MOUTH ONCE  DAILY   Budeson-Glycopyrrol-Formoterol (BREZTRI AEROSPHERE) 160-9-4.8 MCG/ACT AERO Inhale 2 puffs into the lungs in the morning and at bedtime.   buPROPion (WELLBUTRIN XL) 300 MG 24 hr tablet  Take 1 tablet (300 mg total) by mouth daily.   Cholecalciferol (VITAMIN D) 2000 UNITS tablet Take 1,000 Units by mouth daily.    cyanocobalamin (VITAMIN B12) 1000 MCG tablet Take 1 tablet (1,000 mcg total) by mouth daily.   furosemide (LASIX) 20 MG tablet Take 1 tablet (20 mg total) by mouth daily as needed (swelling).   glipiZIDE (GLUCOTROL XL) 10 MG 24 hr tablet TAKE 1 TABLET BY MOUTH DAILY   glucose blood (ONETOUCH ULTRA) test strip (Onetouch Ultra Blue) Use strip daily to check blood sugar for type 2 diabetes (E11.9)   hydrOXYzine (ATARAX) 25 MG tablet Take 12.5 mg by mouth at bedtime.   Iron-FA-B Cmp-C-Biot-Probiotic (FUSION PLUS) CAPS Take 1 capsule by mouth daily.   lipase/protease/amylase (CREON) 36000 UNITS CPEP capsule Take 2 capsules with the first bite of each meal and 1 capsule with the first bite of each snack   lisinopril-hydrochlorothiazide (ZESTORETIC) 20-25 MG tablet TAKE 1 TABLET BY MOUTH DAILY   metFORMIN (GLUCOPHAGE-XR) 500 MG 24 hr tablet Take 1 tablet (500 mg total) by mouth daily with breakfast.   montelukast (SINGULAIR) 10 MG tablet TAKE 1 TABLET BY MOUTH  DAILY   omeprazole (PRILOSEC) 40 MG capsule TAKE 1 CAPSULE BY MOUTH DAILY   OneTouch Delica Lancets 33G MISC USE TO CHECK BLOOD SUGAR DAILY   OXYGEN Inhale 2 L into the lungs at bedtime. Use at  night   valACYclovir (VALTREX) 1000 MG tablet 2 tablets twice a day for 1 day as needed for cold sores   No facility-administered encounter medications on file as of 12/14/2022.    Allergies (verified) Bupropion   History: Past Medical History:  Diagnosis Date   Actinic keratosis    Anemia    Colon polyps    Depressive disorder 07/08/2007   Diabetes mellitus without complication (HCC)    GERD (gastroesophageal reflux disease)    Hemorrhoids    History of chicken pox    History of measles    History of mumps    Hypertension    Lung nodule 02/18/2015   No additional follow up needed per CT report 03/05/2015     Melanoma (HCC) 08/14/2020   right forearm, excised 09/17/2020 [facility]   SCC (squamous cell carcinoma), hand    excision SCC left forearm and left hand 09/05/21 Dr. 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 Keratoacanthoma type. EDC   Past Surgical History:  Procedure Laterality Date   ABDOMINAL HYSTERECTOMY  1985   DUB; ovaries resected/ removed   BREAST BIOPSY Right 11/12/2014   fibroadenomatous   CARDIOVASCULAR STRESS TEST  02/2008   low risk   CAROTID PTA/STENT INTERVENTION Right 11/13/2019   Procedure: CAROTID PTA/STENT INTERVENTION;  Surgeon: Annice Needy, MD;  Location: [facility] INVASIVE CV LAB;  Service: Cardiovascular;  Laterality: Right;   COLONOSCOPY WITH PROPOFOL N/A 01/18/2018   Procedure: COLONOSCOPY WITH PROPOFOL;  Surgeon: Toney Reil, MD;  Location[facility] ENDOSCOPY;  Service: Gastroenterology;  Laterality: N/A;   COLONOSCOPY WITH PROPOFOL N/A 02/18/2022   Procedure: COLONOSCOPY WITH PROPOFOL;  Surgeon: Toney Reil, MD;  Location: [facility] ENDOSCOPY;  Service: Gastroenterology;  Laterality: N/A;   DOPPLER ECHOCARDIOGRAPHY  01/2010   EF>55%, LV relaxation impairment consistent with diastolic dysfunction, mild TR and MR   ESOPHAGOGASTRODUODENOSCOPY  07/2009   +diffuse gastritis. Iftikhar   ESOPHAGOGASTRODUODENOSCOPY (EGD) WITH PROPOFOL N/A 01/18/2018   Procedure: ESOPHAGOGASTRODUODENOSCOPY (EGD) WITH PROPOFOL;  Surgeon: Toney Reil, MD;  Location: [facility] ENDOSCOPY;  Service: Gastroenterology;  Laterality: N/A;   ESOPHAGOGASTRODUODENOSCOPY (EGD) WITH PROPOFOL N/A 02/18/2022   Procedure: ESOPHAGOGASTRODUODENOSCOPY (EGD) WITH PROPOFOL;  Surgeon: Toney Reil, MD;  Location: [facility] ENDOSCOPY;  Service: Gastroenterology;  Laterality: N/A;   GALLBLADDER SURGERY  1994   HAND SURGERY  Right 2010   surgery x 2. Dr. Hyacinth Meeker   MELANOMA EXCISION Right 09/17/2020   Wide excision r forearm Dr. Stacey Drain, [facility]   NECK SURGERY  1998   Disc surgey on neck   Sinus Surgery     TUBAL LIGATION  1971   Family History  Problem Relation Age of Onset   Hypertension Sister    COPD Sister    Thyroid disease Sister    Breast cancer Sister 67   Thyroid disease Sister    Heart attack Mother    Hypertension Mother    Thyroid disease Mother        HYPERTHYROIDISM   GI Bleed Father    Lung cancer Brother    Breast cancer Sister    Hypertension Sister    Aneurysm Sister        in the back of her neck   Heart attack Brother  CABG   Social History   Socioeconomic History   Marital status: Married    Spouse name: Not on file   Number of children: 2   Years of education: Not on file   Highest education level: 10th grade  Occupational History   Occupation: Retired    Comment: Formerly worked at [employer]. Retired in 2007  Tobacco Use   Smoking status: Every Day    Current packs/day: 2.00    Average packs/day: 2.0 packs/day for 60.0 years (120.0 ttl pk-yrs)    Types: Cigarettes   Smokeless tobacco: Never   Tobacco comments:    7-8 cigarettes daily-11/25/2021    10 Cigarettes daily- 03/18/2022 khj  Vaping Use   Vaping status: Never Used  Substance and Sexual Activity   Alcohol use: No    Alcohol/week: 0.0 standard drinks of alcohol   Drug use: No   Sexual activity: Not on file  Other Topics Concern   Not on file  Social History Narrative   Not on file   Social Determinants of Health   Financial Resource Strain: Low Risk  (12/14/2022)   Overall Financial Resource Strain (CARDIA)    Difficulty of Paying Living Expenses: Not hard at all  Food Insecurity: No Food Insecurity (12/14/2022)   Hunger Vital Sign    Worried About Running Out of Food in the Last Year: Never true    Ran Out of Food in the Last Year: Never true  Transportation Needs:  No Transportation Needs (12/14/2022)   PRAPARE - Administrator, Civil Service (Medical): No    Lack of Transportation (Non-Medical): No  Physical Activity: Inactive (12/14/2022)   Exercise Vital Sign    Days of Exercise per Week: 0 days    Minutes of Exercise per Session: 0 min  Stress: No Stress Concern Present (12/14/2022)   [employer] of Occupational Health - Occupational Stress Questionnaire    Feeling of Stress : Not at all  Social Connections: Moderately Integrated (12/14/2022)   Social Connection and Isolation Panel [NHANES]    Frequency of Communication with Friends and Family: More than three times a week    Frequency of Social Gatherings with Friends and Family: Once a week    Attends Religious Services: More than 4 times per year    Active Member of [employer] or Organizations: No    Attends Banker Meetings: Never    Marital Status: Married    Tobacco Counseling Ready to quit: Not Answered Counseling given: Not Answered Tobacco comments: 7-8 cigarettes daily-11/25/2021 10 Cigarettes daily- 03/18/2022 khj   Clinical Intake:  Pre-visit preparation completed: No  Pain : No/denies pain     BMI - recorded: 23.45 Nutritional Status: BMI of 19-24  Normal Nutritional Risks: None Diabetes: Yes CBG done?: No Did pt. bring in CBG monitor from home?: No  How often do you need to have someone help you when you read instructions, pamphlets, or other written materials from your doctor or pharmacy?: 1 - Never  Interpreter Needed?: No  Comments: lives with husband Information entered by :: B.Katoria Yetman,LPN   Activities of Daily Living    12/14/2022    3:18 PM 08/26/2022   10:09 AM  In your present state of health, do you have any difficulty performing the following activities:  Hearing? 0 0  Vision? 1 1  Difficulty concentrating or making decisions? 0 1  Walking or climbing stairs? 1 1  Dressing or bathing? 0 0  Doing errands, shopping? 1  0   Preparing Food and eating ? N   Using the Toilet? N   In the past six months, have you accidently leaked urine? Y   Do you have problems with loss of bowel control? N   Managing your Medications? N   Managing your Finances? N   Housekeeping or managing your Housekeeping? Y     Patient Care Team: Malva Limes, MD as PCP - General (Family Medicine) Dasher, Cliffton Asters, MD as Consulting Physician (Dermatology) Sherrie George, MD as Consulting Physician (Ophthalmology) Harrel Carina, MD as Referring Physician (Surgery) Salena Saner, MD as Consulting Physician (Pulmonary Disease) Pa, [facility]  Indicate any recent Medical Services you may have received from other than [facility] providers in the past year (date may be approximate).     Assessment:   This is a routine wellness examination for October.  Hearing/Vision screen Hearing Screening - Comments:: Adequate hearing Vision Screening - Comments:: Adequate vision;does not wear glasses Dr Molli Hazard  Dietary issues and exercise activities discussed:     Goals Addressed             This Visit's Progress    Increase water intake   On track    Starting 04/16/16, I will continue to drink 6 glasses of water a day.       Depression Screen    12/14/2022    3:13 PM 08/26/2022   10:09 AM 05/25/2022    4:39 PM 02/03/2022   11:05 AM 01/08/2022   12:12 PM 10/07/2021   10:54 AM 06/04/2021    9:51 AM  PHQ 2/9 Scores  PHQ - 2 Score 1 1 1 5  0 3 2  PHQ- 9 Score  3 6 12  7 4     Fall Risk    12/14/2022    3:08 PM 08/26/2022   10:09 AM 05/25/2022    4:39 PM 10/07/2021   10:54 AM 01/31/2021    1:50 PM  Fall Risk   Falls in the past year? 0 1 1 0 0  Number falls in past yr: 1 1 1  0 0  Injury with Fall? 0 0 1 0 0  Risk for fall due to : Impaired balance/gait  History of fall(s)  No Fall Risks  Follow up Education provided;Falls prevention discussed  Falls evaluation completed Falls evaluation completed     MEDICARE  RISK AT HOME:  Medicare Risk at Home - 12/14/22 1509     Any stairs in or around the home? Yes    If so, are there any without handrails? Yes   has ramps   Home free of loose throw rugs in walkways, pet beds, electrical cords, etc? Yes    Adequate lighting in your home to reduce risk of falls? Yes    Life alert? No    Use of a cane, walker or w/c? No    Grab bars in the bathroom? Yes    Shower chair or bench in shower? Yes    Elevated toilet seat or a handicapped toilet? No             TIMED UP AND GO:  Was the test performed?  Yes  Length of time to ambulate 10 feet: 12 sec Gait slow and steady without use of assistive device    Cognitive Function:        12/14/2022    3:25 PM 04/16/2016   10:40 AM  6CIT Screen  What Year? 0 points 0  points  What month? 0 points 0 points  What time? 0 points 0 points  Count back from 20 0 points 0 points  Months in reverse 0 points 0 points  Repeat phrase 0 points 4 points  Total Score 0 points 4 points    Immunizations Immunization History  Administered Date(s) Administered   Covid-19, Mrna,Vaccine(Spikevax)61yrs and older 03/23/2022   Fluad Quad(high Dose 65+) 01/25/2019, 01/26/2020, 02/13/2021   Influenza, High Dose Seasonal PF 02/18/2015, 03/02/2016, 01/19/2017, 03/12/2018   Moderna SARS-COV2 Booster Vaccination 09/06/2019, 03/15/2020   Moderna Sars-Covid-2 Vaccination 05/31/2019, 06/28/2019   Pfizer Covid-19 Vaccine Bivalent Booster 26yrs & up 02/13/2021   Pneumococcal Conjugate-13 10/24/2013   Pneumococcal Polysaccharide-23 04/01/2005, 05/18/2005, 04/26/2012   Tdap 01/01/2012   Zoster, Live 01/01/2012    TDAP status: Up to date  Flu Vaccine status: Up to date  Pneumococcal vaccine status: Up to date  Covid-19 vaccine status: Completed vaccines  Qualifies for Shingles Vaccine? Yes   Zostavax completed Yes  #1 Shingrix Completed?: No.    Education has been provided regarding the importance of this vaccine.  Patient has been advised to call insurance company to determine out of pocket expense if they have not yet received this vaccine. Advised may also receive vaccine at local pharmacy or Health Dept. Verbalized acceptance and understanding.  Screening Tests Health Maintenance  Topic Date Due   Zoster Vaccines- Shingrix (1 of 2) 06/08/1959   DEXA SCAN  03/04/2018   Lung Cancer Screening  08/21/2021   DTaP/Tdap/Td (2 - Td or Tdap) 12/31/2021   COVID-19 Vaccine (5 - 2023-24 season) 05/18/2022   INFLUENZA VACCINE  12/17/2022   HEMOGLOBIN A1C  02/25/2023   Diabetic kidney evaluation - eGFR measurement  06/02/2023   Diabetic kidney evaluation - Urine ACR  06/02/2023   OPHTHALMOLOGY EXAM  08/31/2023   Medicare Annual Wellness (AWV)  12/14/2023   Pneumonia Vaccine 7+ Years old  Completed   HPV VACCINES  Aged Out    Health Maintenance  Health Maintenance Due  Topic Date Due   Zoster Vaccines- Shingrix (1 of 2) 06/08/1959   DEXA SCAN  03/04/2018   Lung Cancer Screening  08/21/2021   DTaP/Tdap/Td (2 - Td or Tdap) 12/31/2021   COVID-19 Vaccine (5 - 2023-24 season) 05/18/2022    Colorectal cancer screening: No longer required.   Mammogram status: No longer required due to age.  Bone Density status: Completed yes. Results reflect: Bone density results: OSTEOPENIA. Repeat every 3-5 years. Has scheduled for 12/16/2022  Lung Cancer Screening: (Low Dose CT Chest recommended if Age 46-80 years, 20 pack-year currently smoking OR have quit w/in 15years.) does qualify.   Lung Cancer Screening Referral: pt declined  Additional Screening:  Hepatitis C Screening: does not qualify; Completed yes  Vision Screening: Recommended annual ophthalmology exams for early detection of glaucoma and other disorders of the eye. Is the patient up to date with their annual eye exam?  Yes  Who is the provider or what is the name of the office in which the patient attends annual eye exams? Dr Minda Meo If  pt is not established with a provider, would they like to be referred to a provider to establish care? No .   Dental Screening: Recommended annual dental exams for proper oral hygiene  Diabetic Foot Exam: Diabetic Foot Exam: Overdue, Pt has been advised about the importance in completing this exam. Pt is scheduled for diabetic foot exam on 01/26/2023 w/PCP.  Community Resource Referral / Chronic Care Management: CRR required  this visit?  No   CCM required this visit?  No     Plan:     I have personally reviewed and noted the following in the patient's chart:   Medical and social history Use of alcohol, tobacco or illicit drugs  Current medications and supplements including opioid prescriptions. Patient is not currently taking opioid prescriptions. Functional ability and status Nutritional status Physical activity Advanced directives List of other physicians Hospitalizations, surgeries, and ER visits in previous 12 months Vitals Screenings to include cognitive, depression, and falls Referrals and appointments  In addition, I have reviewed and discussed with patient certain preventive protocols, quality metrics, and best practice recommendations. A written personalized care plan for preventive services as well as general preventive health recommendations were provided to patient.     Sue Lush, LPN   1/61/0960   After Visit Summary: (MyChart) Due to this being a telephonic visit, the after visit summary with patients personalized plan was offered to patient via MyChart  Printed and given pt a copy of AWV  Nurse Notes: The patient states she is doing well and has no concerns or questions at this time.

## 2022-12-14 NOTE — Patient Instructions (Addendum)
[name] , Thank you for taking time to come for your Medicare Wellness Visit. I appreciate your ongoing commitment to your health goals. Please review the following plan we discussed and let me know if I can assist you in the future.   Referrals/Orders/Follow-Ups/Clinician Recommendations: Pt to hydrate as BP little low   This is a list of the screening recommended for you and due dates:  Health Maintenance  Topic Date Due   Zoster (Shingles) Vaccine (1 of 2) 06/08/1959   DEXA scan (bone density measurement)  03/04/2018   Screening for Lung Cancer  08/21/2021   DTaP/Tdap/Td vaccine (2 - Td or Tdap) 12/31/2021   COVID-19 Vaccine (5 - 2023-24 season) 05/18/2022   Flu Shot  12/17/2022   Hemoglobin A1C  02/25/2023   Yearly kidney function blood test for diabetes  06/02/2023   Yearly kidney health urinalysis for diabetes  06/02/2023   Eye exam for diabetics  08/31/2023   Medicare Annual Wellness Visit  12/14/2023   Pneumonia Vaccine  Completed   HPV Vaccine  Aged Out    Advanced directives: (Declined) Advance directive discussed with you today. Even though you declined this today, please call our office should you change your mind, and we can give you the proper paperwork for you to fill out.  Next Medicare Annual Wellness Visit scheduled for next year: Yes 12/15/2023@ 3pm in person  Preventive Care 65 Years and Older, Female Preventive care refers to lifestyle choices and visits with your health care provider that can promote health and wellness. What does preventive care include? A yearly physical exam. This is also called an annual well check. Dental exams once or twice a year. Routine eye exams. Ask your health care provider how often you should have your eyes checked. Personal lifestyle choices, including: Daily care of your teeth and gums. Regular physical activity. Eating a healthy diet. Avoiding tobacco and drug use. Limiting alcohol use. Practicing safe sex. Taking  low-dose aspirin every day. Taking vitamin and mineral supplements as recommended by your health care provider. What happens during an annual well check? The services and screenings done by your health care provider during your annual well check will depend on your age, overall health, lifestyle risk factors, and family history of disease. Counseling  Your health care provider may ask you questions about your: Alcohol use. Tobacco use. Drug use. Emotional well-being. Home and relationship well-being. Sexual activity. Eating habits. History of falls. Memory and ability to understand (cognition). Work and work Astronomer. Reproductive health. Screening  You may have the following tests or measurements: Height, weight, and BMI. Blood pressure. Lipid and cholesterol levels. These may be checked every 5 years, or more frequently if you are over 41 years old. Skin check. Lung cancer screening. You may have this screening every year starting at age 31 if you have a 30-pack-year history of smoking and currently smoke or have quit within the past 15 years. Fecal occult blood test (FOBT) of the stool. You may have this test every year starting at age 34. Flexible sigmoidoscopy or colonoscopy. You may have a sigmoidoscopy every 5 years or a colonoscopy every 10 years starting at age 75. Hepatitis C blood test. Hepatitis B blood test. Sexually transmitted disease (STD) testing. Diabetes screening. This is done by checking your blood sugar (glucose) after you have not eaten for a while (fasting). You may have this done every 1-3 years. Bone density scan. This is done to screen for osteoporosis. You may have this  done starting at age 5. Mammogram. This may be done every 1-2 years. Talk to your health care provider about how often you should have regular mammograms. Talk with your health care provider about your test results, treatment options, and if necessary, the need for more tests. Vaccines   Your health care provider may recommend certain vaccines, such as: Influenza vaccine. This is recommended every year. Tetanus, diphtheria, and acellular pertussis (Tdap, Td) vaccine. You may need a Td booster every 10 years. Zoster vaccine. You may need this after age 8. Pneumococcal 13-valent conjugate (PCV13) vaccine. One dose is recommended after age 18. Pneumococcal polysaccharide (PPSV23) vaccine. One dose is recommended after age 64. Talk to your health care provider about which screenings and vaccines you need and how often you need them. This information is not intended to replace advice given to you by your health care provider. Make sure you discuss any questions you have with your health care provider. Document Released: 05/31/2015 Document Revised: 01/22/2016 Document Reviewed: 03/05/2015 Elsevier Interactive Patient Education  2017 [employer].  Fall Prevention in the Home Falls can cause injuries. They can happen to people of all ages. There are many things you can do to make your home safe and to help prevent falls. What can I do on the outside of my home? Regularly fix the edges of walkways and driveways and fix any cracks. Remove anything that might make you trip as you walk through a door, such as a raised step or threshold. Trim any bushes or trees on the path to your home. Use bright outdoor lighting. Clear any walking paths of anything that might make someone trip, such as rocks or tools. Regularly check to see if handrails are loose or broken. Make sure that both sides of any steps have handrails. Any raised decks and porches should have guardrails on the edges. Have any leaves, snow, or ice cleared regularly. Use sand or salt on walking paths during winter. Clean up any spills in your garage right away. This includes oil or grease spills. What can I do in the bathroom? Use night lights. Install grab bars by the toilet and in the tub and shower. Do not use towel  bars as grab bars. Use non-skid mats or decals in the tub or shower. If you need to sit down in the shower, use a plastic, non-slip stool. Keep the floor dry. Clean up any water that spills on the floor as soon as it happens. Remove soap buildup in the tub or shower regularly. Attach bath mats securely with double-sided non-slip rug tape. Do not have throw rugs and other things on the floor that can make you trip. What can I do in the bedroom? Use night lights. Make sure that you have a light by your bed that is easy to reach. Do not use any sheets or blankets that are too big for your bed. They should not hang down onto the floor. Have a firm chair that has side arms. You can use this for support while you get dressed. Do not have throw rugs and other things on the floor that can make you trip. What can I do in the kitchen? Clean up any spills right away. Avoid walking on wet floors. Keep items that you use a lot in easy-to-reach places. If you need to reach something above you, use a strong step stool that has a grab bar. Keep electrical cords out of the way. Do not use floor polish or wax  that makes floors slippery. If you must use wax, use non-skid floor wax. Do not have throw rugs and other things on the floor that can make you trip. What can I do with my stairs? Do not leave any items on the stairs. Make sure that there are handrails on both sides of the stairs and use them. Fix handrails that are broken or loose. Make sure that handrails are as long as the stairways. Check any carpeting to make sure that it is firmly attached to the stairs. Fix any carpet that is loose or worn. Avoid having throw rugs at the top or bottom of the stairs. If you do have throw rugs, attach them to the floor with carpet tape. Make sure that you have a light switch at the top of the stairs and the bottom of the stairs. If you do not have them, ask someone to add them for you. What else can I do to help  prevent falls? Wear shoes that: Do not have high heels. Have rubber bottoms. Are comfortable and fit you well. Are closed at the toe. Do not wear sandals. If you use a stepladder: Make sure that it is fully opened. Do not climb a closed stepladder. Make sure that both sides of the stepladder are locked into place. Ask someone to hold it for you, if possible. Clearly mark and make sure that you can see: Any grab bars or handrails. First and last steps. Where the edge of each step is. Use tools that help you move around (mobility aids) if they are needed. These include: Canes. Walkers. Scooters. Crutches. Turn on the lights when you go into a dark area. Replace any light bulbs as soon as they burn out. Set up your furniture so you have a clear path. Avoid moving your furniture around. If any of your floors are uneven, fix them. If there are any pets around you, be aware of where they are. Review your medicines with your doctor. Some medicines can make you feel dizzy. This can increase your chance of falling. Ask your doctor what other things that you can do to help prevent falls. This information is not intended to replace advice given to you by your health care provider. Make sure you discuss any questions you have with your health care provider. Document Released: 02/28/2009 Document Revised: 10/10/2015 Document Reviewed: 06/08/2014 Elsevier Interactive Patient Education  2017 [employer].

## 2022-12-16 ENCOUNTER — Ambulatory Visit
Admission: RE | Admit: 2022-12-16 | Discharge: 2022-12-16 | Disposition: A | Discharge status: Home / Self Care | Payer: Medicare Other | Source: Ambulatory Visit | Attending: Family Medicine | Admitting: Family Medicine

## 2022-12-16 DIAGNOSIS — E2839 Other primary ovarian failure: Secondary | ICD-10-CM | POA: Insufficient documentation

## 2022-12-16 DIAGNOSIS — M85852 Other specified disorders of bone density and structure, left thigh: Secondary | ICD-10-CM | POA: Diagnosis not present

## 2022-12-24 ENCOUNTER — Ambulatory Visit: Payer: Medicare Other | Admitting: Pulmonary Disease

## 2022-12-25 DIAGNOSIS — J449 Chronic obstructive pulmonary disease, unspecified: Secondary | ICD-10-CM | POA: Diagnosis not present

## 2022-12-30 ENCOUNTER — Other Ambulatory Visit: Payer: Self-pay | Admitting: Family Medicine

## 2022-12-30 DIAGNOSIS — J301 Allergic rhinitis due to pollen: Secondary | ICD-10-CM

## 2023-01-05 ENCOUNTER — Encounter: Payer: Self-pay | Admitting: Pulmonary Disease

## 2023-01-05 DIAGNOSIS — J432 Centrilobular emphysema: Secondary | ICD-10-CM

## 2023-01-05 MED ORDER — ALBUTEROL SULFATE HFA 108 (90 BASE) MCG/ACT IN AERS
2.0000 | INHALATION_SPRAY | Freq: Four times a day (QID) | RESPIRATORY_TRACT | 2 refills | Status: DC | PRN
Start: 2023-01-05 — End: 2023-04-09

## 2023-01-06 ENCOUNTER — Ambulatory Visit: Payer: Medicare Other | Admitting: Gastroenterology

## 2023-01-07 ENCOUNTER — Encounter: Payer: Self-pay | Admitting: Pulmonary Disease

## 2023-01-07 NOTE — Telephone Encounter (Signed)
Given the severity of her symptoms I recommend either urgent care or ED.  Looks like she will need imaging.

## 2023-01-07 NOTE — Telephone Encounter (Signed)
The way she sounds looks like she may need to be admitted to the hospital.  I really think she needs to be seen in the ED.

## 2023-01-08 ENCOUNTER — Encounter: Payer: Self-pay | Admitting: Family Medicine

## 2023-01-08 ENCOUNTER — Telehealth: Payer: Medicare Other | Admitting: Family Medicine

## 2023-01-08 ENCOUNTER — Ambulatory Visit: Payer: Self-pay

## 2023-01-08 DIAGNOSIS — Z9981 Dependence on supplemental oxygen: Secondary | ICD-10-CM | POA: Diagnosis not present

## 2023-01-08 DIAGNOSIS — J441 Chronic obstructive pulmonary disease with (acute) exacerbation: Secondary | ICD-10-CM | POA: Diagnosis not present

## 2023-01-08 MED ORDER — AZITHROMYCIN 250 MG PO TABS
ORAL_TABLET | ORAL | 0 refills | Status: DC
Start: 2023-01-08 — End: 2023-01-20

## 2023-01-08 MED ORDER — PREDNISONE 10 MG PO TABS
ORAL_TABLET | ORAL | 0 refills | Status: DC
Start: 2023-01-08 — End: 2023-01-20

## 2023-01-08 NOTE — Telephone Encounter (Signed)
  Chief Complaint: SOB - requesting ABX and pred Symptoms: above Frequency: Ongoing - worsening Pertinent Negatives: Patient denies  Disposition: [] ED /[] Urgent Care (no appt availability in office) / [] Appointment(In office/virtual)/ []  [facility]/ [] Home Care/ [x] Refused Recommended Disposition /[] [facility]/ []  Follow-up with PCP Additional Notes: Pt called with SOB - worsening. Pt also communicated with Dr. Jayme Cloud office yesterday. Pt was requesting Pred and ABX from Dr. Jayme Cloud. Dr. Jayme Cloud advised ED for care. Pt refused. Today pt called PCP for ABX and pred. Pt does not want to go to ED. She feels she will be just fine with ABX and pred.  Please advise.  Reason for Disposition  [1] MODERATE difficulty breathing (e.g., speaks in phrases, SOB even at rest, pulse 100-120) AND [2] NEW-onset or WORSE than normal  Answer Assessment - Initial Assessment Questions 1. RESPIRATORY STATUS: "Describe your breathing?" (e.g., wheezing, shortness of breath, unable to speak, severe coughing)      SOB - COPD 2. ONSET: "When did this breathing problem begin?"      Ongoing  - worse now 4. SEVERITY: "How bad is your breathing?" (e.g., mild, moderate, severe)    - MILD: No SOB at rest, mild SOB with walking, speaks normally in sentences, can lie down, no retractions, pulse < 100.    - MODERATE: SOB at rest, SOB with minimal exertion and prefers to sit, cannot lie down flat, speaks in phrases, mild retractions, audible wheezing, pulse 100-120.    - SEVERE: Very SOB at rest, speaks in single words, struggling to breathe, sitting hunched forward, retractions, pulse > 120      Moderate 5. RECURRENT SYMPTOM: "Have you had difficulty breathing before?" If Yes, ask: "When was the last time?" and "What happened that time?"      yes 7. LUNG HISTORY: "Do you have any history of lung disease?"  (e.g., pulmonary embolus, asthma, emphysema)     COPD 8. CAUSE: "What do you think is causing  the breathing problem?"      COPD 9. OTHER SYMPTOMS: "Do you have any other symptoms? (e.g., dizziness, runny nose, cough, chest pain, fever)     Cough  Protocols used: Breathing Difficulty-A-AH

## 2023-01-08 NOTE — Assessment & Plan Note (Signed)
Advised patient to continue her regular inhalers and nebulizers.  Prescribed azithromycin and prednisone as noted below.  Advised her to follow-up as needed if she does not improve.  Encouraged her to obtain an incentive spirometer, with the help of her family, in order to expand her lungs and help to reduce development of pneumonia.

## 2023-01-08 NOTE — Patient Instructions (Signed)
For deep breathing exercises: Incentive spirometer (10 deep breaths IN per hour while awake)

## 2023-01-08 NOTE — Progress Notes (Signed)
MyChart Video Visit    Virtual Visit via Video Note   This format is felt to be most appropriate for this patient at this time. Physical exam was limited by quality of the video and audio technology used for the visit.   Patient location: Home Provider location: [facility]  I discussed the limitations of evaluation and management by telemedicine and the availability of in person appointments. The patient expressed understanding and agreed to proceed.  Patient: Erica Rivas   DOB: 12-Jun-1940   82 y.o. Female  MRN: [id number] Visit Date: 01/08/2023  Today's healthcare provider: Sherlyn Hay, DO   Chief Complaint  Patient presents with   Shortness of Breath   Subjective    Shortness of Breath Associated symptoms include wheezing (a little bit). Pertinent negatives include no abdominal pain, chest pain, fever or vomiting.    COPD Patient scheduled from triage call. She endorses shortness of breath for the past x2-3 weeks, particularly on exertion.  She notes that she is not short of breath while sitting still but only when she gets up and moves around. She is producing clear sputum with small amount of coughing but notes that she is more coughing than her usual   Medications: Outpatient Medications Prior to Visit  Medication Sig   albuterol (PROVENTIL) (2.5 MG/3ML) 0.083% nebulizer solution TAKE 3 MLS BY NEBULIZATION 4 TIMES DAILY AS NEEDED FOR WHEEZING OR SHORTNESS OF BREATH (AS NEEDEDUP TO 4 TIMES PER DAY)   albuterol (VENTOLIN HFA) 108 (90 Base) MCG/ACT inhaler Inhale 2 puffs into the lungs every 6 (six) hours as needed for wheezing or shortness of breath.   amLODipine (NORVASC) 5 MG tablet TAKE 1 TABLET BY MOUTH DAILY   aspirin EC 81 MG tablet Take 81 mg by mouth daily.   atenolol (TENORMIN) 50 MG tablet TAKE 1 TABLET BY MOUTH TWICE  DAILY   atorvastatin (LIPITOR) 80 MG tablet TAKE 1 TABLET BY MOUTH ONCE  DAILY   Budeson-Glycopyrrol-Formoterol  (BREZTRI AEROSPHERE) 160-9-4.8 MCG/ACT AERO Inhale 2 puffs into the lungs in the morning and at bedtime.   buPROPion (WELLBUTRIN XL) 300 MG 24 hr tablet Take 1 tablet (300 mg total) by mouth daily.   Cholecalciferol (VITAMIN D) 2000 UNITS tablet Take 1,000 Units by mouth daily.    cyanocobalamin (VITAMIN B12) 1000 MCG tablet Take 1 tablet (1,000 mcg total) by mouth daily.   furosemide (LASIX) 20 MG tablet Take 1 tablet (20 mg total) by mouth daily as needed (swelling).   glipiZIDE (GLUCOTROL XL) 10 MG 24 hr tablet TAKE 1 TABLET BY MOUTH DAILY   glucose blood (ONETOUCH ULTRA) test strip (Onetouch Ultra Blue) Use strip daily to check blood sugar for type 2 diabetes (E11.9)   hydrOXYzine (ATARAX) 25 MG tablet Take 12.5 mg by mouth at bedtime.   Iron-FA-B Cmp-C-Biot-Probiotic (FUSION PLUS) CAPS Take 1 capsule by mouth daily.   lipase/protease/amylase (CREON) 36000 UNITS CPEP capsule Take 2 capsules with the first bite of each meal and 1 capsule with the first bite of each snack   lisinopril-hydrochlorothiazide (ZESTORETIC) 20-25 MG tablet TAKE 1 TABLET BY MOUTH DAILY   metFORMIN (GLUCOPHAGE-XR) 500 MG 24 hr tablet Take 1 tablet (500 mg total) by mouth daily with breakfast.   montelukast (SINGULAIR) 10 MG tablet Take 1 tablet (10 mg total) by mouth daily.   omeprazole (PRILOSEC) 40 MG capsule TAKE 1 CAPSULE BY MOUTH DAILY   OneTouch Delica Lancets 33G MISC USE TO CHECK BLOOD SUGAR DAILY  OXYGEN Inhale 2 L into the lungs at bedtime. Use at night   valACYclovir (VALTREX) 1000 MG tablet 2 tablets twice a day for 1 day as needed for cold sores   No facility-administered medications prior to visit.    Review of Systems  Constitutional:  Negative for appetite change, chills, fatigue and fever.  HENT:  Positive for congestion (based on sputum production, per patient).   Respiratory:  Positive for shortness of breath and wheezing (a little bit). Negative for chest tightness.   Cardiovascular:  Negative  for chest pain and palpitations.  Gastrointestinal:  Negative for abdominal pain, nausea and vomiting.  Neurological:  Negative for dizziness and weakness.        Objective    There were no vitals taken for this visit. Patient provided: Pulse ox - 98% Pulse 78      Physical Exam Constitutional:      General: She is not in acute distress.    Appearance: Normal appearance.  HENT:     Head: Normocephalic.  Pulmonary:     Effort: Pulmonary effort is normal. No respiratory distress.  Neurological:     Mental Status: She is alert and oriented to person, place, and time. Mental status is at baseline.   Patient speaking in complete sentences without notable dyspnea during speech.       Assessment & Plan    Chronic obstructive pulmonary disease with acute exacerbation [facility]) Assessment & Plan: Advised patient to continue her regular inhalers and nebulizers.  Prescribed azithromycin and prednisone as noted below.  Advised her to follow-up as needed if she does not improve.  Encouraged her to obtain an incentive spirometer, with the help of her family, in order to expand her lungs and help to reduce development of pneumonia.  Orders: -     Azithromycin; Take 500mg  PO daily x1d and then 250mg  daily x4 days  Dispense: 6 each; Refill: 0 -     predniSONE; Day 1-2 take 6 pills. Day 3 take 5 pills then reduce by 1 pill each day.  Dispense: 27 tablet; Refill: 0  Dependence on nocturnal oxygen therapy    Return if symptoms worsen or fail to improve.     I discussed the assessment and treatment plan with the patient. The patient was provided an opportunity to ask questions and all were answered. The patient agreed with the plan and demonstrated an understanding of the instructions.   The patient was advised to call back or seek an in-person evaluation if the symptoms worsen or if the condition fails to improve as anticipated.  I provided 9 minutes of virtual-face-to-face time during  this encounter.   Sherlyn Hay, DO [facility] [phone] (phone) [phone] (fax)  [facility]

## 2023-01-08 NOTE — Telephone Encounter (Signed)
Patient scheduled.

## 2023-01-12 ENCOUNTER — Emergency Department: Payer: Medicare Other

## 2023-01-12 ENCOUNTER — Other Ambulatory Visit: Payer: Self-pay

## 2023-01-12 ENCOUNTER — Emergency Department
Admission: EM | Admit: 2023-01-12 | Discharge: 2023-01-12 | Disposition: A | Discharge status: Home / Self Care | Payer: Medicare Other | Attending: Emergency Medicine | Admitting: Emergency Medicine

## 2023-01-12 DIAGNOSIS — T782XXA Anaphylactic shock, unspecified, initial encounter: Secondary | ICD-10-CM | POA: Diagnosis not present

## 2023-01-12 DIAGNOSIS — S2241XD Multiple fractures of ribs, right side, subsequent encounter for fracture with routine healing: Secondary | ICD-10-CM | POA: Diagnosis not present

## 2023-01-12 DIAGNOSIS — S2241XA Multiple fractures of ribs, right side, initial encounter for closed fracture: Secondary | ICD-10-CM | POA: Diagnosis not present

## 2023-01-12 DIAGNOSIS — W01198A Fall on same level from slipping, tripping and stumbling with subsequent striking against other object, initial encounter: Secondary | ICD-10-CM | POA: Insufficient documentation

## 2023-01-12 DIAGNOSIS — J449 Chronic obstructive pulmonary disease, unspecified: Secondary | ICD-10-CM | POA: Diagnosis not present

## 2023-01-12 DIAGNOSIS — I700 Atherosclerosis of aorta: Secondary | ICD-10-CM | POA: Diagnosis not present

## 2023-01-12 DIAGNOSIS — T7840XA Allergy, unspecified, initial encounter: Secondary | ICD-10-CM | POA: Diagnosis not present

## 2023-01-12 DIAGNOSIS — R0602 Shortness of breath: Secondary | ICD-10-CM | POA: Diagnosis not present

## 2023-01-12 DIAGNOSIS — W19XXXA Unspecified fall, initial encounter: Secondary | ICD-10-CM

## 2023-01-12 DIAGNOSIS — R918 Other nonspecific abnormal finding of lung field: Secondary | ICD-10-CM | POA: Diagnosis not present

## 2023-01-12 DIAGNOSIS — S299XXA Unspecified injury of thorax, initial encounter: Secondary | ICD-10-CM | POA: Diagnosis present

## 2023-01-12 MED ORDER — CYCLOBENZAPRINE HCL 5 MG PO TABS: 5.0000 mg | ORAL_TABLET | Freq: Three times a day (TID) | ORAL | 0 refills | Status: AC | PRN

## 2023-01-12 MED ORDER — IPRATROPIUM-ALBUTEROL 0.5-2.5 (3) MG/3ML IN SOLN
6.0000 mL | Freq: Once | RESPIRATORY_TRACT | Status: AC
  Administered 2023-01-12: 6 mL via RESPIRATORY_TRACT
  Filled 2023-01-12: qty 6

## 2023-01-12 MED ORDER — MELOXICAM 15 MG PO TABS: 15.0000 mg | ORAL_TABLET | Freq: Every day | ORAL | 0 refills | Status: AC

## 2023-01-12 NOTE — ED Triage Notes (Signed)
Patient fell forwards into a cabinet 4 weeks ago; has had increased shortness of breath since fall. Patient was recently diagnosed with Bronchitis and started on Prednisone.

## 2023-01-12 NOTE — ED Notes (Signed)
Patient's daughter is at bedside and requesting to speak with the doctor to go over results; MD Vicente Males made aware.

## 2023-01-12 NOTE — ED Provider Notes (Signed)
[facility] Provider Note   Event Date/Time   First MD Initiated Contact with Patient 01/12/23 [phone]     (approximate) History  Fall  HPI Erica Rivas is a 82 y.o. female with a stated past medical history of COPD who presents 2 weeks after a fall complaining of right anterior chest wall pain.  Patient states that she lost her balance falling forward into a piece of furniture on the front of her chest including mostly on the right anterior portion.  Patient states that she has had bruising and pain since that time.  Patient does endorse blood thinner use as well which she states causes worsening bruising.  Patient denies any loss of consciousness or head trauma.  Patient does endorse recent diagnosis of COPD exacerbation and starting steroid treatment. ROS: Patient currently denies any vision changes, tinnitus, difficulty speaking, facial droop, sore throat, chest pain, shortness of breath, abdominal pain, nausea/vomiting/diarrhea, dysuria, or weakness/numbness/paresthesias in any extremity   Physical Exam  Triage Vital Signs: ED Triage Vitals  Encounter Vitals Group     BP --      Systolic BP Percentile --      Diastolic BP Percentile --      Pulse Rate 01/12/23 0831 70     Resp 01/12/23 0831 (!) 22     Temp --      Temp src --      SpO2 01/12/23 0830 99 %     Weight 01/12/23 0832 143 lb (64.9 kg)     Height 01/12/23 0832 5\' 6"  (1.676 m)     Head Circumference --      Peak Flow --      Pain Score 01/12/23 0831 0     Pain Loc --      Pain Education --      Exclude from Growth Chart --    Most recent vital signs: Vitals:   01/12/23 0900 01/12/23 1200  BP: 133/76 128/66  Pulse: 73 73  Resp: 16   Temp:    SpO2: 100% 94%   General: Awake, oriented x4. CV:  Good peripheral perfusion.  Resp:  Normal effort.  Abd:  No distention.  Other:  Elderly well-developed, well-nourished Caucasian female resting comfortably in no acute distress ED Results /  Procedures / Treatments  Labs (all labs ordered are listed, but only abnormal results are displayed) Labs Reviewed - No data to display RADIOLOGY ED MD interpretation: X-ray of the right chest and ribs shows subacute healing fractures of the right fourth and fifth rib with new heterogeneous area overlying the left midlung likely representing pneumonitis versus atelectasis/scarring -Agree with radiology assessment Official radiology report(s): DG Ribs Unilateral W/Chest Right  Result Date: 01/12/2023 CLINICAL DATA:  Fall 2 weeks back.  Right anterior chest wall pain. EXAM: RIGHT RIBS AND CHEST - 3+ VIEW COMPARISON:  07/26/2014. FINDINGS: There is new heterogeneous area overlying the left midlung zone which may represent focal pneumonitis versus atelectasis/scarring. Correlate clinically to determine the need for additional imaging. Follow-up examination in 4-6 weeks is recommended to document stability versus resolution. Bilateral lung fields are otherwise clear. Bilateral lateral costophrenic angles are clear. Normal cardio-mediastinal silhouette. Aortic arch calcifications noted. No acute osseous abnormalities. Subacute/healing fractures of lateral right fourth and fifth ribs noted. No acute displaced rib fracture seen. The soft tissues are within normal limits. There are surgical staples in the right axillary region. IMPRESSION: 1. Subacute/healing fractures of the right fourth and fifth ribs. No acute  displaced rib fracture seen. 2. New heterogeneous area overlying the left midlung zone which may represent focal pneumonitis versus atelectasis/scarring. Follow-up examination in 4-6 weeks is recommended to document stability versus resolution. Electronically Signed   By: Jules Schick M.D.   On: 01/12/2023 10:59   PROCEDURES: Critical Care performed: No .1-3 Lead EKG Interpretation  Performed by: Merwyn Katos, MD Authorized by: Merwyn Katos, MD     Interpretation: normal     ECG rate:  71    ECG rate assessment: normal     Rhythm: sinus rhythm     Ectopy: none     Conduction: normal    MEDICATIONS ORDERED IN ED: Medications  ipratropium-albuterol (DUONEB) 0.5-2.5 (3) MG/3ML nebulizer solution 6 mL (6 mLs Nebulization Given 01/12/23 0836)   IMPRESSION / MDM / ASSESSMENT AND PLAN / ED COURSE  I reviewed the triage vital signs and the nursing notes.                             The patient is on the cardiac monitor to evaluate for evidence of arrhythmia and/or significant heart rate changes. Patient's presentation is most consistent with acute presentation with potential threat to life or bodily function. This patient presents with atypical chest pain, most likely secondary to musculoskeletal injury. Differential diagnosis includes rib fracture, costochondritis, sternal fracture. Low suspicion for ACS, acute PE (PERC negative), pericarditis / myocarditis, thoracic aortic dissection, pneumothorax, pneumonia or other acute infectious process. Presentation not consistent with other acute, emergent causes of chest pain at this time. No indication for cardiac enzyme testing. Plan to order CXR to evaluate for acute cardiopulmonary causes.  Plan: EKG, CXR, pain control Chest x-ray showing subacute right fourth and fifth rib fracture.  Patient given meloxicam and Flexeril as needed.  Patient given incentive spirometer with instructions. Dispo: Discharge home with home care   FINAL CLINICAL IMPRESSION(S) / ED DIAGNOSES   Final diagnoses:  Fall, initial encounter  Closed fracture of multiple ribs of right side, initial encounter   Rx / DC Orders   ED Discharge Orders          Ordered    meloxicam (MOBIC) 15 MG tablet  Daily        01/12/23 1212    cyclobenzaprine (FLEXERIL) 5 MG tablet  3 times daily PRN        01/12/23 1212           Note:  This document was prepared using Dragon voice recognition software and may include unintentional dictation errors.   Merwyn Katos,  MD 01/12/23 [phone]

## 2023-01-19 NOTE — Telephone Encounter (Signed)
Can you work her in tomorrow afternoon?

## 2023-01-19 NOTE — Telephone Encounter (Signed)
We can see her tomorrow afternoon however it seems like she really needs admission to the hospital to handle all of these issues.

## 2023-01-19 NOTE — Telephone Encounter (Signed)
Appt has been scheduled. Nothing further needed.

## 2023-01-20 ENCOUNTER — Encounter: Payer: Self-pay | Admitting: Pulmonary Disease

## 2023-01-20 ENCOUNTER — Ambulatory Visit: Payer: Medicare Other | Admitting: Pulmonary Disease

## 2023-01-20 VITALS — BP 110/60 | HR 79 | Temp 97.5°F | Ht 66.0 in | Wt 143.0 lb

## 2023-01-20 DIAGNOSIS — J441 Chronic obstructive pulmonary disease with (acute) exacerbation: Secondary | ICD-10-CM

## 2023-01-20 DIAGNOSIS — S2249XS Multiple fractures of ribs, unspecified side, sequela: Secondary | ICD-10-CM

## 2023-01-20 DIAGNOSIS — J449 Chronic obstructive pulmonary disease, unspecified: Secondary | ICD-10-CM

## 2023-01-20 DIAGNOSIS — Z87891 Personal history of nicotine dependence: Secondary | ICD-10-CM | POA: Diagnosis not present

## 2023-01-20 MED ORDER — DOXYCYCLINE HYCLATE 100 MG PO TABS: 100.0000 mg | ORAL_TABLET | Freq: Two times a day (BID) | ORAL | 0 refills | Status: AC

## 2023-01-20 MED ORDER — METHYLPREDNISOLONE 4 MG PO TBPK: ORAL_TABLET | ORAL | 0 refills | Status: DC

## 2023-01-20 NOTE — Progress Notes (Signed)
Subjective:    Patient ID: Erica Rivas, female    DOB: 16-Feb-1941, 82 y.o.   MRN: [id number]  Patient Care Team: Malva Limes, MD as PCP - General (Family Medicine) Dasher, Cliffton Asters, MD as Consulting Physician (Dermatology) Sherrie George, MD as Consulting Physician (Ophthalmology) Harrel Carina, MD as Referring Physician (Surgery) Salena Saner, MD as Consulting Physician (Pulmonary Disease) Pa, [facility]  Chief Complaint  Patient presents with   Acute Visit    DOE. Wheezing. Cough with clear sputum. Symptoms started around 01/05/2023     HPI Patient is an 82 year old recent former smoker (quit 5 weeks ago) with a 60-pack-year history of smoking and a history as noted below who presents today for an acute visit for evaluation of shortness of breath and wheezing and cough productive of clear sputum which started around 05 January 2023.  The patient was last seen here on 18 March 2022.  And failed to follow-up after that.  She has stage III COPD and is maintained on Breztri and as needed albuterol.  In any event she sustained a fall approximately 5 weeks ago and developed chest pain after that.  It is confirmed that she had multiple rib fractures of the right fourth and fifth ribs and a new heterogeneous area overlying the left midlung likely representing pneumonitis versus scarring or atelectasis.  Since that fall the patient has noticed progressive shortness of breath due to her pain.  Most recently she has had increasing wheezing cough and sputum production which is clear to at times milky.  No fevers, chills or sweats.  She was evaluated via video visit on 23 August and received a prescription of azithromycin and prednisone.  She noticed after starting the Azithromycin that she felt like her throat was closing off and had to go to the emergency room.  She completed the prednisone without much relief of her symptoms.  She has not had any lower extremity edema.   Chest pain has become more bearable now.  She is however still bothered by her cough and wheezing.  She does not endorse any other symptomatology.    She has nocturnal hypoxemia and is compliant with oxygen at 2 L/min nocturnally.  She is compliant with Breztri and as needed albuterol as noted above.  The albuterol has not been as effective in helping her symptoms.  DATA/EVENTS 08/21/2020 LDCT lung cancer screening: Left lower lobe pulmonary nodule classified as lung RADS 4AS at that time because of a history of melanoma she had SBRT to this nodule at [facility].  03/10/2021 to 06/13/2021 chemotherapy: Underwent melanoma chemotherapy at [facility] 06/16/2021 PET/CT [facility]: Showed decrease in the size of that nodule without FDG uptake and no other FDG uptake was noted. 09/16/2021 PFTs: FEV1 1.08 L or 49% predicted, FVC 2.21 L or 76% predicted, FEV1/FVC 49%.  There is significant bronchodilator response.  Fusion capacity moderately reduced.  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  Asthma/COPD overlap. 09/22/2021 PET/CT [facility]: Previously seen left lower lobe nodule no longer visualized, no other sites of metastatic disease. 12/09/2021 overnight oximetry on room air: Met criteria for supplemental oxygen nocturnally.   02/23/2022 PET/CT [facility]: Left lower lobe peripheral 0.6 cm avid nodule and 2 left upper lobe peripheral mildly avid micronodules with avid left-sided mediastinal adenopathy new since prior, may represent sequela of resolving infectious/inflammatory process however, metastatic disease cannot be excluded.  Recommend follow-up CT 6 to 12  weeks.  Persistent uptake on the walls of the stomach proximal duodenum that could represent inflammatory process, advanced coronary disease.    Review of Systems A 10 point review of systems was performed and it is as noted above otherwise negative.   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) 09/30/2020   Malignant melanoma of right forearm (HCC) 09/11/2020   Carotid stenosis, right 11/13/2019   Carotid stenosis 10/05/2019   Emphysema lung (HCC) 09/25/2019   Moderate episode of recurrent major depressive disorder (HCC) 07/05/2018   Duodenitis 01/21/2018   Iron deficiency anemia 11/25/2017   Atherosclerosis of aorta (HCC) 07/23/2016   Coronary atherosclerosis 07/23/2016   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/2015   Allergic rhinitis 02/18/2015   Anxiety 02/18/2015   Aortic root dilation (HCC) 02/18/2015   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   Edema 02/18/2015   Insomnia 02/18/2015   Lung nodule 02/18/2015   Mass of pancreas 02/18/2015   Osteopenia 02/18/2015   Elevated WBC count 02/18/2015   Weight loss 02/18/2015   Diastolic dysfunction 02/18/2015   Vitamin B12 deficiency anemia 11/04/2014   Vitamin D deficiency 10/27/2013   Internal hemorrhoids without complication 09/16/2009   Smoking greater than 30 pack years 07/08/2007   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   Disc disorder of lumbar region 07/08/2007   Esophageal reflux 07/08/2007   Family history of breast cancer 07/08/2007   History of adenomatous polyp of colon 07/08/2007   History of other malignant neoplasm of skin 07/08/2007   Primary hypertension 07/08/2007   Pure hypercholesterolemia 07/08/2007    Social History   Tobacco Use   Smoking status: Every Day    Current packs/day: 2.00    Average packs/day: 2.0 packs/day for 60.0 years (120.0 ttl pk-yrs)    Types: Cigarettes   Smokeless tobacco: Never   Tobacco comments:    Quit 01/12/2023- khj 01/20/2023  Substance Use Topics   Alcohol use:  No    Alcohol/week: 0.0 standard drinks of alcohol    Allergies  Allergen Reactions   Azithromycin     Throat felt "tight" Lost her voice   Bupropion Other (See Comments)    fatigue    Current Meds  Medication Sig   albuterol (PROVENTIL) (2.5 MG/3ML) 0.083% nebulizer solution TAKE 3 MLS BY NEBULIZATION 4 TIMES DAILY AS NEEDED FOR WHEEZING OR SHORTNESS OF BREATH (AS NEEDEDUP TO 4 TIMES PER DAY)   albuterol (VENTOLIN HFA) 108 (90 Base) MCG/ACT inhaler Inhale 2 puffs into the lungs every 6 (six) hours as needed for wheezing or shortness of breath.   amLODipine (NORVASC) 5 MG tablet TAKE 1 TABLET BY MOUTH DAILY   aspirin EC 81 MG tablet Take 81 mg by mouth daily.   atenolol (TENORMIN) 50 MG tablet TAKE 1 TABLET BY MOUTH TWICE  DAILY   atorvastatin (LIPITOR) 80 MG tablet TAKE 1 TABLET BY MOUTH ONCE  DAILY   Budeson-Glycopyrrol-Formoterol (BREZTRI AEROSPHERE) 160-9-4.8 MCG/ACT AERO Inhale 2 puffs into the lungs in the morning and at bedtime.   buPROPion (WELLBUTRIN XL) 300 MG 24 hr tablet Take 1 tablet (300 mg total) by mouth daily.   Cholecalciferol (VITAMIN D) 2000 UNITS tablet Take 1,000 Units by mouth  daily.    cyanocobalamin (VITAMIN B12) 1000 MCG tablet Take 1 tablet (1,000 mcg total) by mouth daily.   furosemide (LASIX) 20 MG tablet Take 1 tablet (20 mg total) by mouth daily as needed (swelling).   glipiZIDE (GLUCOTROL XL) 10 MG 24 hr tablet TAKE 1 TABLET BY MOUTH DAILY   glucose blood (ONETOUCH ULTRA) test strip (Onetouch Ultra Blue) Use strip daily to check blood sugar for type 2 diabetes (E11.9)   hydrOXYzine (ATARAX) 25 MG tablet Take 12.5 mg by mouth at bedtime.   Iron-FA-B Cmp-C-Biot-Probiotic (FUSION PLUS) CAPS Take 1 capsule by mouth daily.   lipase/protease/amylase (CREON) 36000 UNITS CPEP capsule Take 2 capsules with the first bite of each meal and 1 capsule with the first bite of each snack   lisinopril-hydrochlorothiazide (ZESTORETIC) 20-25 MG tablet TAKE 1 TABLET BY MOUTH  DAILY   meloxicam (MOBIC) 15 MG tablet Take 1 tablet (15 mg total) by mouth daily for 14 days.   metFORMIN (GLUCOPHAGE-XR) 500 MG 24 hr tablet Take 1 tablet (500 mg total) by mouth daily with breakfast.   montelukast (SINGULAIR) 10 MG tablet Take 1 tablet (10 mg total) by mouth daily.   omeprazole (PRILOSEC) 40 MG capsule TAKE 1 CAPSULE BY MOUTH DAILY   OneTouch Delica Lancets 33G MISC USE TO CHECK BLOOD SUGAR DAILY   OXYGEN Inhale 2 L into the lungs at bedtime. Use at night   predniSONE (DELTASONE) 10 MG tablet Day 1-2 take 6 pills. Day 3 take 5 pills then reduce by 1 pill each day.   valACYclovir (VALTREX) 1000 MG tablet 2 tablets twice a day for 1 day as needed for cold sores    Immunization History  Administered Date(s) Administered   Covid-19, Mrna,Vaccine(Spikevax)34yrs and older 03/23/2022   Fluad Quad(high Dose 65+) 01/25/2019, 01/26/2020, 02/13/2021, 03/23/2022   Influenza, High Dose Seasonal PF 02/18/2015, 03/02/2016, 01/19/2017, 03/12/2018   Moderna SARS-COV2 Booster Vaccination 09/06/2019, 03/15/2020   Moderna Sars-Covid-2 Vaccination 05/31/2019, 06/28/2019   Pfizer Covid-19 Vaccine Bivalent Booster 70yrs & up 02/13/2021   Pneumococcal Conjugate-13 10/24/2013   Pneumococcal Polysaccharide-23 04/01/2005, 05/18/2005, 04/26/2012   RSV,unspecified 03/23/2022   Tdap 01/01/2012   Zoster, Live 01/01/2012, 06/26/2021, 09/10/2021        Objective:     BP 110/60 (BP Location: Right Arm, Cuff Size: Normal)   Pulse 79   Temp (!) 97.5 F (36.4 C)   Ht 5\' 6"  (1.676 m)   Wt 143 lb (64.9 kg)   SpO2 97%   BMI 23.08 kg/m   SpO2: 97 % O2 Device: None (Room air)  GENERAL: Well-developed, well-nourished elderly woman, presents in transport chair, no conversational dyspnea.  HEAD: Normocephalic, atraumatic.  EYES: Pupils equal, round, reactive to light.  No scleral icterus.  MOUTH: Edentulous, oral mucosa moist, no thrush. NECK: Supple. No thyromegaly. Trachea midline. No JVD.   No adenopathy. PULMONARY: Good air entry bilaterally.  Diffuse wheezing throughout. ABDOMEN: Benign. MUSCULOSKELETAL: No joint deformity, no clubbing, no edema.  NEUROLOGIC: No overt focal deficit SKIN: Intact,warm,dry.  Areas of prior melanoma excision in right forearm, well-healed. PSYCH: Anxious mood, normal behavior.   Chest x-ray obtained on 12 January 2023 showing the area of possible pneumonitis versus atelectasis versus scarring on the left:   Assessment & Plan:     ICD-10-CM   1. COPD with acute exacerbation (HCC)  J44.1 DG Chest 2 View   Will treat with Medrol Dosepak Doxycycline 100 mg twice daily x 7 days Continue bronchodilators    2. Stage  3 severe COPD by GOLD classification (HCC)  J44.9    Continue Breztri 2 puffs twice a day Continue albuterol via nebulization 3 times to 4 times a day    3. Closed fracture of multiple ribs, unspecified laterality, sequela  S22.49XS DG Chest 2 View   Right rib fractures fourth and fifth, healing    4. Former smoker  Z87.891    Quit 5 weeks ago No evidence of relapse In early remission     Orders Placed This Encounter  Procedures   DG Chest 2 View    Standing Status:   Future    Standing Expiration Date:   01/20/2024    Order Specific Question:   Reason for Exam (SYMPTOM  OR DIAGNOSIS REQUIRED)    Answer:   Pneumonia, COPD exacerbation    Order Specific Question:   Preferred imaging location?    Answer:   [facility]   Meds ordered this encounter  Medications   methylPREDNISolone (MEDROL DOSEPAK) 4 MG TBPK tablet    Sig: Take as directed in the packet.    Dispense:  21 tablet    Refill:  0   doxycycline (VIBRA-TABS) 100 MG tablet    Sig: Take 1 tablet (100 mg total) by mouth 2 (two) times daily for 7 days.    Dispense:  14 tablet    Refill:  0   Patient has prolonged COPD exacerbation.  We will treat with the above.  We will see her in follow-up on 19 September as previously scheduled with a chest x-ray at that  time to ensure resolution of the previously seen issues on chest x-ray.  She is to contact us prior to that time should any new difficulties arise.   Gailen Shelter, MD Advanced Bronchoscopy PCCM [facility]    *This note was dictated using voice recognition software/Dragon.  Despite best efforts to proofread, errors can occur which can change the meaning. Any transcriptional errors that result from this process are unintentional and may not be fully corrected at the time of dictation.

## 2023-01-20 NOTE — Patient Instructions (Signed)
We have sent in prescriptions to your pharmacy for a Medrol Dosepak and an antibiotic.  Take them as directed.  Be careful with exposure in the sun with the antibiotic as it can cause a sunburn.  You will need chest x-ray on follow-up.  Keep your appointment for 19 September.  Call sooner should your symptoms worsen in the interim.

## 2023-01-26 ENCOUNTER — Ambulatory Visit (INDEPENDENT_AMBULATORY_CARE_PROVIDER_SITE_OTHER): Payer: Medicare Other | Admitting: Family Medicine

## 2023-01-26 VITALS — BP 131/65 | HR 75 | Temp 97.7°F | Ht 66.0 in | Wt 145.0 lb

## 2023-01-26 DIAGNOSIS — E559 Vitamin D deficiency, unspecified: Secondary | ICD-10-CM | POA: Diagnosis not present

## 2023-01-26 DIAGNOSIS — R609 Edema, unspecified: Secondary | ICD-10-CM | POA: Diagnosis not present

## 2023-01-26 DIAGNOSIS — Z7984 Long term (current) use of oral hypoglycemic drugs: Secondary | ICD-10-CM

## 2023-01-26 DIAGNOSIS — N1832 Chronic kidney disease, stage 3b: Secondary | ICD-10-CM

## 2023-01-26 DIAGNOSIS — D519 Vitamin B12 deficiency anemia, unspecified: Secondary | ICD-10-CM | POA: Diagnosis not present

## 2023-01-26 DIAGNOSIS — E1140 Type 2 diabetes mellitus with diabetic neuropathy, unspecified: Secondary | ICD-10-CM | POA: Diagnosis not present

## 2023-01-26 MED ORDER — LANCETS MISC. MISC
1.0000 | Freq: Three times a day (TID) | 3 refills | Status: AC
Start: 2023-01-26 — End: 2023-02-25

## 2023-01-26 MED ORDER — BLOOD GLUCOSE TEST VI STRP
1.0000 | ORAL_STRIP | Freq: Three times a day (TID) | 3 refills | Status: DC
Start: 2023-01-26 — End: 2023-04-09

## 2023-01-26 MED ORDER — BLOOD GLUCOSE MONITORING SUPPL DEVI
1.0000 | Freq: Three times a day (TID) | 0 refills | Status: DC
Start: 2023-01-26 — End: 2023-02-19

## 2023-01-28 ENCOUNTER — Telehealth: Payer: Self-pay

## 2023-01-28 DIAGNOSIS — R7303 Prediabetes: Secondary | ICD-10-CM | POA: Diagnosis not present

## 2023-01-28 DIAGNOSIS — E559 Vitamin D deficiency, unspecified: Secondary | ICD-10-CM | POA: Diagnosis not present

## 2023-01-28 DIAGNOSIS — R739 Hyperglycemia, unspecified: Secondary | ICD-10-CM | POA: Diagnosis not present

## 2023-01-28 DIAGNOSIS — N1832 Chronic kidney disease, stage 3b: Secondary | ICD-10-CM | POA: Diagnosis not present

## 2023-01-28 DIAGNOSIS — D519 Vitamin B12 deficiency anemia, unspecified: Secondary | ICD-10-CM | POA: Diagnosis not present

## 2023-01-28 NOTE — Telephone Encounter (Signed)
Transition Care Management Unsuccessful Follow-up Telephone Call  Date of discharge and from where:  [place] 8/27  Attempts:  1st Attempt  Reason for unsuccessful TCM follow-up call:  No answer/busy   Lenard Forth [place]  [facility], [facility] Guide, Phone: [phone] Website: [email or web address]

## 2023-01-29 ENCOUNTER — Telehealth: Payer: Self-pay

## 2023-01-29 LAB — RENAL FUNCTION PANEL
Albumin: 3.3 g/dL — ABNORMAL LOW (ref 3.8–4.8)
BUN/Creatinine Ratio: 32 — ABNORMAL HIGH (ref 12–28)
BUN: 39 mg/dL — ABNORMAL HIGH (ref 8–27)
CO2: 22 mmol/L (ref 20–29)
Calcium: 9.3 mg/dL (ref 8.7–10.3)
Chloride: 104 mmol/L (ref 96–106)
Creatinine, Ser: 1.22 mg/dL — ABNORMAL HIGH (ref 0.57–1.00)
Glucose: 150 mg/dL — ABNORMAL HIGH (ref 70–99)
Phosphorus: 3 mg/dL (ref 3.0–4.3)
Potassium: 5.4 mmol/L — ABNORMAL HIGH (ref 3.5–5.2)
Sodium: 139 mmol/L (ref 134–144)
eGFR: 45 mL/min/{1.73_m2} — ABNORMAL LOW (ref >59)

## 2023-01-29 LAB — VITAMIN D 25 HYDROXY (VIT D DEFICIENCY, FRACTURES): Vit D, 25-Hydroxy: 33 ng/mL (ref 30.0–100.0)

## 2023-01-29 LAB — VITAMIN B12: Vitamin B-12: >2000 pg/mL — ABNORMAL HIGH (ref 232–1245)

## 2023-01-29 LAB — CBC
Hematocrit: 29.5 % — ABNORMAL LOW (ref 34.0–46.6)
Hemoglobin: 9.2 g/dL — ABNORMAL LOW (ref 11.1–15.9)
MCH: 26.7 pg (ref 26.6–33.0)
MCHC: 31.2 g/dL — ABNORMAL LOW (ref 31.5–35.7)
MCV: 86 fL (ref 79–97)
Platelets: 275 10*3/uL (ref 150–450)
RBC: 3.45 x10E6/uL — ABNORMAL LOW (ref 3.77–5.28)
RDW: 14.6 % (ref 11.7–15.4)
WBC: 13.1 10*3/uL — ABNORMAL HIGH (ref 3.4–10.8)

## 2023-01-29 NOTE — Telephone Encounter (Signed)
Transition Care Management Unsuccessful Follow-up Telephone Call  Date of discharge and from where:  [place] 8/27  Attempts:  2nd Attempt  Reason for unsuccessful TCM follow-up call:  No answer/busy   Erica Rivas [place]  [facility], [facility] Guide, Phone: [phone] Website: [email or web address]

## 2023-02-01 NOTE — Patient Instructions (Signed)
.   Please review the attached list of medications and notify my office if there are any errors.   . Please bring all of your medications to every appointment so we can make sure that our medication list is the same as yours.   

## 2023-02-01 NOTE — Progress Notes (Unsigned)
Established patient visit   Patient: Erica Rivas   DOB: Apr 10, 1941   82 y.o. Female  MRN: [id number] Visit Date: 01/26/2023  Today's healthcare provider: [name], MD   Chief Complaint  Patient presents with   Diabetes   Subjective    Discussed the use of AI scribe software for clinical note transcription with the patient, who gave verbal consent to proceed.  History of Present Illness   The patient, with a history of diabetes, COPD, and recent rib injury, presents for routine follow up of diabetes and with complaints of elevated blood sugar levels and difficulty breathing. She reports a fall four weeks ago, which she initially believed resulted in cracked ribs. However, she was able to cough, sneeze, and lay in any position without significant discomfort, leading her to question the initial assumption. She sought emergency care three weeks post-fall, where it was determined that the ribs were healing well.  The patient's primary concern is her blood sugar levels, which have consistently measured around 300. She attributes this to a recent reduction in her metformin dosage from 500mg . She denies any episodes of hypoglycemia since the dosage adjustment.  Her breathing has been more labored since the fall, but she reports gradual improvement. She is currently on a course of prednisone and antibiotics, which she reports has led to some improvement.  The patient also reports occasional dizziness, particularly when reaching for high shelves, which she describes as a brief blackout. She denies frequent episodes of this nature. She also reports some swelling, which she manages by elevating her feet when possible.       Medications: Outpatient Medications Prior to Visit  Medication Sig   albuterol (PROVENTIL) (2.5 MG/3ML) 0.083% nebulizer solution TAKE 3 MLS BY NEBULIZATION 4 TIMES DAILY AS NEEDED FOR WHEEZING OR SHORTNESS OF BREATH (AS NEEDEDUP TO 4 TIMES PER DAY)   albuterol  (VENTOLIN HFA) 108 (90 Base) MCG/ACT inhaler Inhale 2 puffs into the lungs every 6 (six) hours as needed for wheezing or shortness of breath.   amLODipine (NORVASC) 5 MG tablet TAKE 1 TABLET BY MOUTH DAILY   aspirin EC 81 MG tablet Take 81 mg by mouth daily.   atenolol (TENORMIN) 50 MG tablet TAKE 1 TABLET BY MOUTH TWICE  DAILY   atorvastatin (LIPITOR) 80 MG tablet TAKE 1 TABLET BY MOUTH ONCE  DAILY   Budeson-Glycopyrrol-Formoterol (BREZTRI AEROSPHERE) 160-9-4.8 MCG/ACT AERO Inhale 2 puffs into the lungs in the morning and at bedtime.   buPROPion (WELLBUTRIN XL) 300 MG 24 hr tablet Take 1 tablet (300 mg total) by mouth daily.   Cholecalciferol (VITAMIN D) 2000 UNITS tablet Take 1,000 Units by mouth daily.    cyanocobalamin (VITAMIN B12) 1000 MCG tablet Take 1 tablet (1,000 mcg total) by mouth daily.   [EXPIRED] doxycycline (VIBRA-TABS) 100 MG tablet Take 1 tablet (100 mg total) by mouth 2 (two) times daily for 7 days.   furosemide (LASIX) 20 MG tablet Take 1 tablet (20 mg total) by mouth daily as needed (swelling).   glipiZIDE (GLUCOTROL XL) 10 MG 24 hr tablet TAKE 1 TABLET BY MOUTH DAILY   glucose blood (ONETOUCH ULTRA) test strip (Onetouch Ultra Blue) Use strip daily to check blood sugar for type 2 diabetes (E11.9)   hydrOXYzine (ATARAX) 25 MG tablet Take 12.5 mg by mouth at bedtime.   Iron-FA-B Cmp-C-Biot-Probiotic (FUSION PLUS) CAPS Take 1 capsule by mouth daily.   lipase/protease/amylase (CREON) 36000 UNITS CPEP capsule Take 2 capsules with the  first bite of each meal and 1 capsule with the first bite of each snack   lisinopril-hydrochlorothiazide (ZESTORETIC) 20-25 MG tablet TAKE 1 TABLET BY MOUTH DAILY   [EXPIRED] meloxicam (MOBIC) 15 MG tablet Take 1 tablet (15 mg total) by mouth daily for 14 days.   metFORMIN (GLUCOPHAGE-XR) 500 MG 24 hr tablet Take 1 tablet (500 mg total) by mouth daily with breakfast.   methylPREDNISolone (MEDROL DOSEPAK) 4 MG TBPK tablet Take as directed in the packet.    montelukast (SINGULAIR) 10 MG tablet Take 1 tablet (10 mg total) by mouth daily.   omeprazole (PRILOSEC) 40 MG capsule TAKE 1 CAPSULE BY MOUTH DAILY   OneTouch Delica Lancets 33G MISC USE TO CHECK BLOOD SUGAR DAILY   OXYGEN Inhale 2 L into the lungs at bedtime. Use at night   valACYclovir (VALTREX) 1000 MG tablet 2 tablets twice a day for 1 day as needed for cold sores   No facility-administered medications prior to visit.   {Insert previous labs (optional):23779} {See past labs  Heme  Chem  Endocrine  Serology  Results Review (optional):1}   Objective    BP 131/65 (BP Location: Left Arm, Patient Position: Sitting, Cuff Size: Normal)   Pulse 75   Temp 97.7 F (36.5 C) (Oral)   Ht 5\' 6"  (1.676 m)   Wt 145 lb (65.8 kg)   SpO2 100%   BMI 23.40 kg/m {Insert last BP/Wt (optional):23777}{See vitals history (optional):1}   Physical Exam   VITALS: BP- 131/? CHEST: Wheezing upon auscultation. CARDIOVASCULAR: Heart sounds normal upon auscultation. EXTREMITIES: Edema noted.       Assessment & Plan        Rib Injury Recent fall with rib pain, but no evidence of fracture on imaging. Symptoms improving. -Continue conservative management.  Diabetes Mellitus Recent blood sugars consistently around 300. A1c elevated at 8.6. Patient reports no hypoglycemic episodes since reduction in Metformin dose. -HgbA1c -Check renal function before adjusting diabetic medications due to elevated blood sugars. -Consider increasing Metformin dose depending on renal function results.  COPD Recent exacerbation with possible pneumonia. Currently on course of Prednisone and antibiotics. -Continue current treatment course.  Edema Mild lower extremity swelling noted. -Advise patient to elevate legs when possible.  Dizziness Reports of dizziness, particularly when reaching upwards or turning head. No evidence of carotid artery disease on recent imaging. -Consider positional vertigo.  General  Health Maintenance -Order labs to check renal function.    No follow-ups on file.      [name], MD  [facility] [phone] (phone) [phone] (fax)  [facility]

## 2023-02-03 LAB — SPECIMEN STATUS REPORT

## 2023-02-03 LAB — HGB A1C W/O EAG: Hgb A1c MFr Bld: 8.8 % — ABNORMAL HIGH (ref 4.8–5.6)

## 2023-02-04 ENCOUNTER — Ambulatory Visit
Admission: RE | Admit: 2023-02-04 | Discharge: 2023-02-04 | Disposition: A | Discharge status: Home / Self Care | Payer: Medicare Other | Attending: Pulmonary Disease | Admitting: Pulmonary Disease

## 2023-02-04 ENCOUNTER — Ambulatory Visit: Payer: Medicare Other | Admitting: Pulmonary Disease

## 2023-02-04 ENCOUNTER — Encounter: Payer: Self-pay | Admitting: Pulmonary Disease

## 2023-02-04 ENCOUNTER — Ambulatory Visit
Admission: RE | Admit: 2023-02-04 | Discharge: 2023-02-04 | Disposition: A | Discharge status: Home / Self Care | Payer: Medicare Other | Source: Ambulatory Visit | Attending: Pulmonary Disease | Admitting: Pulmonary Disease

## 2023-02-04 VITALS — BP 122/70 | HR 76 | Temp 98.0°F | Ht 66.0 in | Wt 145.0 lb

## 2023-02-04 DIAGNOSIS — J90 Pleural effusion, not elsewhere classified: Secondary | ICD-10-CM

## 2023-02-04 DIAGNOSIS — S2249XS Multiple fractures of ribs, unspecified side, sequela: Secondary | ICD-10-CM | POA: Insufficient documentation

## 2023-02-04 DIAGNOSIS — J189 Pneumonia, unspecified organism: Secondary | ICD-10-CM

## 2023-02-04 DIAGNOSIS — J449 Chronic obstructive pulmonary disease, unspecified: Secondary | ICD-10-CM | POA: Diagnosis not present

## 2023-02-04 DIAGNOSIS — Z85820 Personal history of malignant melanoma of skin: Secondary | ICD-10-CM | POA: Diagnosis not present

## 2023-02-04 DIAGNOSIS — J441 Chronic obstructive pulmonary disease with (acute) exacerbation: Secondary | ICD-10-CM | POA: Insufficient documentation

## 2023-02-04 DIAGNOSIS — Z87891 Personal history of nicotine dependence: Secondary | ICD-10-CM

## 2023-02-04 DIAGNOSIS — R918 Other nonspecific abnormal finding of lung field: Secondary | ICD-10-CM | POA: Diagnosis not present

## 2023-02-04 DIAGNOSIS — J439 Emphysema, unspecified: Secondary | ICD-10-CM

## 2023-02-04 DIAGNOSIS — G4736 Sleep related hypoventilation in conditions classified elsewhere: Secondary | ICD-10-CM | POA: Diagnosis not present

## 2023-02-04 MED ORDER — PREDNISONE 20 MG PO TABS: 20.0000 mg | ORAL_TABLET | Freq: Every day | ORAL | 0 refills | Status: AC

## 2023-02-04 MED ORDER — AMOXICILLIN-POT CLAVULANATE 875-125 MG PO TABS: 1.0000 | ORAL_TABLET | Freq: Two times a day (BID) | ORAL | 0 refills | Status: DC

## 2023-02-04 MED ORDER — IPRATROPIUM-ALBUTEROL 0.5-2.5 (3) MG/3ML IN SOLN: 3.0000 mL | Freq: Four times a day (QID) | RESPIRATORY_TRACT | 11 refills | Status: DC

## 2023-02-04 NOTE — Patient Instructions (Signed)
We are working on getting the fluid in your lung drain tomorrow and follow-up with a CT of the chest.  I have changed your medication for your COPD it is going to be DuoNeb (ipratropium/albuterol) this is 4 times a day.  The prescription was sent to your pharmacy.  I have sent prescriptions for some more antibiotic and a few more tablets of prednisone.  Follow the directions on the bottles.  We will see her in follow-up in 2 weeks time or the nurse practitioner at that time.  We will get a chest x-ray upon follow-up.

## 2023-02-04 NOTE — Progress Notes (Signed)
Subjective:    Patient ID: Erica Rivas, female    DOB: 1940-12-20, 82 y.o.   MRN: [id number]  Patient Care Team: Malva Limes, MD as PCP - General (Family Medicine) Dasher, Cliffton Asters, MD as Consulting Physician (Dermatology) Sherrie George, MD as Consulting Physician (Ophthalmology) Harrel Carina, MD as Referring Physician (Surgery) Salena Saner, MD as Consulting Physician (Pulmonary Disease) [facility], [facility]  Chief Complaint  Patient presents with   Follow-up    Swelling in feet. Hoarseness. DOE. Wheezing at times. Cough with clear sputum.    HPI Erica Rivas is an 82 year old recent former smoker (quit 7 weeks ago) with a 60-pack-year history of smoking and a history as noted below who presents today for follow-up of shortness of breath and wheezing and cough productive of clear sputum which started around 05 January 2023.  We evaluated her as an acute visit on 4 September.  This appointment was a scheduled follow-up visit.  At her 4 September appointment she was treated for acute exacerbation of COPD, she received Medrol Dosepak and doxycycline to treat.  She returns today with a chest x-ray as follow-up.  Recall that she has stage III COPD and is maintained on Breztri and as needed albuterol.  She sustained a fall approximately 7 weeks ago and developed chest pain after that.  It is confirmed that she had multiple rib fractures of the right fourth and fifth ribs and a new heterogeneous area overlying the left midlung likely representing pneumonitis versus scarring or atelectasis.  Since her treatment on 4 September she has felt somewhat better but she continues to have issues with hoarseness.  She also continues to have cough that is productive now of yellowish sputum.  No hemoptysis.  She does not endorse any fevers, chills or sweats.  She continues to have shortness of breath.  The previously noted chest pain due to her rib fractures has improved significantly.  She  has nocturnal hypoxemia and is compliant with oxygen at 2 L/min nocturnally.  She is compliant with Breztri and as needed albuterol as noted above.  The albuterol has not been as effective in helping her symptoms.  She also notes that she does not feel she is getting relief from [place] as she used to.  He does not feel that she can get the medication in deep enough into her lungs.  DATA/EVENTS 08/21/2020 LDCT lung cancer screening: Left lower lobe pulmonary nodule classified as lung RADS 4AS at that time because of a history of melanoma she had SBRT to this nodule at [facility].  03/10/2021 to 06/13/2021 chemotherapy: Underwent melanoma chemotherapy at [facility] 06/16/2021 PET/CT [facility]: Showed decrease in the size of that nodule without FDG uptake and no other FDG uptake was noted. 09/16/2021 PFTs: FEV1 1.08 L or 49% predicted, FVC 2.21 L or 76% predicted, FEV1/FVC 49%.  There is significant bronchodilator response.  Fusion capacity moderately reduced.  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  Asthma/COPD overlap. 09/22/2021 PET/CT [facility]: Previously seen left lower lobe nodule no longer visualized, no other sites of metastatic disease. 12/09/2021 overnight oximetry on room air: Met criteria for supplemental oxygen nocturnally.   02/23/2022 PET/CT [facility]: Left lower lobe peripheral 0.6 cm avid nodule and 2 left upper lobe peripheral mildly avid micronodules with avid left-sided mediastinal adenopathy new since prior, may represent sequela of resolving infectious/inflammatory process however, metastatic disease cannot be excluded.  Recommend follow-up CT 6 to  12 weeks.  Persistent uptake on the walls of the stomach proximal duodenum that could represent inflammatory process, advanced coronary disease.  Review of Systems A 10 point review of systems was performed and it is as noted above otherwise negative.   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) 09/30/2020   Malignant melanoma of right forearm (HCC) 09/11/2020   Carotid stenosis, right 11/13/2019   Carotid stenosis 10/05/2019   Emphysema lung (HCC) 09/25/2019   Moderate episode of recurrent major depressive disorder (HCC) 07/05/2018   Duodenitis 01/21/2018   Iron deficiency anemia 11/25/2017   Atherosclerosis of aorta (HCC) 07/23/2016   Coronary atherosclerosis 07/23/2016   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/2015   Allergic rhinitis 02/18/2015   Anxiety 02/18/2015   Aortic root dilation (HCC) 02/18/2015   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   Edema 02/18/2015   Insomnia 02/18/2015   Lung nodule 02/18/2015   Mass of pancreas 02/18/2015   Osteopenia 02/18/2015   Elevated WBC count 02/18/2015   Weight loss 02/18/2015   Diastolic dysfunction 02/18/2015   Vitamin B12 deficiency anemia 11/04/2014   Vitamin D deficiency 10/27/2013   Internal hemorrhoids without complication 09/16/2009   Smoking greater than 30 pack years 07/08/2007   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   Disc disorder of lumbar region 07/08/2007   Esophageal reflux 07/08/2007   Family history of breast cancer 07/08/2007   History of adenomatous polyp of colon 07/08/2007   History of other malignant neoplasm of skin 07/08/2007   Primary hypertension 07/08/2007   Pure hypercholesterolemia 07/08/2007    Social History   Tobacco Use   Smoking status: Former    Current packs/day: 2.00    Average packs/day: 2.0 packs/day for 60.0 years (120.0 ttl pk-yrs)    Types: Cigarettes   Smokeless tobacco: Never   Tobacco comments:    Quit 01/12/2023- khj 02/04/2023  Substance Use Topics    Alcohol use: No    Alcohol/week: 0.0 standard drinks of alcohol    Allergies  Allergen Reactions   Azithromycin     Throat felt "tight" Lost her voice   Bupropion Other (See Comments)    fatigue    Current Meds  Medication Sig   albuterol (PROVENTIL) (2.5 MG/3ML) 0.083% nebulizer solution TAKE 3 MLS BY NEBULIZATION 4 TIMES DAILY AS NEEDED FOR WHEEZING OR SHORTNESS OF BREATH (AS NEEDEDUP TO 4 TIMES PER DAY)   albuterol (VENTOLIN HFA) 108 (90 Base) MCG/ACT inhaler Inhale 2 puffs into the lungs every 6 (six) hours as needed for wheezing or shortness of breath.   amLODipine (NORVASC) 5 MG tablet TAKE 1 TABLET BY MOUTH DAILY   aspirin EC 81 MG tablet Take 81 mg by mouth daily.   atenolol (TENORMIN) 50 MG tablet TAKE 1 TABLET BY MOUTH TWICE  DAILY   atorvastatin (LIPITOR) 80 MG tablet TAKE 1 TABLET BY MOUTH ONCE  DAILY   Blood Glucose Monitoring Suppl DEVI 1 each by Does not apply route in the morning, at noon, and at bedtime. May substitute to any manufacturer covered by patient's insurance.   Budeson-Glycopyrrol-Formoterol (BREZTRI AEROSPHERE) 160-9-4.8 MCG/ACT AERO Inhale 2 puffs into the lungs in the morning and at bedtime.   buPROPion (  WELLBUTRIN XL) 300 MG 24 hr tablet Take 1 tablet (300 mg total) by mouth daily.   Cholecalciferol (VITAMIN D) 2000 UNITS tablet Take 1,000 Units by mouth daily.    cyanocobalamin (VITAMIN B12) 1000 MCG tablet Take 1 tablet (1,000 mcg total) by mouth daily.   furosemide (LASIX) 20 MG tablet Take 1 tablet (20 mg total) by mouth daily as needed (swelling).   glipiZIDE (GLUCOTROL XL) 10 MG 24 hr tablet TAKE 1 TABLET BY MOUTH DAILY   Glucose Blood (BLOOD GLUCOSE TEST STRIPS) STRP 1 each by In Vitro route in the morning, at noon, and at bedtime. May substitute to any manufacturer covered by patient's insurance.   glucose blood (ONETOUCH ULTRA) test strip (Onetouch Ultra Blue) Use strip daily to check blood sugar for type 2 diabetes (E11.9)   hydrOXYzine (ATARAX)  25 MG tablet Take 12.5 mg by mouth at bedtime.   Iron-FA-B Cmp-C-Biot-Probiotic (FUSION PLUS) CAPS Take 1 capsule by mouth daily.   Lancets Misc. MISC 1 each by Does not apply route in the morning, at noon, and at bedtime. May substitute to any manufacturer covered by patient's insurance.   lipase/protease/amylase (CREON) 36000 UNITS CPEP capsule Take 2 capsules with the first bite of each meal and 1 capsule with the first bite of each snack   lisinopril-hydrochlorothiazide (ZESTORETIC) 20-25 MG tablet TAKE 1 TABLET BY MOUTH DAILY   metFORMIN (GLUCOPHAGE-XR) 500 MG 24 hr tablet Take 1 tablet (500 mg total) by mouth daily with breakfast.   montelukast (SINGULAIR) 10 MG tablet Take 1 tablet (10 mg total) by mouth daily.   omeprazole (PRILOSEC) 40 MG capsule TAKE 1 CAPSULE BY MOUTH DAILY   OneTouch Delica Lancets 33G MISC USE TO CHECK BLOOD SUGAR DAILY   OXYGEN Inhale 2 L into the lungs at bedtime. Use at night   valACYclovir (VALTREX) 1000 MG tablet 2 tablets twice a day for 1 day as needed for cold sores    Immunization History  Administered Date(s) Administered   Covid-19, Mrna,Vaccine(Spikevax)8yrs and older 03/23/2022   Fluad Quad(high Dose 65+) 01/25/2019, 01/26/2020, 02/13/2021, 03/23/2022   Influenza, High Dose Seasonal PF 02/18/2015, 03/02/2016, 01/19/2017, 03/12/2018   Moderna SARS-COV2 Booster Vaccination 09/06/2019, 03/15/2020   Moderna Sars-Covid-2 Vaccination 05/31/2019, 06/28/2019   Pfizer Covid-19 Vaccine Bivalent Booster 20yrs & up 02/13/2021   Pneumococcal Conjugate-13 10/24/2013   Pneumococcal Polysaccharide-23 04/01/2005, 05/18/2005, 04/26/2012   RSV,unspecified 03/23/2022   Tdap 01/01/2012   Zoster, Live 01/01/2012, 06/26/2021, 09/10/2021        Objective:     BP 122/70 (BP Location: Left Arm, Cuff Size: Normal)   Pulse 76   Temp 98 F (36.7 C)   Ht 5\' 6"  (1.676 m)   Wt 145 lb (65.8 kg) Comment: per last office note. in a wheelchair today  SpO2 96%   BMI  23.40 kg/m   SpO2: 96 % O2 Device: None (Room air)  GENERAL: Well-developed, well-nourished elderly woman, presents in transport chair, no conversational dyspnea.  HEAD: Normocephalic, atraumatic.  EYES: Pupils equal, round, reactive to light.  No scleral icterus.  MOUTH: Edentulous, oral mucosa moist, no thrush. NECK: Supple. No thyromegaly. Trachea midline. No JVD.  No adenopathy. PULMONARY: Good air entry on the right, slight diminished breath sounds at left base.  Rare end expiratory wheezes noted bilaterally.  ABDOMEN: Benign. MUSCULOSKELETAL: No joint deformity, no clubbing, no edema.  NEUROLOGIC: No overt focal deficit SKIN: Intact,warm,dry.  Areas of prior melanoma excision in right forearm, well-healed. PSYCH: Anxious mood, normal behavior.  Chest x-ray performed today, formal interpretation pending, appears to be a left pleural effusion, also a left midlung field infiltrate versus atelectasis:   Point-of-care ultrasound was performed with Butterfly ultrasound probe and confirms effusion on the left chest.  Effusion does not appear complex.  Assessment & Plan:     ICD-10-CM   1. Stage 3 severe COPD by GOLD classification (HCC)  J44.9    Will switch to DuoNebs 4 times a day Continue albuterol as needed Discontinue Breztri Patient does not have breath holding capacity for proper MDI use    2. Community acquired pneumonia of left lower lobe of lung  J18.9 CT CHEST WO CONTRAST    DG Chest 2 View   Augmentin 875 mg twice daily Prednisone 20 mg daily x 5 days    3. Pleural effusion on left  J90 CT CHEST WO CONTRAST    DG Chest 2 View    US THORACENTESIS ASP PLEURAL SPACE W/IMG GUIDE    CANCELED: IR THORACENTESIS ASP PLEURAL SPACE W/IMG GUIDE   Patient declines thoracentesis today Arrange for thoracentesis tomorrow by IR Follow-up with CT chest after thoracentesis    4. Nocturnal hypoxemia due to emphysema [facility])  J43.9    G47.36    Patient compliant with nocturnal  oxygen Continue oxygen at 2 L/min    5. History of melanoma  Z85.820    This issue adds complexity to her management    6. Former smoker  Z87.891    In early remission No evidence of relapse      Orders Placed This Encounter  Procedures   CT CHEST WO CONTRAST    Standing Status:   Future    Standing Expiration Date:   02/04/2024    Order Specific Question:   Preferred imaging location?    Answer:   [facility]   DG Chest 2 View    Standing Status:   Future    Standing Expiration Date:   02/04/2024    Order Specific Question:   Reason for Exam (SYMPTOM  OR DIAGNOSIS REQUIRED)    Answer:   Pleural Effusion    Order Specific Question:   Preferred [facility] location?    Answer[facility]   US THORACENTESIS ASP PLEURAL SPACE W/IMG GUIDE    History of Melanoma and recent pneumonia    Standing Status:   Future    Standing Expiration Date:   02/04/2024    Order Specific Question:   Are labs required for specimen collection?    Answer:   Yes    Order Specific Question:   Lab orders requested (DO NOT place separate lab orders, these will be automatically ordered during procedure specimen collection):    Answer:   Microbiology specific labs    Order Specific Question:   Lab orders requested (DO NOT place separate lab orders, these will be automatically ordered during procedure specimen collection):    Answer:   Less frequent labs    Order Specific Question:   Lab orders requested (DO NOT place separate lab orders, these will be automatically ordered during procedure specimen collection):    Answer:   Oncology    Order Specific Question:   Lab orders requested (DO NOT place separate lab orders, these will be automatically ordered during procedure specimen collection):    Answer:   Body Fluid Cell Count w/ Differential    Order Specific Question:   Lab orders requested (DO NOT place separate lab orders, these will be automatically ordered  during procedure specimen collection):     Answer:   Glucose-Pleural Or Peritoneal Fluid    Order Specific Question:   Lab orders requested (DO NOT place separate lab orders, these will be automatically ordered during procedure specimen collection):    Answer:   Lactate Dehydrogenase-Body Fluid    Order Specific Question:   Lab orders requested (DO NOT place separate lab orders, these will be automatically ordered during procedure specimen collection):    Answer:   Protein-Pleural Or Peritoneal Fluid    Order Specific Question:   Microbiology specific labs:    Answer:   Acid Fast Smear and Acid Fast Culture w/ reflexed sensitivities    Order Specific Question:   Microbiology specific labs:    Answer:   Body Fluid culture w/Gram stain    Order Specific Question:   Microbiology specific labs:    Answer:   Fungus culture with stain    Order Specific Question:   Less frequent labs:    Answer:   Albumin-pleural or peritoneal fluid    Order Specific Question:   Less frequent labs:    Answer:   pH-body fluid    Order Specific Question:   Oncology specific labs:    Answer:   Cytology - Non Pap    Order Specific Question:   Reason for Exam (SYMPTOM  OR DIAGNOSIS REQUIRED)    Answer:   Pleural Effusion - LEFT    Order Specific Question:   Preferred imaging location?    Answer:   [facility]    Meds ordered this encounter  Medications   amoxicillin-clavulanate (AUGMENTIN) 875-125 MG tablet    Sig: Take 1 tablet by mouth 2 (two) times daily.    Dispense:  14 tablet    Refill:  0   ipratropium-albuterol (DUONEB) 0.5-2.5 (3) MG/3ML SOLN    Sig: Take 3 mLs by nebulization 4 (four) times daily.    Dispense:  360 mL    Refill:  11   predniSONE (DELTASONE) 20 MG tablet    Sig: Take 1 tablet (20 mg total) by mouth daily with breakfast for 5 days.    Dispense:  5 tablet    Refill:  0   Will proceed with getting the patient another 7 days of antibiotics.  She will have thoracentesis followed by CT of the chest tomorrow.  We will  see her in follow-up in 2 weeks time with either me or the nurse practitioner at that time.  She will have a chest x-ray upon follow-up as well.   Gailen Shelter, MD Advanced Bronchoscopy PCCM [facility]    *This note was dictated using voice recognition software/Dragon.  Despite best efforts to proofread, errors can occur which can change the meaning. Any transcriptional errors that result from this process are unintentional and may not be fully corrected at the time of dictation.

## 2023-02-05 ENCOUNTER — Other Ambulatory Visit: Payer: Self-pay | Admitting: Student

## 2023-02-05 ENCOUNTER — Ambulatory Visit
Admission: RE | Admit: 2023-02-05 | Discharge: 2023-02-05 | Disposition: A | Discharge status: Home / Self Care | Payer: Medicare Other | Source: Ambulatory Visit | Attending: Pulmonary Disease | Admitting: Pulmonary Disease

## 2023-02-05 ENCOUNTER — Other Ambulatory Visit: Payer: Self-pay

## 2023-02-05 ENCOUNTER — Ambulatory Visit
Admission: RE | Admit: 2023-02-05 | Discharge: 2023-02-05 | Disposition: A | Discharge status: Home / Self Care | Payer: Medicare Other | Source: Ambulatory Visit | Attending: Student

## 2023-02-05 DIAGNOSIS — Z9889 Other specified postprocedural states: Secondary | ICD-10-CM

## 2023-02-05 DIAGNOSIS — C3492 Malignant neoplasm of unspecified part of left bronchus or lung: Secondary | ICD-10-CM | POA: Insufficient documentation

## 2023-02-05 DIAGNOSIS — J90 Pleural effusion, not elsewhere classified: Secondary | ICD-10-CM | POA: Diagnosis not present

## 2023-02-05 DIAGNOSIS — J910 Malignant pleural effusion: Secondary | ICD-10-CM | POA: Diagnosis not present

## 2023-02-05 DIAGNOSIS — R918 Other nonspecific abnormal finding of lung field: Secondary | ICD-10-CM

## 2023-02-05 DIAGNOSIS — C7989 Secondary malignant neoplasm of other specified sites: Secondary | ICD-10-CM | POA: Diagnosis not present

## 2023-02-05 DIAGNOSIS — Z48813 Encounter for surgical aftercare following surgery on the respiratory system: Secondary | ICD-10-CM | POA: Diagnosis not present

## 2023-02-05 DIAGNOSIS — R590 Localized enlarged lymph nodes: Secondary | ICD-10-CM | POA: Diagnosis not present

## 2023-02-05 DIAGNOSIS — J189 Pneumonia, unspecified organism: Secondary | ICD-10-CM

## 2023-02-05 LAB — BODY FLUID CELL COUNT WITH DIFFERENTIAL
Eos, Fluid: 0 %
Lymphs, Fluid: 91 %
Monocyte-Macrophage-Serous Fluid: 6 %
Neutrophil Count, Fluid: 3 %
Total Nucleated Cell Count, Fluid: 524 cu mm

## 2023-02-05 LAB — LACTATE DEHYDROGENASE, PLEURAL OR PERITONEAL FLUID: LD, Fluid: 155 U/L — ABNORMAL HIGH (ref 3–23)

## 2023-02-05 LAB — ALBUMIN, PLEURAL OR PERITONEAL FLUID: Albumin, Fluid: 1.7 g/dL

## 2023-02-05 LAB — PROTEIN, PLEURAL OR PERITONEAL FLUID: Total protein, fluid: <3 g/dL

## 2023-02-05 LAB — GLUCOSE, PLEURAL OR PERITONEAL FLUID: Glucose, Fluid: 202 mg/dL

## 2023-02-05 MED ORDER — LIDOCAINE HCL (PF) 1 % IJ SOLN
10.0000 mL | Freq: Once | INTRAMUSCULAR | Status: AC
  Administered 2023-02-05: 10 mL via INTRADERMAL
  Filled 2023-02-05: qty 10

## 2023-02-05 NOTE — Procedures (Signed)
PROCEDURE SUMMARY:  Successful image-guided left thoracentesis. Yielded 250 mL of dark yellow fluid. Pt tolerated procedure well. No immediate complications. EBL = trace   Specimen was sent for labs. CXR ordered.  Please see imaging section of Epic for full dictation.  Kennieth Francois PA-C 02/05/2023 8:51 AM

## 2023-02-06 LAB — ACID FAST SMEAR (AFB, MYCOBACTERIA): Acid Fast Smear: NEGATIVE

## 2023-02-08 ENCOUNTER — Other Ambulatory Visit: Payer: Self-pay | Admitting: Pulmonary Disease

## 2023-02-08 ENCOUNTER — Encounter: Payer: Self-pay | Admitting: Pulmonary Disease

## 2023-02-08 LAB — BODY FLUID CULTURE W GRAM STAIN

## 2023-02-08 MED ORDER — LORAZEPAM 1 MG PO TABS
ORAL_TABLET | ORAL | 0 refills | Status: DC
Start: 2023-02-08 — End: 2023-02-22

## 2023-02-08 NOTE — Progress Notes (Signed)
Per daughter: "Hi, my mom Erica Rivas is having a pet scan Friday for that mass that you found in her lung. She wanted me to see if Dr Jayme Cloud could please call her in a few 1 mg of Ativan. When she was going to and from [facility] when she was having radiation and pet scans, they gave her some to help her with her anxiety. If you could please call her in a few for her test Friday the 27th, we would greatly appreciate it. Thank you Please let me know if she will,so I will be on the lookout for [facility] to call.  Thank you  Lynden Ang"  Reviewd records from [facility], indeed the patient did receive Ativan for her diagnostic procedures.  A prescription for 2 tablets of 1 mg Ativan sent to [facility] per her request.  Gailen Shelter, MD Advanced Bronchoscopy PCCM [facility]    *This note was dictated using voice recognition software/Dragon.  Despite best efforts to proofread, errors can occur which can change the meaning. Any transcriptional errors that result from this process are unintentional and may not be fully corrected at the time of dictation.

## 2023-02-08 NOTE — Telephone Encounter (Signed)
Will send Rx.

## 2023-02-12 ENCOUNTER — Encounter
Admission: RE | Admit: 2023-02-12 | Discharge: 2023-02-12 | Disposition: A | Discharge status: Home / Self Care | Payer: Medicare Other | Source: Ambulatory Visit | Attending: Pulmonary Disease | Admitting: Pulmonary Disease

## 2023-02-12 DIAGNOSIS — R918 Other nonspecific abnormal finding of lung field: Secondary | ICD-10-CM | POA: Diagnosis not present

## 2023-02-12 LAB — GLUCOSE, CAPILLARY: Glucose-Capillary: 148 mg/dL — ABNORMAL HIGH (ref 70–99)

## 2023-02-12 LAB — CYTOLOGY - NON PAP

## 2023-02-12 MED ORDER — FLUDEOXYGLUCOSE F - 18 (FDG) INJECTION
7.5000 | Freq: Once | INTRAVENOUS | Status: AC | PRN
  Administered 2023-02-12: 7.91 via INTRAVENOUS

## 2023-02-15 ENCOUNTER — Encounter: Payer: Self-pay | Admitting: Pulmonary Disease

## 2023-02-15 ENCOUNTER — Encounter: Payer: Self-pay | Admitting: Family Medicine

## 2023-02-15 NOTE — Telephone Encounter (Signed)
The PET has not been read by the radiologist yet.  These usually take anywhere between 7 to 10 days.  We can discuss preliminary when she comes to her visit on Thursday.  I do not manage anxiety medications on a chronic basis.  It would be best if she could call Dr. Sherrie Mustache, her primary care physician as he may be able to help her more with a mild antidepressant and anxiety medication.

## 2023-02-16 LAB — MISC LABCORP TEST (SEND OUT): Labcorp test code: 9985

## 2023-02-17 ENCOUNTER — Encounter: Payer: Self-pay | Admitting: Pulmonary Disease

## 2023-02-17 NOTE — Telephone Encounter (Signed)
No we do not need CXR

## 2023-02-17 NOTE — Telephone Encounter (Signed)
Does she need to have the chest xray done before her appointment tomorrow?

## 2023-02-18 ENCOUNTER — Encounter: Payer: Self-pay | Admitting: Pulmonary Disease

## 2023-02-18 ENCOUNTER — Ambulatory Visit (INDEPENDENT_AMBULATORY_CARE_PROVIDER_SITE_OTHER): Payer: Medicare Other | Admitting: Pulmonary Disease

## 2023-02-18 VITALS — BP 118/78 | HR 88 | Temp 98.1°F | Ht 66.0 in | Wt 145.0 lb

## 2023-02-18 DIAGNOSIS — C3492 Malignant neoplasm of unspecified part of left bronchus or lung: Secondary | ICD-10-CM

## 2023-02-18 DIAGNOSIS — G4736 Sleep related hypoventilation in conditions classified elsewhere: Secondary | ICD-10-CM | POA: Diagnosis not present

## 2023-02-18 DIAGNOSIS — J439 Emphysema, unspecified: Secondary | ICD-10-CM | POA: Diagnosis not present

## 2023-02-18 DIAGNOSIS — J449 Chronic obstructive pulmonary disease, unspecified: Secondary | ICD-10-CM | POA: Diagnosis not present

## 2023-02-18 DIAGNOSIS — C3490 Malignant neoplasm of unspecified part of unspecified bronchus or lung: Secondary | ICD-10-CM | POA: Diagnosis not present

## 2023-02-18 DIAGNOSIS — Z85820 Personal history of malignant melanoma of skin: Secondary | ICD-10-CM | POA: Diagnosis not present

## 2023-02-18 DIAGNOSIS — J90 Pleural effusion, not elsewhere classified: Secondary | ICD-10-CM

## 2023-02-18 DIAGNOSIS — C3432 Malignant neoplasm of lower lobe, left bronchus or lung: Secondary | ICD-10-CM | POA: Diagnosis not present

## 2023-02-18 DIAGNOSIS — J910 Malignant pleural effusion: Secondary | ICD-10-CM | POA: Diagnosis not present

## 2023-02-18 NOTE — Progress Notes (Signed)
Subjective:    Patient ID: Erica Rivas, female    DOB: [birth year], 82 y.o.   MRN: [id number]  Patient Care Team: Malva Limes, MD as PCP - General (Family Medicine) Dasher, Cliffton Asters, MD as Consulting Physician (Dermatology) Sherrie George, MD as Consulting Physician (Ophthalmology) Harrel Carina, MD as Referring Physician (Surgery) Salena Saner, MD as Consulting Physician (Pulmonary Disease) Pa, [facility]  Chief Complaint  Patient presents with   Follow-up    DOE. Wheezing. Cough with clear sputum.   HPI Erica Rivas is an 82 year old recent former smoker (quit in August) with a 60-pack-year history of smoking and a history as noted below who presents today for follow-up of shortness of breath and wheezing and cough productive of clear sputum which started around 05 January 2023.  We evaluated her as an acute visit on 4 September and subsequently had follow-up visit on 04 February 2023.  At her 4 September appointment she was treated for acute exacerbation of COPD, she received Medrol Dosepak and doxycycline to treat.   Recall that she has stage III COPD and is maintained on Breztri and as needed albuterol.  She sustained a fall previously and developed chest pain after that.  It is confirmed that she had multiple rib fractures of the right fourth and fifth ribs and a new heterogeneous area overlying the left midlung likely representing pneumonitis versus scarring or atelectasis.  On follow-up visit chest x-ray showed increased left lung density and potential pleural effusion.  This was confirmed with point-of-care ultrasound during the visit.  The patient also had her inhaler switched to nebulizer treatments due to inability to breath-hold to effectively get benefit from the medications.    At her 19 September visit she was scheduled for a thoracentesis followed by CT chest at that time.  She underwent these procedures on 20 September.  The CT confirmed a left lung  mass with mediastinal adenopathy and residual pleural effusion.  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  She had PET/CT on 27 September today she presents to discuss these studies and next steps.  She presents with her 3 daughters.  I have recommended that we get her to see one of our oncologist to discuss management of her adenocarcinoma.  She is not sure if she would want to go through chemotherapy however is willing to discuss it.  Recall she has had a history of malignant melanoma of the right forearm she has been followed at [facility] for this issue.  In October 2023 she had a PET/CT that showed a left lower lobe peripheral 0.6 cm avid nodule and 2 left upper lobe peripheral mildly avid micronodules with recommended follow-up in 6 to 12 weeks.  It was followed with a chest CT on 08 April 2022 at [facility] showing interval increase in the size of the left-sided mediastinal lymph nodes consistent with nodal metastasis however there were no suspicious pulmonary nodules noted.  At this point it was elected to follow her in February with a PET/CT.  It appears however she never had this imaging done and counseled her appointments at [facility] due to insurance issues.  This was also during the time she was lost to follow-up from our clinic.  Overall she feels better since switching her inhalers to nebulizers.  She also felt some relief from the thoracentesis.  She and her daughters had multiple questions today and these were answered to the best of my ability and to their satisfaction.  DATA/EVENTS 08/21/2020 LDCT lung cancer screening: Left lower lobe pulmonary nodule classified as lung RADS 4AS at that time because of a history of melanoma she had SBRT to this nodule at [facility].  03/10/2021 to 06/13/2021 chemotherapy: Underwent melanoma chemotherapy at [facility] 06/16/2021 PET/CT [facility]: Showed decrease in the size of that nodule without FDG uptake and no other FDG uptake was noted. 09/16/2021 PFTs: FEV1 1.08 L or  49% predicted, FVC 2.21 L or 76% predicted, FEV1/FVC 49%.  There is significant bronchodilator response.  Fusion capacity moderately reduced.  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  Asthma/COPD overlap. 09/22/2021 PET/CT [facility]: Previously seen left lower lobe nodule no longer visualized, no other sites of metastatic disease. 12/09/2021 overnight oximetry on room air: Met criteria for supplemental oxygen nocturnally.   02/23/2022 PET/CT [facility]: Left lower lobe peripheral 0.6 cm avid nodule and 2 left upper lobe peripheral mildly avid micronodules with avid left-sided mediastinal adenopathy new since prior, may represent sequela of resolving infectious/inflammatory process however, metastatic disease cannot be excluded.  Recommend follow-up CT 6 to 12 weeks.  Persistent uptake on the walls of the stomach proximal duodenum that could represent inflammatory process, advanced coronary disease.  Review of Systems A 10 point review of systems was performed and it is as noted above otherwise negative.   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) 09/30/2020   Malignant melanoma of right forearm (HCC) 09/11/2020   Carotid stenosis, right 11/13/2019   Carotid stenosis 10/05/2019   Emphysema lung (HCC) 09/25/2019   Moderate episode of recurrent major depressive disorder (HCC) 07/05/2018   Duodenitis 01/21/2018   Iron deficiency anemia 11/25/2017   Atherosclerosis of aorta (HCC) 07/23/2016   Coronary atherosclerosis 07/23/2016   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/2015   Allergic rhinitis 02/18/2015   Anxiety 02/18/2015   Aortic root dilation (HCC) 02/18/2015    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   Edema 02/18/2015   Insomnia 02/18/2015   Lung nodule 02/18/2015   Mass of pancreas 02/18/2015   Osteopenia 02/18/2015   Elevated WBC count 02/18/2015   Weight loss 02/18/2015   Diastolic dysfunction 02/18/2015   Vitamin B12 deficiency anemia 11/04/2014   Vitamin D deficiency 10/27/2013   Internal hemorrhoids without complication 09/16/2009   Smoking greater than 30 pack years 07/08/2007   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   Disc disorder of lumbar region 07/08/2007   Esophageal reflux 07/08/2007   Family history of breast cancer 07/08/2007   History of adenomatous polyp of colon 07/08/2007   History of other malignant neoplasm of skin 07/08/2007   Primary hypertension 07/08/2007   Pure hypercholesterolemia 07/08/2007    Social History   Tobacco Use   Smoking status: Former    Current packs/day: 2.00    Average packs/day: 2.0 packs/day for 60.0 years (120.0 ttl pk-yrs)    Types: Cigarettes   Smokeless tobacco: Never   Tobacco comments:    Quit 01/12/2023- khj 02/18/2023  Substance Use Topics   Alcohol use: No    Alcohol/week: 0.0 standard drinks of alcohol    Allergies  Allergen Reactions   Azithromycin     Throat felt "tight" Lost her  voice   Bupropion Other (See Comments)    fatigue    Current Meds  Medication Sig   albuterol (PROVENTIL) (2.5 MG/3ML) 0.083% nebulizer solution TAKE 3 MLS BY NEBULIZATION 4 TIMES DAILY AS NEEDED FOR WHEEZING OR SHORTNESS OF BREATH (AS NEEDEDUP TO 4 TIMES PER DAY)   albuterol (VENTOLIN HFA) 108 (90 Base) MCG/ACT inhaler Inhale 2 puffs into the lungs every 6 (six) hours as needed for wheezing or shortness of breath.   amLODipine (NORVASC) 5 MG tablet TAKE 1 TABLET BY MOUTH DAILY   amoxicillin-clavulanate (AUGMENTIN) 875-125 MG tablet Take 1 tablet by mouth 2 (two) times daily.   aspirin EC 81 MG tablet Take 81 mg by mouth daily.   atenolol  (TENORMIN) 50 MG tablet TAKE 1 TABLET BY MOUTH TWICE  DAILY   atorvastatin (LIPITOR) 80 MG tablet TAKE 1 TABLET BY MOUTH ONCE  DAILY   Blood Glucose Monitoring Suppl DEVI 1 each by Does not apply route in the morning, at noon, and at bedtime. May substitute to any manufacturer covered by patient's insurance.   buPROPion (WELLBUTRIN XL) 300 MG 24 hr tablet Take 1 tablet (300 mg total) by mouth daily.   Cholecalciferol (VITAMIN D) 2000 UNITS tablet Take 1,000 Units by mouth daily.    cyanocobalamin (VITAMIN B12) 1000 MCG tablet Take 1 tablet (1,000 mcg total) by mouth daily.   furosemide (LASIX) 20 MG tablet Take 1 tablet (20 mg total) by mouth daily as needed (swelling).   glipiZIDE (GLUCOTROL XL) 10 MG 24 hr tablet TAKE 1 TABLET BY MOUTH DAILY   Glucose Blood (BLOOD GLUCOSE TEST STRIPS) STRP 1 each by In Vitro route in the morning, at noon, and at bedtime. May substitute to any manufacturer covered by patient's insurance.   glucose blood (ONETOUCH ULTRA) test strip (Onetouch Ultra Blue) Use strip daily to check blood sugar for type 2 diabetes (E11.9)   hydrOXYzine (ATARAX) 25 MG tablet Take 12.5 mg by mouth at bedtime.   ipratropium-albuterol (DUONEB) 0.5-2.5 (3) MG/3ML SOLN Take 3 mLs by nebulization 4 (four) times daily.   Iron-FA-B Cmp-C-Biot-Probiotic (FUSION PLUS) CAPS Take 1 capsule by mouth daily.   Lancets Misc. MISC 1 each by Does not apply route in the morning, at noon, and at bedtime. May substitute to any manufacturer covered by patient's insurance.   lipase/protease/amylase (CREON) 36000 UNITS CPEP capsule Take 2 capsules with the first bite of each meal and 1 capsule with the first bite of each snack   lisinopril-hydrochlorothiazide (ZESTORETIC) 20-25 MG tablet TAKE 1 TABLET BY MOUTH DAILY   LORazepam (ATIVAN) 1 MG tablet Take one tablet if needed, prior to PET CT, may repeat x 1 if needed.   metFORMIN (GLUCOPHAGE-XR) 500 MG 24 hr tablet Take 1 tablet (500 mg total) by mouth daily with  breakfast.   montelukast (SINGULAIR) 10 MG tablet Take 1 tablet (10 mg total) by mouth daily.   omeprazole (PRILOSEC) 40 MG capsule TAKE 1 CAPSULE BY MOUTH DAILY   OneTouch Delica Lancets 33G MISC USE TO CHECK BLOOD SUGAR DAILY   OXYGEN Inhale 2 L into the lungs at bedtime. Use at night   valACYclovir (VALTREX) 1000 MG tablet 2 tablets twice a day for 1 day as needed for cold sores    Immunization History  Administered Date(s) Administered   Fluad Quad(high Dose 65+) 01/25/2019, 01/26/2020, 02/13/2021, 03/23/2022   Influenza, High Dose Seasonal PF 02/18/2015, 03/02/2016, 01/19/2017, 03/12/2018   Moderna Covid-19 Fall Seasonal Vaccine 59yrs & older 03/23/2022  Moderna SARS-COV2 Booster Vaccination 09/06/2019, 03/15/2020   Moderna Sars-Covid-2 Vaccination 05/31/2019, 06/28/2019   Pfizer Covid-19 Vaccine Bivalent Booster 35yrs & up 02/13/2021   Pneumococcal Conjugate-13 10/24/2013   Pneumococcal Polysaccharide-23 04/01/2005, 05/18/2005, 04/26/2012   RSV,unspecified 03/23/2022   Tdap 01/01/2012   Zoster, Live 01/01/2012, 06/26/2021, 09/10/2021      Objective:     BP 118/78 (BP Location: Right Arm, Cuff Size: Normal)   Pulse 88   Temp 98.1 F (36.7 C)   Ht 5\' 6"  (1.676 m)   Wt 145 lb (65.8 kg)   SpO2 98%   BMI 23.40 kg/m   SpO2: 98 % O2 Device: None (Room air)  GENERAL: Well-developed, well-nourished elderly woman, presents in transport chair, no conversational dyspnea.  HEAD: Normocephalic, atraumatic.  EYES: Pupils equal, round, reactive to light.  No scleral icterus.  MOUTH: Edentulous, oral mucosa moist, no thrush. NECK: Supple. No thyromegaly. Trachea midline. No JVD.  No adenopathy. PULMONARY: Good air entry on the right, slight diminished breath sounds at left base.  Rare end expiratory wheezes noted bilaterally.  ABDOMEN: Benign. MUSCULOSKELETAL: No joint deformity, no clubbing, no edema.  NEUROLOGIC: No overt focal deficit SKIN: Intact,warm,dry.  Areas of prior  melanoma excision in right forearm, well-healed. PSYCH: Anxious mood, normal behavior.   Presented images from CT chest performed 05 February 2023 showing a large left lower lobe mass which extends into the left hilum and left upper lobe measuring 6.7 cm consistent with primary lung malignancy.  There is a large mediastinal adenopathy, trace pleural effusion and pleural nodularity concerning for pleural metastatic disease, scattered solid pulmonary nodules which are new concerning for metastatic disease:      Representative image from the PET/CT performed 12 February 2023 showing intense FDG activity on the pulmonary mass and mediastinal adenopathy, formal radiologist interpretation is pending:   Assessment & Plan:     ICD-10-CM   1. Adenocarcinoma, lung, left (HCC)  C34.92 Venipunc need phys skill,dx or rx    Ambulatory referral to Hematology / Oncology MR BRAIN W WO CONTRAST   Circulogene assay drawn today Patient will be referred to medical oncology Will obtain MRI brain to exclude metastas    2. Stage 3 severe COPD by GOLD classification (HCC)  J44.9    Appears compensated at present Notices improvement of symptoms on nebulizer treatments Continue DuoNeb  QID Continue as needed albuterol    3. Pleural effusion on left  J90    Status post thoracentesis Adenocarcinoma of the lung identified Monitor for reaccumulation    4. Nocturnal hypoxemia due to emphysema [facility])  J43.9    G47.36    Patient compliant with oxygen at 2 L/min Continue O2 nocturnally     Orders Placed This Encounter  Procedures   MR BRAIN W WO CONTRAST    Standing Status:   Future    Standing Expiration Date:   02/18/2024    Order Specific Question:   If indicated for the ordered procedure, I authorize the administration of contrast media per Radiology protocol    Answer:   Yes    Order Specific Question:   What is the patient's sedation requirement?    Answer:   Anti-anxiety    Order Specific  Question:   Does the patient have a pacemaker or implanted devices?    Answer:   No    Order Specific Question:   Preferred imaging location?    Answer:   [facility] (table limit - 550lbs)   Ambulatory referral to  Hematology / Oncology    Referral Priority:   Urgent    Referral Type:   Consultation    Referral Reason:   Specialty Services Required    Requested Specialty:   Oncology    Number of Visits Requested:   1   Venipunc need phys skill,dx or rx    Will see the patient in follow-up in 4 to 6 weeks time she is to contact us prior to that time should any new problems arise.  Appropriate referrals to oncology have been made.   Gailen Shelter, MD Advanced Bronchoscopy PCCM [facility]    *This note was dictated using voice recognition software/Dragon.  Despite best efforts to proofread, errors can occur which can change the meaning. Any transcriptional errors that result from this process are unintentional and may not be fully corrected at the time of dictation.

## 2023-02-18 NOTE — Patient Instructions (Signed)
We drew the blood on the potential mutations of the tumor.  We have ordered an MRI of the brain as part of the staging for this tumor.  Have sent the referral to oncology and you should be hearing from them about an appointment to see the oncologist.  The nurse navigator for lung cancer is Hayley Rhode,RN.  I will see you in follow-up in 4 to 6 weeks time call sooner should any new problems arise.

## 2023-02-19 ENCOUNTER — Other Ambulatory Visit: Payer: Self-pay | Admitting: Family Medicine

## 2023-02-19 DIAGNOSIS — E1140 Type 2 diabetes mellitus with diabetic neuropathy, unspecified: Secondary | ICD-10-CM

## 2023-02-19 NOTE — Telephone Encounter (Signed)
Requested Prescriptions  Pending Prescriptions Disp Refills   Blood Glucose Monitoring Suppl (ONE TOUCH ULTRA 2) w/Device KIT [Pharmacy Med Name: ONETOUCH ULTRA 2   KIT] 1 kit 0    Sig: USE TO TEST BLOOD SUGAR IN THE MORNING, AT NOON, AND AT BEDTIME     Endocrinology: Diabetes - Testing Supplies Passed - 02/19/2023  9:04 AM      Passed - Valid encounter within last 12 months    Recent Outpatient Visits           3 weeks ago Type 2 diabetes mellitus with diabetic neuropathy, without long-term current use of insulin (HCC)   [place] [facility] Malva Limes, MD   1 month ago Chronic obstructive pulmonary disease with acute exacerbation (HCC)   [place] [facility] [place], Monico Blitz, DO   5 months ago Type 2 diabetes mellitus with diabetic neuropathy, without long-term current use of insulin (HCC)   [place] [facility] Malva Limes, MD   9 months ago Type 2 diabetes mellitus with diabetic neuropathy, without long-term current use of insulin (HCC)   [place] [facility] Malva Limes, MD   1 year ago Type 2 diabetes mellitus with diabetic neuropathy, without long-term current use of insulin (HCC)    [facility] Malva Limes, MD       Future Appointments             In 3 months Fisher, Demetrios Isaacs, MD [facility]   In 3 months Deirdre Evener, MD [facility]

## 2023-02-22 ENCOUNTER — Encounter: Payer: Self-pay | Admitting: Pulmonary Disease

## 2023-02-22 ENCOUNTER — Encounter: Payer: Self-pay | Admitting: Oncology

## 2023-02-22 ENCOUNTER — Other Ambulatory Visit: Payer: Self-pay | Admitting: *Deleted

## 2023-02-22 ENCOUNTER — Inpatient Hospital Stay: Payer: Medicare Other

## 2023-02-22 ENCOUNTER — Inpatient Hospital Stay: Payer: Medicare Other | Attending: Oncology | Admitting: Oncology

## 2023-02-22 VITALS — BP 139/58 | HR 85 | Temp 96.9°F | Resp 18 | Ht 66.0 in | Wt 141.8 lb

## 2023-02-22 DIAGNOSIS — Z79899 Other long term (current) drug therapy: Secondary | ICD-10-CM | POA: Insufficient documentation

## 2023-02-22 DIAGNOSIS — Z7984 Long term (current) use of oral hypoglycemic drugs: Secondary | ICD-10-CM | POA: Insufficient documentation

## 2023-02-22 DIAGNOSIS — C3491 Malignant neoplasm of unspecified part of right bronchus or lung: Secondary | ICD-10-CM | POA: Insufficient documentation

## 2023-02-22 DIAGNOSIS — F419 Anxiety disorder, unspecified: Secondary | ICD-10-CM | POA: Insufficient documentation

## 2023-02-22 DIAGNOSIS — N1832 Chronic kidney disease, stage 3b: Secondary | ICD-10-CM | POA: Insufficient documentation

## 2023-02-22 DIAGNOSIS — E1122 Type 2 diabetes mellitus with diabetic chronic kidney disease: Secondary | ICD-10-CM | POA: Insufficient documentation

## 2023-02-22 DIAGNOSIS — I129 Hypertensive chronic kidney disease with stage 1 through stage 4 chronic kidney disease, or unspecified chronic kidney disease: Secondary | ICD-10-CM | POA: Insufficient documentation

## 2023-02-22 DIAGNOSIS — E7800 Pure hypercholesterolemia, unspecified: Secondary | ICD-10-CM | POA: Diagnosis not present

## 2023-02-22 DIAGNOSIS — Z7189 Other specified counseling: Secondary | ICD-10-CM

## 2023-02-22 DIAGNOSIS — K219 Gastro-esophageal reflux disease without esophagitis: Secondary | ICD-10-CM | POA: Insufficient documentation

## 2023-02-22 DIAGNOSIS — C3492 Malignant neoplasm of unspecified part of left bronchus or lung: Secondary | ICD-10-CM | POA: Diagnosis not present

## 2023-02-22 MED ORDER — LORAZEPAM 0.5 MG PO TABS
0.5000 mg | ORAL_TABLET | Freq: Once | ORAL | 0 refills | Status: AC
Start: 2023-02-22 — End: 2023-02-22

## 2023-02-22 MED ORDER — OXYCODONE HCL 5 MG PO TABS: 5.0000 mg | ORAL_TABLET | Freq: Every evening | ORAL | 0 refills | Status: DC | PRN

## 2023-02-22 NOTE — Progress Notes (Signed)
Hematology/Oncology Consult note [facility] Telephone:([phone] [phone]  Patient Care Team: Malva Limes, MD as PCP - General (Family Medicine) Dasher, Cliffton Asters, MD as Consulting Physician (Dermatology) Sherrie George, MD as Consulting Physician (Ophthalmology) Harrel Carina, MD as Referring Physician (Surgery) Salena Saner, MD as Consulting Physician (Pulmonary Disease) Pa, [facility]   Name of the patient: Erica Rivas  [id number]  [birth year]    Reason for referral-new diagnosis of lung cancer   Referring physician-Dr. [name]  Date of visit: 02/22/23   History of presenting illness- Patient is a 82 year old female who was seen by Dr. Jayme Cloud from pulmonary and underwent a CT chest without contrast in September 2024.  CT scan showed a large left lower mass extending into the left hilum and left upper lobe measuring 6.7 cm with enlarged mediastinal left hilar and left supraclavicular lymph nodes consistent with nodal metastatic disease.  There were bilateral small solid lung nodules which were new as compared to her prior CT scan in 2022 and were concerning for metastatic disease.  She has a longstanding history of smoking 60 pack years and quit smoking sometime in August 2024.  She has stage III COPD.  Patient had a PET scan on 02/12/2023 the results of which are pending.  Cytology from pleural fluid was consistent with adenocarcinoma.  Patient is here with her family which includes her 2 daughters.  She currently reports feeling fatigued.  Denies any significant pain.  Appetite is poor.  ECOG PS- 2  Pain scale- 0   Review of systems- Review of Systems  Constitutional:  Positive for malaise/fatigue and weight loss.    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   Carotid stenosis, right 11/13/2019   Carotid stenosis 10/05/2019   Emphysema lung (HCC) 09/25/2019   Moderate episode of recurrent major depressive disorder (HCC) 07/05/2018   Duodenitis 01/21/2018   Iron deficiency anemia 11/25/2017   Atherosclerosis of aorta (HCC) 07/23/2016   Coronary atherosclerosis 07/23/2016   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/2015   Allergic rhinitis 02/18/2015   Anxiety 02/18/2015   Aortic root dilation (HCC) 02/18/2015   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   Edema 02/18/2015   Insomnia 02/18/2015   Lung nodule 02/18/2015   Mass of pancreas 02/18/2015   Osteopenia 02/18/2015   Elevated WBC count 02/18/2015   Weight loss 02/18/2015   Diastolic dysfunction 02/18/2015   Vitamin B12 deficiency anemia 11/04/2014   Vitamin D deficiency 10/27/2013   Internal hemorrhoids without complication 09/16/2009   Smoking greater than 30 pack years 07/08/2007   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   Disc disorder of lumbar region 07/08/2007   Esophageal reflux 07/08/2007   Family history of breast cancer 07/08/2007   History of adenomatous polyp of colon 07/08/2007   History of other malignant neoplasm of skin 07/08/2007   Primary hypertension 07/08/2007   Pure hypercholesterolemia 07/08/2007  Past Medical History:  Diagnosis Date   Actinic keratosis    Anemia    Colon polyps    COPD (chronic obstructive pulmonary disease) (HCC)    Depressive disorder 07/08/2007   Diabetes mellitus without complication (HCC)    GERD (gastroesophageal reflux disease)    Hemorrhoids    History  of chicken pox    History of measles    History of mumps    Hypertension    Lung nodule 02/18/2015   No additional follow up needed per CT report 03/05/2015    Melanoma (HCC) 08/14/2020   right forearm, excised 09/17/2020 [facility]   SCC (squamous cell carcinoma), hand    excision SCC left forearm and left hand 09/05/21 Dr. 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 Keratoacanthoma type. EDC     Past Surgical History:  Procedure Laterality Date   ABDOMINAL HYSTERECTOMY  1985   DUB; ovaries resected/ removed   BREAST BIOPSY Right 11/12/2014   fibroadenomatous   CARDIOVASCULAR STRESS TEST  02/2008   low risk   CAROTID PTA/STENT INTERVENTION Right 11/13/2019   Procedure: CAROTID PTA/STENT INTERVENTION;  Surgeon: Annice Needy, MD;  Location: [facility] INVASIVE CV LAB;  Service: Cardiovascular;  Laterality: Right;   COLONOSCOPY WITH PROPOFOL N/A 01/18/2018   Procedure: COLONOSCOPY WITH PROPOFOL;  Surgeon: Toney Reil, MD;  Location: [facility] ENDOSCOPY;  Service: Gastroenterology;  Laterality: N/A;   COLONOSCOPY WITH PROPOFOL N/A 02/18/2022   Procedure: COLONOSCOPY WITH PROPOFOL;  Surgeon: Toney Reil, MD;  Location: [facility] ENDOSCOPY;  Service: Gastroenterology;  Laterality: N/A;   DOPPLER ECHOCARDIOGRAPHY  01/2010   EF>55%, LV relaxation impairment consistent with diastolic dysfunction, mild TR and MR   ESOPHAGOGASTRODUODENOSCOPY  07/2009   +diffuse gastritis. Iftikhar   ESOPHAGOGASTRODUODENOSCOPY (EGD) WITH PROPOFOL N/A 01/18/2018   Procedure: ESOPHAGOGASTRODUODENOSCOPY (EGD) WITH PROPOFOL;  Surgeon: Toney Reil, MD;  Location: [facility] ENDOSCOPY;  Service: Gastroenterology;  Laterality: N/A;   ESOPHAGOGASTRODUODENOSCOPY (EGD) WITH PROPOFOL N/A 02/18/2022   Procedure: ESOPHAGOGASTRODUODENOSCOPY (EGD)  WITH PROPOFOL;  Surgeon: Toney Reil, MD;  Location: [facility] ENDOSCOPY;  Service: Gastroenterology;  Laterality: N/A;   GALLBLADDER SURGERY  1994   HAND SURGERY Right 2010   surgery x 2. Dr. Hyacinth Meeker   MELANOMA EXCISION Right 09/17/2020   Wide excision r forearm Dr. Stacey Drain, [facility]   NECK SURGERY  1998   Disc surgey on neck   Sinus Surgery     TUBAL LIGATION  1971    Social History   Socioeconomic History   Marital status: Married    Spouse name: Not on file   Number of children: 2   Years of education: Not on file   Highest education level: 10th grade  Occupational History   Occupation: Retired    Comment: Formerly worked at [employer]. Retired in 2007  Tobacco Use   Smoking status: Former    Current packs/day: 2.00    Average packs/day: 2.0 packs/day for 60.0 years (120.0 ttl pk-yrs)    Types: Cigarettes   Smokeless tobacco: Never   Tobacco comments:    Quit 01/12/2023- khj 02/18/2023  Vaping Use   Vaping status: Never Used  Substance and Sexual Activity   Alcohol use: No    Alcohol/week: 0.0 standard drinks of alcohol  Drug use: No   Sexual activity: Not on file  Other Topics Concern   Not on file  Social History Narrative   Not on file   Social Determinants of Health   Financial Resource Strain: Low Risk  (12/14/2022)   Overall Financial Resource Strain (CARDIA)    Difficulty of Paying Living Expenses: Not hard at all  Food Insecurity: No Food Insecurity (12/14/2022)   Hunger Vital Sign    Worried About Running Out of Food in the Last Year: Never true    Ran Out of Food in the Last Year: Never true  Transportation Needs: No Transportation Needs (12/14/2022)   PRAPARE - Administrator, Civil Service (Medical): No    Lack of Transportation (Non-Medical): No  Physical Activity: Inactive (12/14/2022)   Exercise Vital Sign    Days of Exercise per Week: 0 days    Minutes of Exercise per Session: 0 min  Stress: No Stress Concern  Present (12/14/2022)   [employer] of Occupational Health - Occupational Stress Questionnaire    Feeling of Stress : Not at all  Social Connections: Moderately Integrated (12/14/2022)   Social Connection and Isolation Panel [NHANES]    Frequency of Communication with Friends and Family: More than three times a week    Frequency of Social Gatherings with Friends and Family: Once a week    Attends Religious Services: More than 4 times per year    Active Member of [employer] or Organizations: No    Attends Banker Meetings: Never    Marital Status: Married  Catering manager Violence: Not At Risk (12/14/2022)   Humiliation, Afraid, Rape, and Kick questionnaire    Fear of Current or Ex-Partner: No    Emotionally Abused: No    Physically Abused: No    Sexually Abused: No     Family History  Problem Relation Age of Onset   Hypertension Sister    COPD Sister    Thyroid disease Sister    Breast cancer Sister 33   Thyroid disease Sister    Heart attack Mother    Hypertension Mother    Thyroid disease Mother        HYPERTHYROIDISM   GI Bleed Father    Lung cancer Brother    Breast cancer Sister    Hypertension Sister    Aneurysm Sister        in the back of her neck   Heart attack Brother        CABG     Current Outpatient Medications:    albuterol (PROVENTIL) (2.5 MG/3ML) 0.083% nebulizer solution, TAKE 3 MLS BY NEBULIZATION 4 TIMES DAILY AS NEEDED FOR WHEEZING OR SHORTNESS OF BREATH (AS NEEDEDUP TO 4 TIMES PER DAY), Disp: 150 mL, Rfl: 0   albuterol (VENTOLIN HFA) 108 (90 Base) MCG/ACT inhaler, Inhale 2 puffs into the lungs every 6 (six) hours as needed for wheezing or shortness of breath., Disp: 18 g, Rfl: 2   amLODipine (NORVASC) 5 MG tablet, TAKE 1 TABLET BY MOUTH DAILY, Disp: 100 tablet, Rfl: 2   aspirin EC 81 MG tablet, Take 81 mg by mouth daily., Disp: , Rfl:    atenolol (TENORMIN) 50 MG tablet, TAKE 1 TABLET BY MOUTH TWICE  DAILY, Disp: 200 tablet, Rfl: 3    atorvastatin (LIPITOR) 80 MG tablet, TAKE 1 TABLET BY MOUTH ONCE  DAILY, Disp: 100 tablet, Rfl: 3   Blood Glucose Monitoring Suppl (ONE TOUCH ULTRA 2) w/Device KIT, USE TO TEST BLOOD  SUGAR IN THE MORNING, AT NOON, AND AT BEDTIME, Disp: 1 kit, Rfl: 0   buPROPion (WELLBUTRIN XL) 300 MG 24 hr tablet, Take 1 tablet (300 mg total) by mouth daily., Disp: 90 tablet, Rfl: 3   Cholecalciferol (VITAMIN D) 2000 UNITS tablet, Take 1,000 Units by mouth daily. , Disp: , Rfl:    cyanocobalamin (VITAMIN B12) 1000 MCG tablet, Take 1 tablet (1,000 mcg total) by mouth daily., Disp: 30 tablet, Rfl: 5   glipiZIDE (GLUCOTROL XL) 10 MG 24 hr tablet, TAKE 1 TABLET BY MOUTH DAILY, Disp: 100 tablet, Rfl: 2   Glucose Blood (BLOOD GLUCOSE TEST STRIPS) STRP, 1 each by In Vitro route in the morning, at noon, and at bedtime. May substitute to any manufacturer covered by patient's insurance., Disp: 100 strip, Rfl: 3   glucose blood (ONETOUCH ULTRA) test strip, (Onetouch Ultra Blue) Use strip daily to check blood sugar for type 2 diabetes (E11.9), Disp: 100 each, Rfl: 3   hydrOXYzine (ATARAX) 25 MG tablet, Take 12.5 mg by mouth at bedtime., Disp: , Rfl:    ipratropium-albuterol (DUONEB) 0.5-2.5 (3) MG/3ML SOLN, Take 3 mLs by nebulization 4 (four) times daily., Disp: 360 mL, Rfl: 11   Lancets Misc. MISC, 1 each by Does not apply route in the morning, at noon, and at bedtime. May substitute to any manufacturer covered by patient's insurance., Disp: 100 each, Rfl: 3   lisinopril-hydrochlorothiazide (ZESTORETIC) 20-25 MG tablet, TAKE 1 TABLET BY MOUTH DAILY, Disp: 100 tablet, Rfl: 4   LORazepam (ATIVAN) 1 MG tablet, Take one tablet if needed, prior to PET CT, may repeat x 1 if needed., Disp: 2 tablet, Rfl: 0   metFORMIN (GLUCOPHAGE-XR) 500 MG 24 hr tablet, Take 1 tablet (500 mg total) by mouth daily with breakfast., Disp: 180 tablet, Rfl: 3   montelukast (SINGULAIR) 10 MG tablet, Take 1 tablet (10 mg total) by mouth daily., Disp: 90  tablet, Rfl: 3   omeprazole (PRILOSEC) 40 MG capsule, TAKE 1 CAPSULE BY MOUTH DAILY, Disp: 70 capsule, Rfl: 4   OneTouch Delica Lancets 33G MISC, USE TO CHECK BLOOD SUGAR DAILY, Disp: 100 each, Rfl: 3   OXYGEN, Inhale 2 L into the lungs at bedtime. Use at night, Disp: , Rfl:    amoxicillin-clavulanate (AUGMENTIN) 875-125 MG tablet, Take 1 tablet by mouth 2 (two) times daily. (Patient not taking: Reported on 02/22/2023), Disp: 14 tablet, Rfl: 0   furosemide (LASIX) 20 MG tablet, Take 1 tablet (20 mg total) by mouth daily as needed (swelling). (Patient not taking: Reported on 02/22/2023), Disp: 20 tablet, Rfl: 4   Iron-FA-B Cmp-C-Biot-Probiotic (FUSION PLUS) CAPS, Take 1 capsule by mouth daily. (Patient not taking: Reported on 02/22/2023), Disp: 30 capsule, Rfl: 5   lipase/protease/amylase (CREON) 36000 UNITS CPEP capsule, Take 2 capsules with the first bite of each meal and 1 capsule with the first bite of each snack (Patient not taking: Reported on 02/22/2023), Disp: 240 capsule, Rfl: 5   valACYclovir (VALTREX) 1000 MG tablet, 2 tablets twice a day for 1 day as needed for cold sores (Patient not taking: Reported on 02/22/2023), Disp: 12 tablet, Rfl: 2   Physical exam:  Vitals:   02/22/23 1350  BP: (!) 139/58  Pulse: 85  Resp: 18  Temp: (!) 96.9 F (36.1 C)  TempSrc: Tympanic  SpO2: 100%  Weight: 141 lb 12.8 oz (64.3 kg)  Height: 5\' 6"  (1.676 m)   Physical Exam Constitutional:      Comments: Sitting in a wheelchair.  Appears  frail and fatigued  Cardiovascular:     Rate and Rhythm: Normal rate and regular rhythm.     Heart sounds: Normal heart sounds.  Pulmonary:     Effort: Pulmonary effort is normal.     Comments: Breath sounds decreased bilaterally diffusely Abdominal:     General: Bowel sounds are normal.     Palpations: Abdomen is soft.  Skin:    General: Skin is warm and dry.  Neurological:     Mental Status: She is alert and oriented to person, place, and time.            Latest Ref Rng & Units 01/28/2023    9:18 AM  CMP  Glucose 70 - 99 mg/dL 413   BUN 8 - 27 mg/dL 39   Creatinine 2.44 - 1.00 mg/dL 0.10   Sodium 272 - 536 mmol/L 139   Potassium 3.5 - 5.2 mmol/L 5.4   Chloride 96 - 106 mmol/L 104   CO2 20 - 29 mmol/L 22   Calcium 8.7 - 10.3 mg/dL 9.3       Latest Ref Rng & Units 01/28/2023    9:18 AM  CBC  WBC 3.4 - 10.8 x10E3/uL 13.1   Hemoglobin 11.1 - 15.9 g/dL 9.2   Hematocrit 64.4 - 46.6 % 29.5   Platelets 150 - 450 x10E3/uL 275     No images are attached to the encounter.  DG Chest 2 View  Result Date: 02/22/2023 CLINICAL DATA:  Pneumonia, COPD exacerbation EXAM: CHEST - 2 VIEW COMPARISON:  01/12/2023. FINDINGS: Unremarkable cardiac silhouette. Small left-sided pleural effusion. Nodular lesion right lower lung measuring about 1.3 cm. Left mid hemithorax alveolar process consistent with consolidation or mass. Interstitial changes with nodularity left base. This could represent infectious process, but other etiologies including lymphangitic tumor should also be considered. Right paratracheal soft tissue fullness noted which could be due to adenopathy. These findings can be assessed further with contrast-enhanced CT. IMPRESSION: Left mid lung mass versus consolidation. Infection versus lymphangitic tumor. Possible right paratracheal adenopathy. Right base mass nodule. Electronically Signed   By: Layla Maw M.D.   On: 02/22/2023 08:48   US THORACENTESIS ASP PLEURAL SPACE W/IMG GUIDE  Result Date: 02/05/2023 INDICATION: Left pleural effusion. Request received for diagnostic thoracentesis. EXAM: ULTRASOUND GUIDED LEFT THORACENTESIS MEDICATIONS: 8 cc 1% lidocaine COMPLICATIONS: None immediate. PROCEDURE: An ultrasound guided thoracentesis was thoroughly discussed with the patient and questions answered. The benefits, risks, alternatives and complications were also discussed. The patient understands and wishes to proceed with the procedure. Written  consent was obtained. Ultrasound was performed to localize and mark an adequate pocket of fluid in the left chest. The area was then prepped and draped in the normal sterile fashion. 1% Lidocaine was used for local anesthesia. Under ultrasound guidance a 6 Fr Safe-T-Centesis catheter was introduced. Thoracentesis was performed. The catheter was removed and a dressing applied. FINDINGS: A total of approximately 250 mL of dark yellow fluid was removed. Samples were sent to the laboratory as requested by the clinical team. IMPRESSION: Successful ultrasound guided left thoracentesis yielding 250 mL of pleural fluid. Follow-up chest x-ray revealed no evidence of pneumothorax. Procedure performed by Mina Marble, PA-C Electronically Signed   By: Olive Bass M.D.   On: 02/05/2023 14:14   CT CHEST WO CONTRAST  Result Date: 02/05/2023 CLINICAL DATA:  Pleural effusion, concern for underlying malignancy; * Tracking Code: BO * EXAM: CT CHEST WITHOUT CONTRAST TECHNIQUE: Multidetector CT imaging of the chest was performed following  the standard protocol without IV contrast. RADIATION DOSE REDUCTION: This exam was performed according to the departmental dose-optimization program which includes automated exposure control, adjustment of the mA and/or kV according to patient size and/or use of iterative reconstruction technique. COMPARISON:  Chest CT dated August 21, 2020 FINDINGS: Cardiovascular: Normal heart size. No pericardial effusion. Aortic valve calcifications. Normal caliber thoracic aorta with moderate atherosclerotic disease. Severe coronary artery calcifications. Mediastinum/Nodes: Esophagus and thyroid are unremarkable. Enlarged mediastinal, left hilar and left supraclavicular lymph nodes. Reference left paratracheal lymph node measuring 1.9 cm in short axis on series 2 image 49. Reference right paratracheal lymph node measuring 1.4 cm in short axis on image 49. Reference left supraclavicular lymph node measuring  10 mm on series 2, image 4. Lungs/Pleura: Large left lower lobe mass which extends into the left hilum and left upper lobe, measuring 6.7 x 5.4 cm on series 2, image 46. Occlusion of the left upper lobe bronchus and narrowing of the left lower lobe bronchus. Adjacent ground-glass opacities and septal thickening seen in the left upper and lower lobes. Trace left pleural effusion and pleural nodularity. Reference left pleural lymph node measuring 9 mm on series 2, image 122. Irregular linear consolidations of the right lower lobe and right middle lobe, likely due to scarring. Scattered small solid pulmonary nodules seen throughout the lungs which are new when compared with prior chest CT. Reference solid nodule of the right upper lobe measuring 4 mm on series 4, image 52. Mild centrilobular emphysema. Upper Abdomen: Prior cholecystectomy. Musculoskeletal: Chronic left-sided rib fractures. No aggressive appearing osseous lesions. IMPRESSION: 1. Large left lower lobe mass which extends into the left hilum and left upper lobe, measuring up to 6.7 cm, consistent with primary lung malignancy. 2. Enlarged mediastinal, left hilar and left supraclavicular lymph nodes, consistent with nodal metastatic disease. 3. Trace left pleural effusion and pleural nodularity, concerning for pleural metastatic disease. 4. Scattered small solid pulmonary nodules seen throughout the lungs which are new when compared with prior chest CT, concerning for metastatic disease. 5. Coronary artery calcifications, aortic Atherosclerosis (ICD10-I70.0) and Emphysema (ICD10-J43.9). Electronically Signed   By: Allegra Lai M.D.   On: 02/05/2023 11:08   DG Chest Port 1 View  Result Date: 02/05/2023 CLINICAL DATA:  Left pleural effusion status post thoracentesis. EXAM: PORTABLE CHEST 1 VIEW COMPARISON:  Chest radiograph 02/04/2023. FINDINGS: Decreased left pleural effusion with unchanged streaky opacities in the left lung with associated  interstitial prominence. Attention on subsequently performed chest CT. Nodular opacity along the right minor fissure. Stable cardiac and mediastinal contours. No pneumothorax. IMPRESSION: 1. Decreased left pleural effusion status post thoracentesis. No pneumothorax. 2. Unchanged streaky opacities in the left lung with associated interstitial prominence. Attention on subsequently performed chest CT. Electronically Signed   By: Orvan Falconer M.D.   On: 02/05/2023 09:56    Assessment and plan- Patient is a 82 y.o. female with newly diagnosed stage IV adenocarcinoma of the lung  Discussed with patient and the family that given the fact that she has bilateral lung nodules as well as cytology from the pleural fluid that is consistent with lung cancer this constitutes stage IV disease.  Treatment would be palliative and not curative.  Ideally for all adenocarcinomas we need to get NGS testing to rule out any actionable mutations which could be tolerated with oral agents.  It appears that patient has undergone peripheral blood circular gene testing by pulmonary.  However results are more accurate with tissue testing.  Moreover we  also need results of PD-L1 testing to determine if patient would benefit from immunotherapy alone especially if PD-L1 is more than 50% and there is not enough sample from the pleural fluid to give [place] these answers.  Options for stage IV lung cancer treatment include targeted therapy versus single agent immunotherapy versus chemoimmunotherapy.  Patient is not inclined towards getting chemotherapy but she would like to think about her options and get back to [place].  If patient is interested in exploring options for targeted therapy or immunotherapy will need repeat biopsy which we can obtain with ultrasound-guided supraclavicular lymph node biopsy.  The likelihood of having targetable mutations is low given her longstanding history of smoking, obtaining PD-L1 would still be an important piece  of information as immunotherapy can be well-tolerated in the elderly.  Discussed risks and benefits of immunotherapy including all but not limited to autoimmune side effect such as autoimmune colitis hepatitis pneumonitis and other endocrinopathies.  Discussed acetaminophens of chemotherapy including all but not limited to nausea vomiting low blood counts, risk of infections and hospitalizations.  Treatment is typically given every 3 weeks for 4 cycles followed by repeat scans.  Immunotherapy is generally continued following that until progression or toxicity.    We will reach out to the patient next week after she makes her decision.  Results of PET scan and MRI brain are pending at the time of my visit.   Cancer Staging  Adenocarcinoma of left lung [facility]) Staging form: Lung, AJCC 8th Edition - Clinical stage from 02/28/2023: Stage IVB (cT4, cN3, pM1c) - Signed by Creig Hines, MD on 02/28/2023     Thank you for this kind referral and the opportunity to participate in the care of this patient   Visit Diagnosis 1. Adenocarcinoma of left lung (HCC)   2. Goals of care, counseling/discussion     Dr. Owens Shark, MD, MPH [facility] at [facility] [phone] 02/22/2023

## 2023-02-23 NOTE — Telephone Encounter (Signed)
I would go ahead and take the flu vaccine.  Hold off on COVID-vaccine for now.

## 2023-02-24 ENCOUNTER — Encounter: Payer: Self-pay | Admitting: *Deleted

## 2023-02-24 MED ORDER — ALPRAZOLAM 0.25 MG PO TABS: 0.2500 mg | ORAL_TABLET | Freq: Three times a day (TID) | ORAL | 0 refills | Status: DC | PRN

## 2023-02-24 NOTE — Progress Notes (Signed)
Phone call made to patient's daughter to update per Dr. Smith Robert that repeat biopsy is required for molecular testing. Per Dr. Archer Asa, right supraclavicular lymph node is accessible for biopsy if pt is in agreement with pursuing further work up. Pt's daughter stated that they will be having a family meeting and will let me know her decision after further discussing and reviewing all options. Contact info given to pt's daughter and encouraged to let me know her decision this week or latest Monday 10/14. All questions answered during call. Nothing further needed at this time.

## 2023-02-28 ENCOUNTER — Encounter: Payer: Self-pay | Admitting: Oncology

## 2023-02-28 DIAGNOSIS — C3492 Malignant neoplasm of unspecified part of left bronchus or lung: Secondary | ICD-10-CM | POA: Insufficient documentation

## 2023-03-01 ENCOUNTER — Ambulatory Visit: Payer: Medicare Other

## 2023-03-01 ENCOUNTER — Encounter: Payer: Self-pay | Admitting: *Deleted

## 2023-03-01 ENCOUNTER — Other Ambulatory Visit: Payer: Self-pay | Admitting: *Deleted

## 2023-03-01 DIAGNOSIS — C3492 Malignant neoplasm of unspecified part of left bronchus or lung: Secondary | ICD-10-CM

## 2023-03-01 MED ORDER — OXYCODONE HCL 5 MG PO TABS: 5.0000 mg | ORAL_TABLET | ORAL | 0 refills | Status: DC | PRN

## 2023-03-01 MED ORDER — LORAZEPAM 0.5 MG PO TABS: 0.5000 mg | ORAL_TABLET | Freq: Three times a day (TID) | ORAL | 0 refills | Status: DC | PRN

## 2023-03-01 NOTE — Progress Notes (Signed)
Received call from pt's daughter, Santina Evans, stating that pt does not wish to pursue any further treatment or workup for her lung cancer. Pt states would like referral to hospice at this time. Hospice referral entered and Dr. Smith Robert made aware. Instructed pt's daughter to call with any questions or needs. Santina Evans verbalized understanding.

## 2023-03-03 ENCOUNTER — Encounter: Payer: Self-pay | Admitting: Pulmonary Disease

## 2023-03-03 NOTE — Telephone Encounter (Signed)
That is understandable.  Yes we can cancel her appointment.  I had spoken to [facility] at the cancer center and she confirmed that she has opted for comfort measures.

## 2023-03-03 NOTE — Telephone Encounter (Signed)
Dr. Jayme Cloud, are you ok with me canceling the appointment?

## 2023-03-15 ENCOUNTER — Other Ambulatory Visit: Payer: Self-pay | Admitting: Family Medicine

## 2023-03-15 DIAGNOSIS — F331 Major depressive disorder, recurrent, moderate: Secondary | ICD-10-CM

## 2023-03-16 NOTE — Telephone Encounter (Signed)
Labs in date  Requested Prescriptions  Pending Prescriptions Disp Refills   buPROPion (WELLBUTRIN XL) 300 MG 24 hr tablet [Pharmacy Med Name: buPROPion HCl ER (XL) 300 MG Oral Tablet Extended Release 24 Hour] 90 tablet 3    Sig: TAKE 1 TABLET BY MOUTH DAILY     Psychiatry: Antidepressants - bupropion Failed - 03/15/2023 10:20 PM      Failed - Cr in normal range and within 360 days    Creatinine, Ser  Date Value Ref Range Status  01/28/2023 1.22 (H) 0.57 - 1.00 mg/dL Final   Creatinine, POC  Date Value Ref Range Status  07/13/2016 n/a mg/dL Final         Passed - AST in normal range and within 360 days    AST  Date Value Ref Range Status  06/01/2022 18 0 - 40 IU/L Final         Passed - ALT in normal range and within 360 days    ALT  Date Value Ref Range Status  06/01/2022 20 0 - 32 IU/L Final         Passed - Completed PHQ-2 or PHQ-9 in the last 360 days      Passed - Last BP in normal range    BP Readings from Last 1 Encounters:  02/22/23 (!) 139/58         Passed - Valid encounter within last 6 months    Recent Outpatient Visits           1 month ago Type 2 diabetes mellitus with diabetic neuropathy, without long-term current use of insulin (HCC)   [place] [facility] Malva Limes, MD   2 months ago Chronic obstructive pulmonary disease with acute exacerbation [facility])   [place] [facility], Monico Blitz, DO   6 months ago Type 2 diabetes mellitus with diabetic neuropathy, without long-term current use of insulin (HCC)   [place] [facility] Malva Limes, MD   9 months ago Type 2 diabetes mellitus with diabetic neuropathy, without long-term current use of insulin (HCC)   [place] [facility] Malva Limes, MD   1 year ago Type 2 diabetes mellitus with diabetic neuropathy, without long-term current use of insulin (HCC)   [place] [facility] Malva Limes, MD        Future Appointments             In 2 months Fisher, Demetrios Isaacs, MD [facility]   In 3 months Deirdre Evener, MD [facility]

## 2023-03-22 ENCOUNTER — Telehealth: Payer: Self-pay

## 2023-03-22 NOTE — Telephone Encounter (Signed)
Patient Erica Rivas patient assistance is expiring in 05/18/2023. Patient daughter catherine answerer and states her mom has been diagnosed with stage 4 cancer and they have called in hospice. She states she does not think she will be taking this medication anymore. Informed her I would let Dr. Allegra Lai know and if they needed anything from [place] to let us know

## 2023-03-23 ENCOUNTER — Ambulatory Visit: Payer: Medicare Other | Admitting: Pulmonary Disease

## 2023-03-26 LAB — ACID FAST CULTURE WITH REFLEXED SENSITIVITIES (MYCOBACTERIA): Acid Fast Culture: NEGATIVE

## 2023-03-31 ENCOUNTER — Encounter: Payer: Self-pay | Admitting: Pulmonary Disease

## 2023-04-04 ENCOUNTER — Other Ambulatory Visit: Payer: Self-pay | Admitting: Family Medicine

## 2023-04-18 DEATH — deceased

## 2023-05-31 ENCOUNTER — Ambulatory Visit: Payer: Medicare Other | Admitting: Family Medicine

## 2023-06-14 ENCOUNTER — Ambulatory Visit: Payer: Medicare Other | Admitting: Dermatology

## 2023-06-15 ENCOUNTER — Ambulatory Visit: Payer: Medicare Other | Admitting: Dermatology

## 2023-09-06 ENCOUNTER — Encounter (INDEPENDENT_AMBULATORY_CARE_PROVIDER_SITE_OTHER): Payer: Medicare Other | Admitting: Ophthalmology

## 2023-09-21 ENCOUNTER — Ambulatory Visit (INDEPENDENT_AMBULATORY_CARE_PROVIDER_SITE_OTHER): Payer: Medicare Other | Admitting: Vascular Surgery

## 2023-09-21 ENCOUNTER — Encounter (INDEPENDENT_AMBULATORY_CARE_PROVIDER_SITE_OTHER): Payer: Medicare Other
